# Patient Record
Sex: Male | Born: 1950 | Race: White | Hispanic: No | Marital: Married | Smoking: Never smoker
Health system: Southern US, Community
[De-identification: ages and names within clinical notes are randomized; demographics above are authoritative.]

## PROBLEM LIST (undated history)

## (undated) DIAGNOSIS — C4491 Basal cell carcinoma of skin, unspecified: Secondary | ICD-10-CM

## (undated) DIAGNOSIS — I2510 Atherosclerotic heart disease of native coronary artery without angina pectoris: Secondary | ICD-10-CM

## (undated) DIAGNOSIS — K219 Gastro-esophageal reflux disease without esophagitis: Secondary | ICD-10-CM

## (undated) DIAGNOSIS — Z87898 Personal history of other specified conditions: Secondary | ICD-10-CM

## (undated) DIAGNOSIS — R112 Nausea with vomiting, unspecified: Secondary | ICD-10-CM

## (undated) DIAGNOSIS — N281 Cyst of kidney, acquired: Secondary | ICD-10-CM

## (undated) DIAGNOSIS — M1990 Unspecified osteoarthritis, unspecified site: Secondary | ICD-10-CM

## (undated) DIAGNOSIS — I10 Essential (primary) hypertension: Secondary | ICD-10-CM

## (undated) DIAGNOSIS — I712 Thoracic aortic aneurysm, without rupture, unspecified: Secondary | ICD-10-CM

## (undated) DIAGNOSIS — D126 Benign neoplasm of colon, unspecified: Secondary | ICD-10-CM

## (undated) DIAGNOSIS — G4730 Sleep apnea, unspecified: Secondary | ICD-10-CM

## (undated) DIAGNOSIS — E119 Type 2 diabetes mellitus without complications: Secondary | ICD-10-CM

## (undated) DIAGNOSIS — K648 Other hemorrhoids: Secondary | ICD-10-CM

## (undated) DIAGNOSIS — K649 Unspecified hemorrhoids: Secondary | ICD-10-CM

## (undated) DIAGNOSIS — M545 Low back pain, unspecified: Secondary | ICD-10-CM

## (undated) DIAGNOSIS — N201 Calculus of ureter: Secondary | ICD-10-CM

## (undated) DIAGNOSIS — Z9889 Other specified postprocedural states: Secondary | ICD-10-CM

## (undated) DIAGNOSIS — E785 Hyperlipidemia, unspecified: Secondary | ICD-10-CM

## (undated) DIAGNOSIS — J189 Pneumonia, unspecified organism: Secondary | ICD-10-CM

## (undated) DIAGNOSIS — M5136 Other intervertebral disc degeneration, lumbar region: Secondary | ICD-10-CM

## (undated) DIAGNOSIS — J302 Other seasonal allergic rhinitis: Secondary | ICD-10-CM

## (undated) DIAGNOSIS — L02215 Cutaneous abscess of perineum: Secondary | ICD-10-CM

## (undated) DIAGNOSIS — R42 Dizziness and giddiness: Secondary | ICD-10-CM

## (undated) DIAGNOSIS — K602 Anal fissure, unspecified: Secondary | ICD-10-CM

## (undated) DIAGNOSIS — M479 Spondylosis, unspecified: Secondary | ICD-10-CM

## (undated) DIAGNOSIS — E669 Obesity, unspecified: Secondary | ICD-10-CM

## (undated) DIAGNOSIS — Z9289 Personal history of other medical treatment: Secondary | ICD-10-CM

## (undated) DIAGNOSIS — M51369 Other intervertebral disc degeneration, lumbar region without mention of lumbar back pain or lower extremity pain: Secondary | ICD-10-CM

## (undated) DIAGNOSIS — H3500 Unspecified background retinopathy: Secondary | ICD-10-CM

## (undated) HISTORY — PX: KNEE ARTHROSCOPY: SUR90

## (undated) HISTORY — DX: Anal fissure, unspecified: K60.2

## (undated) HISTORY — PX: LASIK: SHX215

## (undated) HISTORY — DX: Essential (primary) hypertension: I10

## (undated) HISTORY — DX: Personal history of other medical treatment: Z92.89

## (undated) HISTORY — DX: Thoracic aortic aneurysm, without rupture, unspecified: I71.20

## (undated) HISTORY — DX: Benign neoplasm of colon, unspecified: D12.6

## (undated) HISTORY — DX: Unspecified osteoarthritis, unspecified site: M19.90

## (undated) HISTORY — DX: Hyperlipidemia, unspecified: E78.5

## (undated) HISTORY — PX: COLONOSCOPY: SHX174

## (undated) HISTORY — DX: Obesity, unspecified: E66.9

## (undated) HISTORY — DX: Type 2 diabetes mellitus without complications: E11.9

## (undated) HISTORY — DX: Gastro-esophageal reflux disease without esophagitis: K21.9

## (undated) HISTORY — DX: Other hemorrhoids: K64.8

## (undated) HISTORY — DX: Atherosclerotic heart disease of native coronary artery without angina pectoris: I25.10

## (undated) HISTORY — DX: Thoracic aortic aneurysm, without rupture: I71.2

## (undated) HISTORY — DX: Sleep apnea, unspecified: G47.30

---

## 1979-03-12 HISTORY — PX: OTHER SURGICAL HISTORY: SHX169

## 1988-03-11 HISTORY — PX: LUMBAR SPINE SURGERY: SHX701

## 1997-10-06 ENCOUNTER — Ambulatory Visit (HOSPITAL_BASED_OUTPATIENT_CLINIC_OR_DEPARTMENT_OTHER): Admission: RE | Admit: 1997-10-06 | Discharge: 1997-10-06 | Payer: Self-pay | Admitting: *Deleted

## 1998-02-09 ENCOUNTER — Ambulatory Visit (HOSPITAL_BASED_OUTPATIENT_CLINIC_OR_DEPARTMENT_OTHER): Admission: RE | Admit: 1998-02-09 | Discharge: 1998-02-09 | Payer: Self-pay | Admitting: *Deleted

## 1998-06-05 ENCOUNTER — Ambulatory Visit (HOSPITAL_BASED_OUTPATIENT_CLINIC_OR_DEPARTMENT_OTHER): Admission: RE | Admit: 1998-06-05 | Discharge: 1998-06-05 | Payer: Self-pay | Admitting: *Deleted

## 1999-03-12 HISTORY — PX: NASAL SINUS SURGERY: SHX719

## 1999-06-07 ENCOUNTER — Ambulatory Visit: Admission: RE | Admit: 1999-06-07 | Discharge: 1999-06-07 | Payer: Self-pay | Admitting: Internal Medicine

## 2000-02-02 ENCOUNTER — Emergency Department (HOSPITAL_COMMUNITY): Admission: EM | Admit: 2000-02-02 | Discharge: 2000-02-02 | Payer: Self-pay | Admitting: Emergency Medicine

## 2000-02-02 ENCOUNTER — Encounter: Payer: Self-pay | Admitting: Emergency Medicine

## 2000-03-11 HISTORY — PX: NASAL SINUS SURGERY: SHX719

## 2002-01-11 ENCOUNTER — Ambulatory Visit (HOSPITAL_COMMUNITY): Admission: RE | Admit: 2002-01-11 | Discharge: 2002-01-11 | Payer: Self-pay | Admitting: Cardiology

## 2002-03-11 HISTORY — PX: CARDIAC CATHETERIZATION: SHX172

## 2005-09-08 DIAGNOSIS — J189 Pneumonia, unspecified organism: Secondary | ICD-10-CM

## 2005-09-08 HISTORY — DX: Pneumonia, unspecified organism: J18.9

## 2005-09-20 ENCOUNTER — Encounter (INDEPENDENT_AMBULATORY_CARE_PROVIDER_SITE_OTHER): Payer: Self-pay | Admitting: *Deleted

## 2005-09-20 ENCOUNTER — Emergency Department (HOSPITAL_COMMUNITY): Admission: EM | Admit: 2005-09-20 | Discharge: 2005-09-20 | Payer: Self-pay | Admitting: Emergency Medicine

## 2005-10-09 ENCOUNTER — Encounter (INDEPENDENT_AMBULATORY_CARE_PROVIDER_SITE_OTHER): Payer: Self-pay | Admitting: *Deleted

## 2005-10-09 ENCOUNTER — Encounter: Admission: RE | Admit: 2005-10-09 | Discharge: 2005-10-09 | Payer: Self-pay | Admitting: Cardiology

## 2009-12-27 ENCOUNTER — Encounter: Payer: Self-pay | Admitting: Internal Medicine

## 2009-12-28 ENCOUNTER — Ambulatory Visit: Payer: Self-pay | Admitting: Cardiology

## 2010-01-17 ENCOUNTER — Encounter: Payer: Self-pay | Admitting: Internal Medicine

## 2010-01-17 ENCOUNTER — Telehealth: Payer: Self-pay | Admitting: Internal Medicine

## 2010-01-31 ENCOUNTER — Ambulatory Visit: Payer: Self-pay | Admitting: Internal Medicine

## 2010-01-31 DIAGNOSIS — I2510 Atherosclerotic heart disease of native coronary artery without angina pectoris: Secondary | ICD-10-CM

## 2010-01-31 DIAGNOSIS — E119 Type 2 diabetes mellitus without complications: Secondary | ICD-10-CM

## 2010-02-05 ENCOUNTER — Encounter: Payer: Self-pay | Admitting: Internal Medicine

## 2010-02-15 ENCOUNTER — Ambulatory Visit: Payer: Self-pay | Admitting: Internal Medicine

## 2010-02-17 ENCOUNTER — Encounter: Payer: Self-pay | Admitting: Internal Medicine

## 2010-04-01 ENCOUNTER — Encounter: Payer: Self-pay | Admitting: Cardiology

## 2010-04-10 NOTE — Letter (Signed)
Summary: [facility] Instructions  [facility]  [address] [place], [place] [address]   Phone: [phone]  Fax: [phone]       James Rodgers    16-May-1950    MRN: [id number]        Procedure Day /Date:THURSDAY 02/15/10     Arrival Time:8:00 AM     Procedure Time:9:00 AM     Location of Procedure:                    [facility] ([facility]th [facility])               PREPARATION FOR COLONOSCOPY WITH MOVIPREP   Starting 5 days prior to your procedure 02/10/10 do not eat nuts, seeds, popcorn, corn, beans, peas,  salads, or any raw vegetables.  Do not take any fiber supplements (e.g. Metamucil, Citrucel, and Benefiber).  THE DAY BEFORE YOUR PROCEDURE         DATE:02/14/10 ONG:[id number]  1.  Drink clear liquids the entire day-NO SOLID FOOD  2.  Do not drink anything colored red or purple.  Avoid juices with pulp.  No orange juice.  3.  Drink at least 64 oz. (8 glasses) of fluid/clear liquids during the day to prevent dehydration and help the prep work efficiently.  CLEAR LIQUIDS INCLUDE: Water Jello Ice Popsicles Tea (sugar ok, no milk/cream) Powdered fruit flavored drinks Coffee (sugar ok, no milk/cream) Gatorade Juice: apple, white grape, white cranberry  Lemonade Clear bullion, consomm, broth Carbonated beverages (any kind) Strained chicken noodle soup Hard Candy                             4.  In the morning, mix first dose of MoviPrep solution:    Empty 1 Pouch A and 1 Pouch B into the disposable container    Add lukewarm drinking water to the top line of the container. Mix to dissolve    Refrigerate (mixed solution should be used within 24 hrs)  5.  Begin drinking the prep at 5:00 p.m. The MoviPrep container is divided by 4 marks.   Every 15 minutes drink the solution down to the next mark (approximately 8 oz) until the full liter is complete.   6.  Follow completed prep with 16 oz of clear liquid of your choice (Nothing red or  purple).  Continue to drink clear liquids until bedtime.  7.  Before going to bed, mix second dose of MoviPrep solution:    Empty 1 Pouch A and 1 Pouch B into the disposable container    Add lukewarm drinking water to the top line of the container. Mix to dissolve    Refrigerate  THE DAY OF YOUR PROCEDURE      DATE: 02/15/10 DAY: THURSDAY  Beginning at 4:00 a.m. (5 hours before procedure):         1. Every 15 minutes, drink the solution down to the next mark (approx 8 oz) until the full liter is complete.  2. Follow completed prep with 16 oz. of clear liquid of your choice.    3. You may drink clear liquids until 7:00 AM (2 HOURS BEFORE PROCEDURE).   MEDICATION INSTRUCTIONS  Unless otherwise instructed, you should take regular prescription medications with a small sip of water   as early as possible the morning of your procedure.  Diabetic patients - see separate instructions.  Stop taking Plavix  on 03/02/10 (5days  before procedure).            OTHER INSTRUCTIONS  You will need a responsible adult at least 61 years of age to accompany you and drive you home.   This person must remain in the waiting room during your procedure.  Wear loose fitting clothing that is easily removed.  Leave jewelry and other valuables at home.  However, you may wish to bring a book to read or  an iPod/MP3 player to listen to music as you wait for your procedure to start.  Remove all body piercing jewelry and leave at home.  Total time from sign-in until discharge is approximately 2-3 hours.  You should go home directly after your procedure and rest.  You can resume normal activities the  day after your procedure.  The day of your procedure you should not:   Drive   Make legal decisions   Operate machinery   Drink alcohol   Return to work  You will receive specific instructions about eating, activities and medications before you leave.    The above instructions have been  reviewed and explained to me by   _______________________    I fully understand and can verbalize these instructions _____________________________ Date _________

## 2010-04-10 NOTE — Letter (Signed)
Summary: New Patient letter  [facility]  [address] [place], [place] [address]   Phone: [phone]  Fax: [phone]       01/17/2010 MRN: [id number]  [facility] [address] [place], [place]  [address]  Dear Mr. SIMONIAN,  Welcome to the Gastroenterology Division at [facility].    You are scheduled to see Dr. Marina Goodell on 01/31/10 at 8:45 am on the 3rd floor at [facility], [address]  We ask that you try to arrive at our office 15 minutes prior to your appointment time to allow for check-in.  We would like you to complete the enclosed self-administered evaluation form prior to your visit and bring it with you on the day of your appointment.  We will review it with you.  Also, please bring a complete list of all your medications or, if you prefer, bring the medication bottles and we will list them.  Please bring your insurance card so that we may make a copy of it.  If your insurance requires a referral to see a specialist, please bring your referral form from your primary care physician.  Co-payments are due at the time of your visit and may be paid by cash, check or credit card.     Your office visit will consist of a consult with your physician (includes a physical exam), any laboratory testing he/she may order, scheduling of any necessary diagnostic testing (e.g. x-ray, ultrasound, CT-scan), and scheduling of a procedure (e.g. Endoscopy, Colonoscopy) if required.  Please allow enough time on your schedule to allow for any/all of these possibilities.    If you cannot keep your appointment, please call [phone] to cancel or reschedule prior to your appointment date.  This allows [place] the opportunity to schedule an appointment for another patient in need of care.  If you do not cancel or reschedule by 5 p.m. the business day prior to your appointment date, you will be charged a $50.00 late cancellation/no-show fee.    Thank you for  choosing [facility] for your medical needs.  We appreciate the opportunity to care for you.  Please visit [place] at our website  to learn more about our practice.                     Sincerely,                                                             The Gastroenterology Division

## 2010-04-10 NOTE — Letter (Signed)
Summary: Anticoag  Anticoag   Imported By: Lester  02/12/2010 10:05:46  _____________________________________________________________________  External Attachment:    Type:   Image     Comment:   External Document

## 2010-04-10 NOTE — Assessment & Plan Note (Signed)
Summary: Screening colonoscopy (never had one, on Plavix)   History of Present Illness Visit Type: Initial Consult Primary GI MD: Yancey Flemings MD Primary Provider: Jarome Matin, MD Requesting Provider: Jarome Matin, MD Chief Complaint: Consult Colonoscopy, Pt is on Plavix and denies any GI sx. History of Present Illness:   60 year old white male, retired Biochemist, clinical of the [employer], with a history of hypertension, hyperlipidemia, obesity, type 2 diabetes mellitus, sleep apnea, and coronary artery disease. He also has a history of GERD. He presents today regarding screening colonoscopy. Previous sigmoidoscopies with Dr. Idell Pickles reported as negative. No family history of colon cancer. No active GI symptoms. He has been on Plavix for greater than one year after having undergone a cardiac catheterization with coronary artery disease identified. No stent. Also on aspirin. His cardiologist is Dr. Deborah Chalk. Reports prior EGD for GERD. Denies Barrett's. On metformin for diabetes. Review of outside laboratories from May 2011 showed normal Hemoccult testing.   GI Review of Systems    Reports acid reflux.      Denies abdominal pain, belching, bloating, chest pain, dysphagia with liquids, dysphagia with solids, heartburn, loss of appetite, nausea, vomiting, vomiting blood, weight loss, and  weight gain.      Reports hemorrhoids.     Denies anal fissure, black tarry stools, change in bowel habit, constipation, diarrhea, diverticulosis, fecal incontinence, heme positive stool, irritable bowel syndrome, jaundice, light color stool, liver problems, rectal bleeding, and  rectal pain. Preventive Screening-Counseling & Management      Drug Use:  no.      Current Medications (verified): 1)  Plavix 75 Mg Tabs (Clopidogrel Bisulfate) .[name].. 1 By Mouth Once Daily 2)  Norvasc 5 Mg Tabs (Amlodipine Besylate) .[name].. 1 By Mouth Once Daily 3)  Niaspan 1000 Mg Cr-Tabs (Niacin (Antihyperlipidemic)) .... 2  Capsules By Mouth Once Daily 4)  Toprol Xl 100 Mg Xr24h-Tab (Metoprolol Succinate) .... One Tablet By Mouth Once Daily 5)  Benicar 20 Mg Tabs (Olmesartan Medoxomil) .... One Tablet By Mouth Once Daily 6)  Lipitor 20 Mg Tabs (Atorvastatin Calcium) .... One Tablet By Mouth Once Daily 7)  Metformin Hcl 500 Mg Tabs (Metformin Hcl) .... One Tablet By Mouth Once Daily 8)  Nexium 40 Mg Cpdr (Esomeprazole Magnesium) .... One Capsule By Mouth Once Daily 9)  Allegra Allergy 180 Mg Tabs (Fexofenadine Hcl) .... One Capsule By Mouth Once Daily 10)  Aspirin 325 Mg  Tabs (Aspirin) .... One Tablet By Mouth Once Daily  Allergies (verified): No Known Drug Allergies  Past History:  Past Medical History: Coronary Artery Disease Hyperlipidemia Obesity Hypertension Anal Fissure Arthritis Diabetes Type 2 GERD Sleep Apnea  Past Surgical History: Sinus Surgery Back Surgery Anal fissure surgery  Family History: Family History of Heart Disease: Father Family History of Diabetes: Grandmother  Social History: Married Occupation: Fireman Retired Patient has never smoked.  Alcohol Use - no Daily Caffeine Use Illicit Drug Use - no Drug Use:  no  Review of Systems       The patient complains of allergy/sinus, arthritis/joint pain, and itching.  The patient denies anemia, anxiety-new, back pain, blood in urine, breast changes/lumps, change in vision, confusion, cough, coughing up blood, depression-new, fainting, fatigue, fever, headaches-new, hearing problems, heart murmur, heart rhythm changes, menstrual pain, muscle pains/cramps, night sweats, nosebleeds, pregnancy symptoms, shortness of breath, skin rash, sleeping problems, sore throat, swelling of feet/legs, swollen lymph glands, thirst - excessive , urination - excessive , urination changes/pain, urine leakage, vision changes, and voice change.  Vital Signs:  Patient profile:   60 year old male Height:      77 inches Weight:      275.38  pounds BMI:     32.77 Pulse rate:   80 / minute Pulse rhythm:   regular BP sitting:   132 / 72  (right arm) Cuff size:   regular  Vitals Entered By: Christie Nottingham CMA Duncan Dull) (January 31, 2010 8:51 AM)  Physical Exam  General:  Well developed, well nourished, no acute distress. Head:  Normocephalic and atraumatic. Eyes:  PERRLA, no icterus. Nose:  No deformity, discharge,  or lesions. Mouth:  No deformity or lesions, dentition normal. Neck:  Supple; no masses or thyromegaly. Lungs:  Clear throughout to auscultation. Heart:  Regular rate and rhythm; no murmurs, rubs,  or bruits. Abdomen:  Soft, nontender and nondistended. No masses, hepatosplenomegaly or hernias noted. Normal bowel sounds. Rectal:  deferred until colonoscopy Prostate:  deferred until colonoscopy Msk:  Symmetrical with no gross deformities. Normal posture. Pulses:  Normal pulses noted. Extremities:  No clubbing, cyanosis, edema or deformities noted. Neurologic:  Alert and  oriented x4;  grossly normal neurologically. Skin:  Intact without significant lesions or rashes. Psych:  Alert and cooperative. Normal mood and affect.   Impression & Recommendations:  Problem # 1:  SPECIAL SCREENING FOR MALIGNANT NEOPLASMS COLON (ICD-V76.51) the patient is an appropriate candidate for screening colonoscopy without contraindication. He is at higher than baseline risk due to his comorbidities including sleep apnea, coronary artery disease requiring Plavix, and diabetes requiring medication adjustments for the exam. The nature of the procedure as well as the risks, benefits, and alternatives were reviewed in detail. He understood and agreed to proceed. Movi prep prescribed. We discussed the pros and cons of proceeding with colonoscopy on Plavix therapy versus holding Plavix therapy. He is interested in holding Plavix therapy, and stent on aspirin, if okay with his cardiologist. We have sent Dr. Deborah Chalk a letter for his review in  this regard. Also, he has been instructed to hold diabetic medications the day of the procedure.  Problem # 2:  CAD (ICD-414.00) we will discuss with the patient's cardiologist feasibility of holding Plavix for approximate 5 days prior to the procedure and as much as 2 weeks thereafter (potentially)  Problem # 3:  DM (ICD-250.00) hold diabetic medications the day of the exam to avoid unwanted hypoglycemia. We will monitor his blood sugar immediately before and after the procedure.  Other Orders: Colonoscopy (Colon)  Patient Instructions: 1)  Colonoscopy [facility] 02/15/10 9:00 am arrive at 8:00 am 2)  Movi prep instructions given to patient. 3)  Movi prep Rx. sent to pharmacy. 4)  Hold Metformin day of procedure. 5)  Hold Plavix x 5 days prior and stay on Aspirin. 6)  We will send Dr. Deborah Chalk a letter to ok this and will contact you once we hear back. 7)  Copy sent to : Jarome Matin, MD  Delfin Edis, MD 8)  The medication list was reviewed and reconciled.  All changed / newly prescribed medications were explained.  A complete medication list was provided to the patient / caregiver. Prescriptions: MOVIPREP 100 GM  SOLR (PEG-KCL-NACL-NASULF-NA ASC-C) As per prep instructions.  #1 x 0   Entered by:   Milford Cage NCMA   Authorized by:   Hilarie Fredrickson MD   Signed by:   Milford Cage NCMA on 01/31/2010   Method used:   Electronically to        [facility] [phone]* (retail)       [address]       [place], [place]  [address]       Ph: [phone] or [phone]       Fax: [phone]   RxID:   [id number]

## 2010-04-10 NOTE — Letter (Signed)
Summary: Diabetic Instructions  [facility]  [address] [place], [place] [address]   Phone: [phone]  Fax: [phone]    James Rodgers [birth year] MRN: [id number]   x  ORAL DIABETIC MEDICATION INSTRUCTIONS  The day before your procedure:   Take your diabetic pill as you do normally  The day of your procedure:   Do not take your diabetic pill    We will check your blood sugar levels during the admission process and again in Recovery before discharging you home  ________________________________________________________________________  _  _   INSULIN (LONG ACTING) MEDICATION INSTRUCTIONS (Lantus, NPH, 70/30, Humulin, Novolin-N)   The day before your procedure:   Take  your regular evening dose    The day of your procedure:   Do not take your morning dose    _  _   INSULIN (SHORT ACTING) MEDICATION INSTRUCTIONS (Regular, Humulog, Novolog)   The day before your procedure:   Do not take your evening dose   The day of your procedure:   Do not take your morning dose   _  _   INSULIN PUMP MEDICATION INSTRUCTIONS  We will contact the physician managing your diabetic care for written dosage instructions for the day before your procedure and the day of your procedure.  Once we have received the instructions, we will contact you.

## 2010-04-10 NOTE — Letter (Signed)
Summary: Anticoagulation Modification Letter  [facility]  [address] [place], [place] [address]   Phone: [phone]  Fax: [phone]    January 31, 2010  Re:    James Rodgers DOB:    1950/11/28 MRN:    [id number]    Dear Dr. Deborah Chalk:  We have scheduled the above patient for a colonoscopy  procedure. Our records show that  he is on anticoagulation therapy. Please advise as to how long the patient may come off their therapy of Plavix prior to the scheduled procedure(s) on 02/15/10.   Please fax back/or route the completed form to James Rodgers, CMA  at [phone].  Thank you for your help with this matter.  Sincerely,    Wilhemina Bonito. Marina Goodell, MD   Physician Recommendation:  Hold Plavix 5 days prior ________________

## 2010-04-10 NOTE — Progress Notes (Signed)
Summary: previsit letter ?'s  Phone Note Call from Patient Call back at Home Phone [phone]   Caller: Patient Call For: Dr. Marina Goodell Reason for Call: Talk to Nurse Summary of Call: previsit letter ?'s Initial call taken by: Vallarie Mare,  January 17, 2010 8:27 AM  Follow-up for Phone Call        Pt. is on Plavix and has never been seen here.  Never has had a colonoscopy.  I cxl'd Previsit appt. and scheduled him to see Dr. Marina Goodell on 01/31/10.  Kept his 02/15/10 appt. for Colonoscopy for now until seen by Dr. Marina Goodell.  Appt. letter mailed to patient. Follow-up by: Milford Cage NCMA,  January 17, 2010 9:46 AM

## 2010-04-10 NOTE — Letter (Signed)
Summary: Pre Visit Letter Revised  [facility]  [address] [place], [place] [address]   Phone: [phone]  Fax: [phone]        12/27/2009 MRN: [id number]  [facility] [address] [place], [place]  [address]             Procedure Date:  12-8 at 9am   Welcome to the Gastroenterology Division at [facility].    You are scheduled to see a nurse for your pre-procedure visit on 02-05-10 at 10am on the 3rd floor at [facility], [address].  We ask that you try to arrive at our office 15 minutes prior to your appointment time to allow for check-in.  Please take a minute to review the attached form.  If you answer "Yes" to one or more of the questions on the first page, we ask that you call the person listed at your earliest opportunity.  If you answer "No" to all of the questions, please complete the rest of the form and bring it to your appointment.    Your nurse visit will consist of discussing your medical and surgical history, your immediate family medical history, and your medications.   If you are unable to list all of your medications on the form, please bring the medication bottles to your appointment and we will list them.  We will need to be aware of both prescribed and over the counter drugs.  We will need to know exact dosage information as well.    Please be prepared to read and sign documents such as consent forms, a financial agreement, and acknowledgement forms.  If necessary, and with your consent, a friend or relative is welcome to sit-in on the nurse visit with you.  Please bring your insurance card so that we may make a copy of it.  If your insurance requires a referral to see a specialist, please bring your referral form from your primary care physician.  No co-pay is required for this nurse visit.     If you cannot keep your appointment, please call [phone] to cancel or reschedule prior to your appointment date.   This allows [place] the opportunity to schedule an appointment for another patient in need of care.    Thank you for choosing [facility] for your medical needs.  We appreciate the opportunity to care for you.  Please visit [place] at our website  to learn more about our practice.  Sincerely, The Gastroenterology Division

## 2010-04-12 NOTE — Procedures (Signed)
Summary: Colonoscopy  Patient: James Rodgers Note: All result statuses are Final unless otherwise noted.  Tests: (1) Colonoscopy (COL)   COL Colonoscopy           DONE     [facility]     [address]     [place], [place]  [address]           COLONOSCOPY PROCEDURE REPORT           PATIENT:  James Rodgers, James Rodgers  MR#:  [id number]     BIRTHDATE:  1950/10/29, 59 yrs. old  GENDER:  male     ENDOSCOPIST:  Wilhemina Bonito. Eda Keys, MD     REF. BY:  Jarome Matin, M.D.     PROCEDURE DATE:  02/15/2010     PROCEDURE:  Colonoscopy with snare polypectomy x 4     ASA CLASS:  Class II     INDICATIONS:  Routine Risk Screening     MEDICATIONS:   Fentanyl 100 mcg IV, Versed 10 mg IV, Benadryl 25     mg IV           DESCRIPTION OF PROCEDURE:   After the risks benefits and     alternatives of the procedure were thoroughly explained, informed     consent was obtained.  Digital rectal exam was performed and     revealed no abnormalities.   The LB [id number] endoscope     was introduced through the anus and advanced to the cecum, which     was identified by both the appendix and ileocecal valve, without     limitations.Time to cecum = 5:00 min. The quality of the prep was     good, using MoviPrep.  The instrument was then slowly withdrawn     (time = 22:07 min) as the colon was fully examined.     <<PROCEDUREIMAGES>>           FINDINGS:  Four polyps were found - 1mm in the cecum, 55mm,5mm in     ascending, and 3mm in descending colon. Polyps were snared without     cautery. Retrieval was successful.   Moderate diverticulosis was     found throughout the colon.   Retroflexed views in the rectum     revealed internal hemorrhoids.    The scope was then withdrawn     from the patient and the procedure completed.           COMPLICATIONS:  None     ENDOSCOPIC IMPRESSION:     1) Four polyps - removed     2) Moderate diverticulosis throughout the colon     3) Internal hemorrhoids]           RECOMMENDATIONS:     1) Follow up colonoscopy in 3 years if 3 or more adenomas;     otherwise  5 years           ______________________________     Wilhemina Bonito. Eda Keys, MD           CC:  Jarome Matin, MD; The Patient           n.     eSIGNED:   Wilhemina Bonito. Eda Keys at 02/15/2010 10:17 AM           [place], [place], [id number]  Note: An exclamation mark (!) indicates a result that was not dispersed into the flowsheet. Document Creation Date: 02/15/2010 10:17 AM _______________________________________________________________________  (1) Order result status: Final Collection  or observation date-time: 02/15/2010 10:04 Requested date-time:  Receipt date-time:  Reported date-time:  Referring Physician:   Ordering Physician: Fransico Setters [phone]) Specimen Source:  Source: Launa Grill Order Number: [id number] Lab site:   Appended Document: Colonoscopy recall 5 yrs     Procedures Next Due Date:    Colonoscopy: 02/2015

## 2010-04-12 NOTE — Letter (Signed)
Summary: Patient Notice- Polyp Results   G[facility]enter[facility]  [address] [place], [place] [address]   Phone: [phone]  Fax: [phone]        February 17, 2010 MRN: [id number]    James Rodgers [address] [place], [place]  [address]    Dear [name],  I am pleased to inform you that the colon polyp(s) removed during your recent colonoscopy was (were) found to be benign (no cancer detected) upon pathologic examination.  I recommend you have a repeat colonoscopy examination in 5 years to look for recurrent polyps, as having colon polyps increases your risk for having recurrent polyps or even colon cancer in the future.  Should you develop new or worsening symptoms of abdominal pain, bowel habit changes or bleeding from the rectum or bowels, please schedule an evaluation with either your primary care physician or with me.  Additional information/recommendations:  __ No further action with gastroenterology is needed at this time. Please      follow-up with your primary care physician for your other healthcare      needs.   Please call us if you are having persistent problems or have questions about your condition that have not been fully answered at this time.  Sincerely,  Hilarie Fredrickson MD  This letter has been electronically signed by your physician.  Appended Document: Patient Notice- Polyp Results Letter mailed

## 2010-05-22 LAB — GLUCOSE, CAPILLARY
Glucose-Capillary: 124 mg/dL — ABNORMAL HIGH (ref 70–99)
Glucose-Capillary: 140 mg/dL — ABNORMAL HIGH (ref 70–99)

## 2010-07-27 NOTE — Consult Note (Signed)
James Rodgers, James Rodgers            ACCOUNT NO.:  [id number]   MEDICAL RECORD NO.:  [id number]          PATIENT TYPE:  EMS   LOCATION:  MAJO                         FACILITY:  [facility]   PHYSICIAN:  Colleen Can. 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  Diarrhea began on  July 3rd and July 4th.  He had persistence of that up to 3-4 stools per day  and another colleague had diarrhea for 2 days.  He initially was felt to be  better but was seen in urgent medical clinic and given Cipro.  Initially he  was better the next day and then yesterday had temperatures to 102 with low  back pain and increasing indigestion.  He has had no diarrhea the last 2  days.  He has known coronary artery disease with a cath in 2003 showing 50-  60% stenosis of the LAD.  His last stress Cardiolite study in February of  2006 with ejection fraction of 57% with no ischemia.  He has had lipid  abnormality, obesity and history of glucose intolerance.   CURRENT MEDICATIONS:  1.  Folic acid.  2.  Plavix 75 a day.  3.  Nexium.  4.  Altace.  5.  Lipitor.  6.  Toprol.  7.  Claritin.  8.  Nasonex.   ALLERGIES:  None.   PHYSICAL EXAMINATION:  Vital signs: Temperature is 97, blood pressure  120/70, heart rate is 80.  HEENT: Shows he is flushed.  Lungs: Reasonably clear.  Heart: Regular rate and rhythm.  Abdomen: Normal.  Bowel sounds are soft.  No rebound.  No suprapubic  tenderness.  Rectal: Shows some tenderness.  Stool was pending.  Extremities: Without edema.   EKG shows nonspecific ST and T wave changes.   PERTINENT LAB:  White count is 15,000, hematocrit is 45, platelets 139, 82%  segs.  Urinalysis was negative.  Chemistries remarkable for a glucose of  128, BUN 12, calcium and total  protein were normal.  LFTs were normal.   IMPRESSION:  1.  Recent diarrhea now with back pain.  2.  Leukocytosis and recurrent fever in the setting of a course of Cipro.   PLAN:  Will arrange for a CT scan of the abdomen to rule out abscess.  Will  continue the Cipro if there is no abscess.  Otherwise, if there looks like  there is abscess or issues relating to the colon or abdominal visceral, will  need to have surgical consultation.      Colleen Can. Deborah Chalk, M.D.  Electronically Signed     SNT/MEDQ  D:  09/20/2005  T:  09/21/2005  Job:  [id number]

## 2010-07-27 NOTE — H&P (Signed)
NAME:  James Rodgers, James Rodgers                      ACCOUNT NO.:  [id number]   MEDICAL RECORD NO.:  [id number]                   PATIENT TYPE:  OIB   LOCATION:  2858                                 FACILITY:  [facility]   PHYSICIAN:  Colleen Can. Deborah Chalk, M.D.            DATE OF BIRTH:  [birth year]   DATE OF ADMISSION:  01/11/2002  DATE OF DISCHARGE:                                HISTORY & PHYSICAL   DATE OF BIRTH:  02-Mar-1951   HISTORY OF PRESENT ILLNESS:  Mr. 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 He presents with a four day  episode of mid sternal chest pain that has been off and on. It tends to be  somewhat of a dull aching pain and has some burning that is on and off. It  is low level at grade III. It is not aggravated by exertion. It has been  somewhat of a nuisance type of discomfort over these three to four days. He  notes that he has felt somewhat weak and washed out but has had no  diaphoresis, nausea, or significant shortness of breath. He has a history of  hypertension for the last 3-4 years. He has been on Norvasc and previously  was on Atenolol but after having weight loss, that was discontinued. He has  been on Niaspan because of hypercholesterolemia and low HDL cholesterol. He  has a history of nasal allergies. He also has a history of sleep apnea.   FAMILY HISTORY:  Father died at age 64 years from heart disease. He was a  heavy smoker and an alcoholic. His mother is living at age 3 years. He has  one sister, age 3 years with hypertension. He has one son and one daughter,  ages 29 and 30.   SOCIAL HISTORY:  He works for the [employer]. He does not use tobacco  or alcohol. He lives at home with his wife and son.   PAST SURGICAL HISTORY:  He has sinus surgery in 1999 and 2000. He had back  surgery in 1988 and 1991. He had an anal fistula in 1982.   PAST MEDICAL HISTORY:  He was hospitalized for a sleep apnea study in  2001.   ALLERGIES:  No known drug allergies.   CURRENT MEDICATIONS:  1. Norvasc 5 mg each day.  2. Niaspan 1500 mg each day.  3. Aspirin one each day.  4. Quinidex 10 mg each day.  5. Nasonex each day.  6. Multivitamin each day.   REVIEW OF SYSTEMS:  Remarkable for sinus difficulties and allergies. GI and  GU are negative. He does have chronic heartburn but clearly, this is  different from the current symptoms. There is no arthritic problems. 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   PHYSICAL EXAMINATION:  VITAL SIGNS: Weight 270 pounds. Blood pressure 114/80  sitting and 124/90 standing. Pulse  68 and regular.  HEENT: Fundi benign. Oropharynx is full.  NECK: Supple without bruits.  LUNGS: Clear.  HEART: Regular rate and rhythm.  ABDOMEN: Soft and nontender.  EXTREMITIES: Without edema.   DIAGNOSTIC STUDIES:  EKG is basically normal.   IMPRESSION:  1. Atypical chest pain.  2. Hypertension.  3. Hypercholesterolemia.  4. Positive family history of heart disease.   DISCUSSION:  In light of the recurrent nature of these symptoms, we will  admit him for cardiac catheterization and coronary arteriograms. Procedure,  risks and benefits have been explained to the patient had he is willing to  proceed.                                                Colleen Can. Deborah Chalk, M.D.    SNT/MEDQ  D:  01/11/2002  T:  01/11/2002  Job:  [id number]   cc:   Miguel Aschoff, M.D.  [address]  [place]  [place]  [address]  Fax: [phone]

## 2010-07-27 NOTE — Cardiovascular Report (Signed)
NAME:  James, Rodgers                      ACCOUNT NO.:  [id number]   MEDICAL RECORD NO.:  [id number]                   PATIENT TYPE:  OIB   LOCATION:  2858                                 FACILITY:  [facility]   PHYSICIAN:  Colleen Can. Deborah Chalk, M.D.            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  He has  had four days of intermittent substernal chest discomfort and weakness.  EKG  was basically unremarkable.  He is referred for evaluation.  Pain has tended  to wax and wane.   PROCEDURE:  Left heart catheterization with selective coronary angiography,  left ventricular angiography, and Perclose.   TYPE AND SITE OF ENTRY:  Percutaneous right femoral artery.   CATHETERS:  The #6 [place] #4 curved Judkins right and left coronary  catheters, #6 French pigtail ventriculographic catheter.   CONTRAST MATERIAL:  Omnipaque.   MEDICATIONS GIVEN PRIOR TO PROCEDURE:  Valium 10 mg p.o.   MEDICATIONS GIVEN DURING PROCEDURE:  Versed 4 mg IV.   COMMENTS:  The patient tolerated the procedure well.  Ancef was given post  procedure.   HEMODYNAMIC DATA:  1. The aortic pressure was 114/73.  2. Left ventricular pressure was 116/5-10.  3. There was no aortic valve gradient noted on pullback.   ANGIOGRAPHIC DATA:  1. The left main coronary artery is normal.  2. Left anterior descending:  The left anterior descending has scattered     plaque.  There is a large bifurcating diagonal vessel.  There is a 50-60%     narrowing proximally and then in the mid portion, there is an area of     aneurysmal dilatation.  The vessel is free of significant obstructive     disease otherwise.  In the left anterior descending, there is an area of     40% narrowing and then another less significant area of aneurysmal     dilatation.  The mid portion of  the left anterior descending has somewhat     diffuse 30% narrowing.  There is a good bit of atherosclerotic plaque in     the left anterior descending itself with 30-50% segmental narrowing.  It     does not appear to be obstructive in nature.  3. Left circumflex:  The left circumflex is a moderate-sized vessel.  There     are irregularities but no significant focal disease.  4. Intermedius:  There is a moderate-sized intermedius coronary.  There are     scattered irregularities but no significant focal narrowing.  5. Right coronary artery:  The right coronary artery is a very large     dominant vessel.  There is slow antegrade flow and irregularities but     there is no significant focal obstructive disease present.  The posterior  descending has a 40% narrowing in its mid portion.  The large     posterolateral branch is free of significant obstructive disease.  6. Left ventricular angiogram:  Left ventricular angiogram is performed in     the RAO position.  Overall cardiac size and silhouette are normal.  The     global ejection fraction is 55-60%.  There is normal regional wall     motion.  There is no mitral regurgitation.  There is no intracavitary     filling defect.   OVERALL IMPRESSION:  1. Normal left ventricular function.  2. Moderately severe single-vessel coronary disease (50-60% diagonal and     left anterior descending with 40% right coronary artery narrowings and     irregularities in the left circumflex).   DISCUSSION:  It is felt that the patient can best be managed with an  aggressive medical regimen at this point in time.  He will need to be on an  aggressive antiplatelet regimen until we have these new symptoms basically  resolved.                                                   Colleen Can. Deborah Chalk, M.D.    SNT/MEDQ  D:  01/11/2002  T:  01/11/2002  Job:  [id number]   cc:   Miguel Aschoff, M.D.  [address], [address]  [place]  [place]   [address]  Fax: [phone]

## 2010-09-09 ENCOUNTER — Emergency Department (HOSPITAL_COMMUNITY): Payer: 59

## 2010-09-09 ENCOUNTER — Emergency Department (HOSPITAL_COMMUNITY)
Admission: EM | Admit: 2010-09-09 | Discharge: 2010-09-09 | Disposition: A | Discharge status: Home / Self Care | Payer: 59 | Attending: Emergency Medicine | Admitting: Emergency Medicine

## 2010-09-09 DIAGNOSIS — M51379 Other intervertebral disc degeneration, lumbosacral region without mention of lumbar back pain or lower extremity pain: Secondary | ICD-10-CM | POA: Insufficient documentation

## 2010-09-09 DIAGNOSIS — E119 Type 2 diabetes mellitus without complications: Secondary | ICD-10-CM | POA: Insufficient documentation

## 2010-09-09 DIAGNOSIS — M5137 Other intervertebral disc degeneration, lumbosacral region: Secondary | ICD-10-CM | POA: Insufficient documentation

## 2010-09-09 DIAGNOSIS — M549 Dorsalgia, unspecified: Secondary | ICD-10-CM | POA: Insufficient documentation

## 2010-09-09 DIAGNOSIS — I10 Essential (primary) hypertension: Secondary | ICD-10-CM | POA: Insufficient documentation

## 2010-09-28 ENCOUNTER — Encounter: Payer: Self-pay | Admitting: Cardiology

## 2010-10-02 ENCOUNTER — Encounter: Payer: Self-pay | Admitting: Cardiology

## 2010-10-02 ENCOUNTER — Ambulatory Visit (INDEPENDENT_AMBULATORY_CARE_PROVIDER_SITE_OTHER): Payer: 59 | Admitting: Cardiology

## 2010-10-02 VITALS — BP 146/96 | HR 75 | Ht 77.0 in | Wt 293.0 lb

## 2010-10-02 DIAGNOSIS — I10 Essential (primary) hypertension: Secondary | ICD-10-CM

## 2010-10-02 DIAGNOSIS — I2510 Atherosclerotic heart disease of native coronary artery without angina pectoris: Secondary | ICD-10-CM

## 2010-10-02 DIAGNOSIS — E785 Hyperlipidemia, unspecified: Secondary | ICD-10-CM

## 2010-10-02 NOTE — Assessment & Plan Note (Signed)
Stable with no ischemic symptoms.  Would continue ASA and can continue Plavix.  He will use ASA 325 as 81 mg will not help the Niaspan-induced flushing as much.  Continue statin, beta blocker, ACEI.

## 2010-10-02 NOTE — Patient Instructions (Signed)
Your physician wants you to follow-up in: 1 year with Dr Shirlee Latch. (July 2013). You will receive a reminder letter in the mail two months in advance. If you don't receive a letter, please call our office to schedule the follow-up appointment.

## 2010-10-02 NOTE — Assessment & Plan Note (Signed)
BP is mildly elevated today.  Readings have been good at home.  I will continue current regimen for now.  I encouraged him to diet and exercise to work on weight loss.

## 2010-10-02 NOTE — Assessment & Plan Note (Signed)
Goal LDL < 70 with coronary disease.  Patient says that PCP recently increased Lipitor.  Will be getting repeat lipids soon.  Will have him ask that a copy be sent to this office.

## 2010-10-02 NOTE — Progress Notes (Signed)
PCP: Dr. Eloise Harman  60 yo with history of moderate nonobstructive CAD presents for cardiology followup.  He has been seen by Dr. Deborah Chalk in the past and is seen by me for the first time today.  He had a catheterization in 2003 with 50% LAD, 50-60% diagonal, and 40% RCA stenoses.  Last stress myoview was in 10/10 and showed no evidence for ischemia or infarction.  He has been doing well in general.  He is retired and enjoys Music therapist.  He does some walking for exercise as well.  No exertional dyspnea, no chest pain.  Main limitation is knee pain when he walks carrying his golf bag.  He has been tolerating Niaspan, taking full strength aspirin cuts the flushing.  BP is mildly elevated today but has been in the 120s-130s systolic when he checks it at home.   ECG: NSR, normal  PMH: 1. Osteoarthritis: Knees 2. Type II diabetes 3. GERD 4. Hyperlipidemia 5. CAD: LHC in 11/03 showed 50-60% D1, 50% LAD, 40% RCA stenoses.  Myoview (10/10): EF 63% with no perfusion defect.  6. History of sinus surgery.  7. History of back surgery, last in 1991.  8. OSA on CPAP.   SH: Married with 2 children. Retired IT sales professional.  Nonsmoker.  Lives in [place].   FH: Mother with MI at 4.   ROS: All systems reviewed and negative except as per HPI.   Current Outpatient Prescriptions  Medication Sig Dispense Refill  . aspirin 325 MG tablet Take 325 mg by mouth daily.        [name] atorvastatin (LIPITOR) 20 MG tablet Take 20 mg by mouth daily.        . clopidogrel (PLAVIX) 75 MG tablet Take 75 mg by mouth daily.        [name] esomeprazole (NEXIUM) 40 MG capsule Take 40 mg by mouth daily.        . fexofenadine (ALLEGRA) 180 MG tablet Take 180 mg by mouth daily.        . metFORMIN (GLUCOPHAGE) 500 MG tablet Take 500 mg by mouth daily.        . metoprolol (TOPROL-XL) 100 MG 24 hr tablet Take 100 mg by mouth daily.        . mometasone (NASONEX) 50 MCG/ACT nasal spray Place 2 sprays into the nose daily.        . niacin  (NIASPAN) 1000 MG CR tablet Take 2,000 mg by mouth daily.        [name] olmesartan (BENICAR) 20 MG tablet Take 20 mg by mouth daily.          BP 146/96  Pulse 75  Ht 6\' 5"  (1.956 m)  Wt 293 lb (132.904 kg)  BMI 34.74 kg/m2 General: NAD, overweight Neck: Thick, no JVD, no thyromegaly or thyroid nodule.  Lungs: Clear to auscultation bilaterally with normal respiratory effort. CV: Nondisplaced PMI.  Heart regular S1/S2, no S3, soft S4, no murmur.  No peripheral edema.  No carotid bruit.  Normal pedal pulses.  Abdomen: Soft, nontender, no hepatosplenomegaly, no distention.  Neurologic: Alert and oriented x 3.  Psych: Normal affect. Extremities: No clubbing or cyanosis.

## 2011-05-15 ENCOUNTER — Other Ambulatory Visit: Payer: Self-pay | Admitting: Nurse Practitioner

## 2011-05-15 NOTE — Telephone Encounter (Signed)
Refilled plavix and metoprolol

## 2011-10-04 ENCOUNTER — Ambulatory Visit (INDEPENDENT_AMBULATORY_CARE_PROVIDER_SITE_OTHER): Payer: 59 | Admitting: Cardiology

## 2011-10-04 ENCOUNTER — Encounter: Payer: Self-pay | Admitting: Cardiology

## 2011-10-04 VITALS — BP 124/82 | Ht 77.0 in | Wt 308.0 lb

## 2011-10-04 DIAGNOSIS — I10 Essential (primary) hypertension: Secondary | ICD-10-CM

## 2011-10-04 DIAGNOSIS — I2510 Atherosclerotic heart disease of native coronary artery without angina pectoris: Secondary | ICD-10-CM

## 2011-10-04 DIAGNOSIS — E785 Hyperlipidemia, unspecified: Secondary | ICD-10-CM

## 2011-10-04 MED ORDER — HYDROCHLOROTHIAZIDE 25 MG PO TABS: 25.0000 mg | ORAL_TABLET | Freq: Every day | ORAL | Status: DC

## 2011-10-04 NOTE — Patient Instructions (Addendum)
Start HCTZ(hydrochlorothiazide) 25mg  daily.  Your physician recommends that you return for a FASTING lipid profile /liver profile /BMET in about 1 week.  Take and record your blood pressure daily. Call the office in 2 weeks and give Dr Shirlee Latch the readings. [phone]  Your physician wants you to follow-up in: 1 year with Dr Shirlee Latch. (July 2014). You will receive a reminder letter in the mail two months in advance. If you don't receive a letter, please call our office to schedule the follow-up appointment.

## 2011-10-07 NOTE — Progress Notes (Signed)
Patient ID: James Rodgers, male   DOB: [birth year], 61 y.o.   MRN: [id number] PCP: Dr. Eloise Harman  61 yo with history of moderate nonobstructive CAD presents for cardiology followup.  He had a catheterization in 2003 with 50% LAD, 50-60% diagonal, and 40% RCA stenoses.  Last stress myoview was in 10/10 and showed no evidence for ischemia or infarction.  He has been having problems recently with low back pain and has not been exercising much.  He is working with physical therapy and does walk some on a treadmill.  Weight is up about 15 lbs.  No chest pain, no exertional dyspnea.  He has also noted that BP has been running high at home.  It is ok today but regularly runs in the 140s-150s when he checks his BP at home.  He notes an episode of palpitations one day when his HR was running 100s-120s.  He says that he had forgotten to take his Toprol XL the day before.    ECG: NSR, inferior and anterolateral T wave flattening.   PMH: 1. Osteoarthritis: Knees 2. Type II diabetes 3. GERD 4. Hyperlipidemia 5. CAD: LHC in 11/03 showed 50-60% D1, 50% LAD, 40% RCA stenoses.  Myoview (10/10): EF 63% with no perfusion defect.  6. History of sinus surgery.  7. Low back pain: History of back surgery, last in 1991.  8. OSA on CPAP.  9. 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   FH: Mother with MI at 6.   ROS: All systems reviewed and negative except as per HPI.   Current Outpatient Prescriptions  Medication Sig Dispense Refill  . aspirin 325 MG tablet Take 325 mg by mouth daily.        [name] atorvastatin (LIPITOR) 40 MG tablet Take 40 mg by mouth daily.      [name] esomeprazole (NEXIUM) 40 MG capsule Take 40 mg by mouth daily.        . fexofenadine (ALLEGRA) 180 MG tablet Take 180 mg by mouth as needed.       . metFORMIN (GLUCOPHAGE) 500 MG tablet Take 500 mg by mouth daily.        . metoprolol succinate (TOPROL-XL) 100 MG 24 hr tablet TAKE 1 TABLET DAILY  90  tablet  3  . mometasone (NASONEX) 50 MCG/ACT nasal spray Place 2 sprays into the nose as needed.       [name] NIACIN CR PO Take 2,000 mg by mouth daily.      [name] olmesartan (BENICAR) 20 MG tablet Take 20 mg by mouth daily.        . hydrochlorothiazide (HYDRODIURIL) 25 MG tablet Take 1 tablet (25 mg total) by mouth daily.  30 tablet  11  . PLAVIX 75 MG tablet TAKE 1 TABLET DAILY  90 tablet  3    BP 124/82  Ht 6\' 5"  (1.956 m)  Wt 308 lb (139.708 kg)  BMI 36.52 kg/m2 General: NAD, overweight Neck: Thick, no JVD, no thyromegaly or thyroid nodule.  Lungs: Clear to auscultation bilaterally with normal respiratory effort. CV: Nondisplaced PMI.  Heart regular S1/S2, no S3, soft S4, no murmur.  Trace ankle edema.  No carotid bruit.  Normal pedal pulses.  Abdomen: Soft, nontender, no hepatosplenomegaly, no distention.  Neurologic: Alert and oriented x 3.  Psych: Normal affect. Extremities: No clubbing or cyanosis.

## 2011-10-07 NOTE — Assessment & Plan Note (Signed)
Stable with no ischemic symptoms.  Would continue ASA and can continue Plavix (has been on long-term with no problems).  He will use ASA 325 as 81 mg will not help the Niaspan-induced flushing as much.  No bleeding sequelae.  Continue statin, beta blocker, ARB.

## 2011-10-07 NOTE — Assessment & Plan Note (Signed)
Check lipids, goal LDL < 70 with known CAD.

## 2011-10-07 NOTE — Assessment & Plan Note (Addendum)
BP running higher when he checks at home, 140s-150s often.  He has gained 15 lbs since last appointment, likely due to inactivity in the setting of significant low back pain.  I will start him on HCTZ 25 mg daily with BMET in 1 wk.  He also needs to work on weight loss (cut caloric intake).  He will call us with his BP readings in 2 wks.

## 2011-10-11 ENCOUNTER — Telehealth: Payer: Self-pay | Admitting: Cardiology

## 2011-10-11 ENCOUNTER — Other Ambulatory Visit (INDEPENDENT_AMBULATORY_CARE_PROVIDER_SITE_OTHER): Payer: 59

## 2011-10-11 DIAGNOSIS — I10 Essential (primary) hypertension: Secondary | ICD-10-CM

## 2011-10-11 DIAGNOSIS — I2510 Atherosclerotic heart disease of native coronary artery without angina pectoris: Secondary | ICD-10-CM

## 2011-10-11 LAB — HEPATIC FUNCTION PANEL
Alkaline Phosphatase: 81 U/L (ref 39–117)
Bilirubin, Direct: 0.1 mg/dL (ref 0.0–0.3)
Total Bilirubin: 0.9 mg/dL (ref 0.3–1.2)
Total Protein: 7.7 g/dL (ref 6.0–8.3)

## 2011-10-11 LAB — BASIC METABOLIC PANEL
BUN: 29 mg/dL — ABNORMAL HIGH (ref 6–23)
Calcium: 10 mg/dL (ref 8.4–10.5)
Chloride: 101 mEq/L (ref 96–112)
Creatinine, Ser: 1.4 mg/dL (ref 0.4–1.5)
GFR: 54.98 mL/min — ABNORMAL LOW (ref >60.00)

## 2011-10-11 LAB — LIPID PANEL
HDL: 42.3 mg/dL (ref >39.00)
LDL Cholesterol: 53 mg/dL (ref 0–99)
Total CHOL/HDL Ratio: 3

## 2011-10-11 NOTE — Telephone Encounter (Signed)
HCTZ start 10/04/11. Pt states some sluggishness since starting HCTZ.  Recent BP readings-- 10/05/11 141/87  10/06/11 132/88   10/07/11 113/78   10/08/11 101/69  10/09/11 100/69  10/10/11 121/77  116/87  10/11/11 100/66 also at PT today pt feeling lightheaded sitting 100/60 standing 80/50 I will forward to Dr Shirlee Latch for review and recommendations.

## 2011-10-11 NOTE — Telephone Encounter (Signed)
New  Problem:  Patient calling C/O problem with blood pressure, dizziness, lightheadedness, blood pressure issues Went to physical therapy did a sitting 100/66  standing 80/50  B/p

## 2011-10-11 NOTE — Telephone Encounter (Signed)
Spoke with pt and he is aware of Dr Alford Highland recommendations. He agreed with this plan.

## 2011-10-11 NOTE — Telephone Encounter (Signed)
Spoke with pt

## 2011-10-11 NOTE — Telephone Encounter (Signed)
BP on the low side.  Stop HCTZ and follow his readings at home.  If most are above 140 systolic, can start back on HCTZ 12.5 daily.

## 2012-01-07 ENCOUNTER — Ambulatory Visit (INDEPENDENT_AMBULATORY_CARE_PROVIDER_SITE_OTHER): Payer: 59 | Admitting: Surgery

## 2012-01-07 ENCOUNTER — Encounter (INDEPENDENT_AMBULATORY_CARE_PROVIDER_SITE_OTHER): Payer: Self-pay | Admitting: Surgery

## 2012-01-07 VITALS — BP 140/82 | HR 64 | Temp 98.9°F | Resp 18 | Ht 77.0 in | Wt 309.0 lb

## 2012-01-07 DIAGNOSIS — K612 Anorectal abscess: Secondary | ICD-10-CM

## 2012-01-07 DIAGNOSIS — K611 Rectal abscess: Secondary | ICD-10-CM

## 2012-01-07 NOTE — Progress Notes (Signed)
Patient ID: James Rodgers, male   DOB: Jan 22, 1951, 61 y.o.   MRN: [id number]  Chief Complaint  Patient presents with  . Follow-up    anal fissure    HPI James Rodgers is a 61 y.o. male.  Referred by Dr. Dossie Arbour for evaluation for anal fissure HPI This is a 60 year old male who was fishing trip last week. He began to notice some swelling and discomfort between his anus on his tailbone. This became fairly large. Last Saturday this spontaneously ruptured and drained a large amount of purulent and bloody fluid. The tenderness has improved somewhat. He continues to have some drainage. Dr. Jarold Motto started him on doxycycline yesterday. He comes to urgent office today.   Past Medical History  Diagnosis Date  . CAD (coronary artery disease)   . HLD (hyperlipidemia)   . Obesity   . HTN (hypertension)   . Anal fissure   . Arthritis   . Diabetes type 2, controlled     if controlled was not specified  . GERD (gastroesophageal reflux disease)   . Sleep apnea     Past Surgical History  Procedure Date  . Nasal sinus surgery   . Back surgery   . Anal fissure surgery     Family History  Problem Relation Age of Onset  . Heart disease Father   . Diabetes      GM    Social History History  Substance Use Topics  . Smoking status: Never Smoker   . Smokeless tobacco: Not on file  . Alcohol Use: No    No Known Allergies  Current Outpatient Prescriptions  Medication Sig Dispense Refill  . aspirin 325 MG tablet Take 325 mg by mouth daily.        [name] atorvastatin (LIPITOR) 40 MG tablet Take 40 mg by mouth daily.      [name] doxycycline (VIBRA-TABS) 100 MG tablet       . esomeprazole (NEXIUM) 40 MG capsule Take 40 mg by mouth daily.        . fexofenadine (ALLEGRA) 180 MG tablet Take 180 mg by mouth as needed.       . metFORMIN (GLUCOPHAGE) 500 MG tablet Take 500 mg by mouth daily.        . metoprolol succinate (TOPROL-XL) 100 MG 24 hr tablet TAKE 1 TABLET DAILY  90 tablet  3    . mometasone (NASONEX) 50 MCG/ACT nasal spray Place 2 sprays into the nose as needed.       [name] NIACIN CR PO Take 2,000 mg by mouth daily.      [name] olmesartan (BENICAR) 20 MG tablet Take 20 mg by mouth daily.        [name] PLAVIX 75 MG tablet TAKE 1 TABLET DAILY  90 tablet  3    Review of Systems Review of Systems  Constitutional: Negative for fever, chills and unexpected weight change.  HENT: Negative for hearing loss, congestion, sore throat, trouble swallowing and voice change.   Eyes: Negative for visual disturbance.  Respiratory: Negative for cough and wheezing.   Cardiovascular: Negative for chest pain, palpitations and leg swelling.  Gastrointestinal: Positive for rectal pain. Negative for nausea, vomiting, abdominal pain, diarrhea, constipation, blood in stool, abdominal distention and anal bleeding.  Genitourinary: Negative for hematuria and difficulty urinating.  Musculoskeletal: Negative for arthralgias.  Skin: Negative for rash and wound.  Neurological: Negative for seizures, syncope, weakness and headaches.  Hematological: Negative for adenopathy. Does not bruise/bleed easily.  Psychiatric/Behavioral: Negative  for confusion.    Blood pressure 140/82, pulse 64, temperature 98.9 F (37.2 C), temperature source Oral, resp. rate 18, height 6\' 5"  (1.956 m), weight 309 lb (140.161 kg).  Physical Exam Physical Exam WDWN in NAD Rectal - nl sphincter tone; no fissure or hemorrhoid disease noted Posterior midline - ruptured perirectal abscess with 5 mm opening; mild erythema and drainage Data Reviewed none  Assessment    Spontaneously ruptured perirectal abscess     Plan    No further surgical intervention noted.  Sitz baths/ neosporin.  Recheck in 3 weeks.         Yarixa Lightcap K. 01/07/2012, 2:53 PM

## 2012-01-07 NOTE — Patient Instructions (Signed)
Sitz baths - warm soapy water 2-3 times/ day Apply neosporin to the wound after each bath

## 2012-01-21 ENCOUNTER — Ambulatory Visit (INDEPENDENT_AMBULATORY_CARE_PROVIDER_SITE_OTHER): Payer: 59 | Admitting: Surgery

## 2012-01-21 ENCOUNTER — Encounter (INDEPENDENT_AMBULATORY_CARE_PROVIDER_SITE_OTHER): Payer: Self-pay | Admitting: Surgery

## 2012-01-21 VITALS — BP 128/78 | HR 72 | Temp 97.5°F | Ht 77.0 in | Wt 309.6 lb

## 2012-01-21 DIAGNOSIS — K612 Anorectal abscess: Secondary | ICD-10-CM

## 2012-01-21 DIAGNOSIS — K611 Rectal abscess: Secondary | ICD-10-CM

## 2012-01-21 NOTE — Progress Notes (Signed)
The patient returns for followup of a spontaneously ruptured posterior midline perirectal abscess. The patient reports no pain. He does not notice any drainage.  Bowel movements are normal. No rectal bleeding.  No further treatment needed.  The opening is about 5 mm across and very superficial.  No tunneling noted.    Follow-up PRN.  Wilmon Arms. Corliss Skains, MD, [facility]  01/21/2012 9:55 AM

## 2012-05-06 ENCOUNTER — Encounter: Payer: Self-pay | Admitting: Cardiology

## 2012-06-03 ENCOUNTER — Encounter: Payer: Self-pay | Admitting: Physician Assistant

## 2012-06-30 ENCOUNTER — Encounter (HOSPITAL_COMMUNITY): Payer: Self-pay | Admitting: Emergency Medicine

## 2012-06-30 ENCOUNTER — Emergency Department (HOSPITAL_COMMUNITY)
Admission: EM | Admit: 2012-06-30 | Discharge: 2012-06-30 | Disposition: A | Discharge status: Home / Self Care | Payer: 59 | Attending: Emergency Medicine | Admitting: Emergency Medicine

## 2012-06-30 DIAGNOSIS — Z79899 Other long term (current) drug therapy: Secondary | ICD-10-CM | POA: Insufficient documentation

## 2012-06-30 DIAGNOSIS — I10 Essential (primary) hypertension: Secondary | ICD-10-CM | POA: Insufficient documentation

## 2012-06-30 DIAGNOSIS — R5381 Other malaise: Secondary | ICD-10-CM | POA: Insufficient documentation

## 2012-06-30 DIAGNOSIS — K219 Gastro-esophageal reflux disease without esophagitis: Secondary | ICD-10-CM | POA: Insufficient documentation

## 2012-06-30 DIAGNOSIS — Z7982 Long term (current) use of aspirin: Secondary | ICD-10-CM | POA: Insufficient documentation

## 2012-06-30 DIAGNOSIS — Z8739 Personal history of other diseases of the musculoskeletal system and connective tissue: Secondary | ICD-10-CM | POA: Insufficient documentation

## 2012-06-30 DIAGNOSIS — E785 Hyperlipidemia, unspecified: Secondary | ICD-10-CM | POA: Insufficient documentation

## 2012-06-30 DIAGNOSIS — E119 Type 2 diabetes mellitus without complications: Secondary | ICD-10-CM | POA: Insufficient documentation

## 2012-06-30 DIAGNOSIS — Z8719 Personal history of other diseases of the digestive system: Secondary | ICD-10-CM | POA: Insufficient documentation

## 2012-06-30 DIAGNOSIS — R42 Dizziness and giddiness: Secondary | ICD-10-CM | POA: Insufficient documentation

## 2012-06-30 DIAGNOSIS — E669 Obesity, unspecified: Secondary | ICD-10-CM | POA: Insufficient documentation

## 2012-06-30 DIAGNOSIS — Z7902 Long term (current) use of antithrombotics/antiplatelets: Secondary | ICD-10-CM | POA: Insufficient documentation

## 2012-06-30 DIAGNOSIS — R110 Nausea: Secondary | ICD-10-CM | POA: Insufficient documentation

## 2012-06-30 DIAGNOSIS — R51 Headache: Secondary | ICD-10-CM | POA: Insufficient documentation

## 2012-06-30 DIAGNOSIS — I2510 Atherosclerotic heart disease of native coronary artery without angina pectoris: Secondary | ICD-10-CM | POA: Insufficient documentation

## 2012-06-30 DIAGNOSIS — R61 Generalized hyperhidrosis: Secondary | ICD-10-CM | POA: Insufficient documentation

## 2012-06-30 HISTORY — DX: Low back pain: M54.5

## 2012-06-30 HISTORY — DX: Low back pain, unspecified: M54.50

## 2012-06-30 HISTORY — DX: Unspecified osteoarthritis, unspecified site: M19.90

## 2012-06-30 LAB — CBC WITH DIFFERENTIAL/PLATELET
Basophils Absolute: 0 10*3/uL (ref 0.0–0.1)
Basophils Relative: 0 % (ref 0–1)
Eosinophils Absolute: 0 10*3/uL (ref 0.0–0.7)
Eosinophils Relative: 0 % (ref 0–5)
HCT: 37.9 % — ABNORMAL LOW (ref 39.0–52.0)
Hemoglobin: 13.5 g/dL (ref 13.0–17.0)
Lymphocytes Relative: 16 % (ref 12–46)
Lymphs Abs: 1.5 10*3/uL (ref 0.7–4.0)
MCH: 29 pg (ref 26.0–34.0)
MCHC: 35.6 g/dL (ref 30.0–36.0)
MCV: 81.5 fL (ref 78.0–100.0)
Monocytes Absolute: 0.4 10*3/uL (ref 0.1–1.0)
Monocytes Relative: 5 % (ref 3–12)
Neutro Abs: 7.4 10*3/uL (ref 1.7–7.7)
Neutrophils Relative %: 79 % — ABNORMAL HIGH (ref 43–77)
Platelets: 158 10*3/uL (ref 150–400)
RBC: 4.65 MIL/uL (ref 4.22–5.81)
RDW: 14 % (ref 11.5–15.5)
WBC: 9.4 10*3/uL (ref 4.0–10.5)

## 2012-06-30 LAB — URINALYSIS, ROUTINE W REFLEX MICROSCOPIC
Bilirubin Urine: NEGATIVE
Glucose, UA: 250 mg/dL — AB
Hgb urine dipstick: NEGATIVE
Ketones, ur: 15 mg/dL — AB
Leukocytes, UA: NEGATIVE
Nitrite: NEGATIVE
Protein, ur: NEGATIVE mg/dL
Specific Gravity, Urine: 1.023 (ref 1.005–1.030)
Urobilinogen, UA: 0.2 mg/dL (ref 0.0–1.0)
pH: 6 (ref 5.0–8.0)

## 2012-06-30 LAB — BASIC METABOLIC PANEL
BUN: 19 mg/dL (ref 6–23)
CO2: 27 mEq/L (ref 19–32)
Calcium: 9.5 mg/dL (ref 8.4–10.5)
Chloride: 105 mEq/L (ref 96–112)
Creatinine, Ser: 0.93 mg/dL (ref 0.50–1.35)
GFR calc Af Amer: >90 mL/min (ref >90)
GFR calc non Af Amer: 88 mL/min — ABNORMAL LOW (ref >90)
Glucose, Bld: 225 mg/dL — ABNORMAL HIGH (ref 70–99)
Potassium: 3.9 mEq/L (ref 3.5–5.1)
Sodium: 140 mEq/L (ref 135–145)

## 2012-06-30 LAB — POCT I-STAT TROPONIN I: Troponin i, poc: 0 ng/mL (ref 0.00–0.08)

## 2012-06-30 MED ORDER — ASPIRIN 81 MG PO CHEW
324.0000 mg | CHEWABLE_TABLET | Freq: Once | ORAL | Status: AC
  Administered 2012-06-30: 324 mg via ORAL
  Filled 2012-06-30: qty 4

## 2012-06-30 NOTE — Consult Note (Signed)
Patient ID: TARL CEPHAS MRN: [id number], DOB/AGE: 63-Sep-1952   Admit date: 06/30/2012  Primary Physician: Jarome Matin Primary Cardiologist: Golden Circle, MD  Pt. Profile:  62 y/o male with h/o nonobs CAD who presented to ED with presyncope, nausea, and diaphoresis, that we've been asked to eval.  Problem List  Past Medical History  Diagnosis Date  . CAD (coronary artery disease)     a. 2003 Cath: LAD 50, D1 50-60, RCA 40;  b. 12/2008 MV: no ischemia/infarct, EF 63%.  [name] HLD (hyperlipidemia)   . Obesity   . HTN (hypertension)   . Anal fissure   . Arthritis   . Diabetes type 2, controlled   . GERD (gastroesophageal reflux disease)   . Sleep apnea     Past Surgical History  Procedure Laterality Date  . Nasal sinus surgery    . Back surgery    . Anal fissure surgery      Allergies  No Known Allergies  HPI  62 y/o male with the above problem list.  He has a h/o chest discomfort dating back to 2003, at which time he underwent cath revealing nonobs dzs.  He had a neg MV in 2010.  He was in his US[place] last night, when while watching TV, he had sudden onset of nausea, diaphoresis, and presyncope.  The presyncope/lightheadedness passed within a few seconds but recurred several times over a period of about 1 hr.  The nausea/diaphoresis persisted for that entire time.  At no point did he note chest pain, sob, or palpitations.  He did not experience syncope.  His son, who is a Community education officer, took his VS and reports that his HR was in the 60's and blood pressure in the 150's.  After 1 hr of persistent Ss, he called EMS.  Following their arrival, he was given Zofran with almost immediate relief in Ss.  Since arrival to the ED, troponin has been neg (repeat pending), ECG is non-acute - though new 1st deg AVB is noted, and he has continued to feel weak and intermittently nauseated.  We've been asked to eval.  Home Medications  Prior to Admission medications   Medication Sig Start Date  End Date Taking? Authorizing Provider  aspirin 325 MG tablet Take 325 mg by mouth daily.     Yes Historical Provider, MD  atorvastatin (LIPITOR) 40 MG tablet Take 40 mg by mouth daily.   Yes Historical Provider, MD  esomeprazole (NEXIUM) 40 MG capsule Take 40 mg by mouth daily.     Yes Historical Provider, MD  fexofenadine (ALLEGRA) 180 MG tablet Take 180 mg by mouth daily as needed (for allergies).    Yes Historical Provider, MD  metFORMIN (GLUMETZA) 500 MG (MOD) 24 hr tablet Take 500 mg by mouth 2 (two) times daily with a meal.   Yes Historical Provider, MD  metoprolol succinate (TOPROL-XL) 100 MG 24 hr tablet TAKE 1 TABLET DAILY 05/15/11  Yes Laurey Morale, MD  mometasone (NASONEX) 50 MCG/ACT nasal spray Place 2 sprays into the nose daily as needed (for allergies).    Yes Historical Provider, MD  Multiple Vitamin (MULTIVITAMIN WITH MINERALS) TABS Take 1 tablet by mouth daily.   Yes Historical Provider, MD  NIASPAN 1000 MG CR tablet Take 2,000 mg by mouth at bedtime.  01/20/12  Yes Historical Provider, MD  olmesartan (BENICAR) 20 MG tablet Take 20 mg by mouth daily.     Yes Historical Provider, MD  PLAVIX 75 MG tablet TAKE 1  TABLET DAILY 05/15/11  Yes Laurey Morale, MD   Family History  Family History  Problem Relation Age of Onset  . Heart disease Father   . Diabetes      GM   Social History  History   Social History  . Marital Status: Married    Spouse Name: N/A    Number of Children: N/A  . Years of Education: N/A   Occupational History  . Not on file.   Social History Main Topics  . Smoking status: Never Smoker   . Smokeless tobacco: Not on file  . Alcohol Use: No  . Drug Use: No  . Sexually Active: Not on file   Other Topics Concern  . Not on file   Social History Narrative   Married; retired Company secretary; daily caffeine use.    Review of Systems General:  +++ chills, no fever, night sweats or weight changes.  Cardiovascular:  +++ presyncope. No chest pain, dyspnea  on exertion, edema, orthopnea, palpitations, paroxysmal nocturnal dyspnea. Dermatological: No rash, lesions/masses Respiratory: No cough, dyspnea Urologic: No hematuria, dysuria Abdominal:   +++ nausea w/o vomiting, diarrhea, bright red blood per rectum, melena, or hematemesis Neurologic:  No visual changes, +++ wkns, no changes in mental status. All other systems reviewed and are otherwise negative except as noted above.  Physical Exam  Blood pressure 123/60, pulse 68, temperature 97.6 F (36.4 C), temperature source Oral, resp. rate 16, SpO2 94.00%.  General: Pleasant, NAD Psych: flat affect. Neuro: Alert and oriented X 3. Moves all extremities spontaneously. HEENT: Normal  Neck: Supple without bruits or JVD. Lungs:  Resp regular and unlabored, CTA. Heart: RRR no s3, s4, or murmurs. Abdomen: Soft, non-tender, non-distended, BS + x 4.  Extremities: No clubbing, cyanosis or edema. DP/PT/Radials 2+ and equal bilaterally.  Labs  Trop i, poc 0.00  Lab Results  Component Value Date   WBC 9.4 06/30/2012   HGB 13.5 06/30/2012   HCT 37.9* 06/30/2012   MCV 81.5 06/30/2012   PLT 158 06/30/2012    Recent Labs Lab 06/30/12 0330  NA 140  K 3.9  CL 105  CO2 27  BUN 19  CREATININE 0.93  CALCIUM 9.5  GLUCOSE 225*   Lab Results  Component Value Date   CHOL 114 10/11/2011   HDL 42.30 10/11/2011   LDLCALC 53 10/11/2011   TRIG 95.0 10/11/2011   Radiology/Studies  No results found.  ECG  Sb, 59, 1st deg avb, no acute st/t changes.  ASSESSMENT AND PLAN  1.  Presyncope:  In setting of nausea and diaphoresis.  Ss resolved with zofran.  He reports having eaten out last night.  He has not had diarrhea and has not vomited.  He has no objective evidence of cardiac involvement at this point, though repeat poc troponin is currently pending.  We recommend internal medicine evaluation, as he still is feeling weak and mildly nauseated.  We will consider outpatient testing to possibly include  myoview +/- monitoring to r/o possible arrhythmic cause of event.  2.  HTN:  Stable.  3.  HL:  On statin.  4.  DM:  On metformin @ home.  Gluc 225 in ER.  Signed, Nicolasa Ducking, NP 06/30/2012, 6:49 AM  Patient seen and examined. I agree with the assessment and plan as detailed above. See also my additional thoughts below.   I examine the patient in the emergency room. I reviewed all data with Mr. Brion Aliment. I spoke with the patient's son in the  emergency room.( he is a Sports coach). At this point there is no proof of cardiac ischemia. We are still awaiting the second troponin. However the first troponin was 5 hours after he started feeling poorly. If his troponin becomes abnormal further cardiology workup will be appropriate. However at this point it seems that this was not a cardiac event. On the other hand, the patient's son describes the patient as sweating profusely and feeling cool and clammy. The patient did feel better with Zofran, but he is now beginning again to have some residual nausea. If this is some type of food poisoning, it may be that the patient will need to be watched further and possibly given IV hydration today. We are recommending that he be seen by internal medicine. It may be recommended that he be followed in the hospital with observation for 24 hours to be sure that his overall status stabilizes.  Willa Rough, MD, [facility] 06/30/2012 8:20 AM

## 2012-06-30 NOTE — ED Notes (Addendum)
Cardiologist in room with PT

## 2012-06-30 NOTE — ED Notes (Signed)
NAD noted at time of d/c home with family 

## 2012-06-30 NOTE — ED Provider Notes (Signed)
History     CSN: [id number]  Arrival date & time 06/30/12  0015   None     Chief Complaint  Patient presents with  . Dizziness    (Consider location/radiation/quality/duration/timing/severity/associated sxs/prior treatment) HPI History provided by pt.   Pt was sitting down, watching tv at 10:30pm last night, when he developed sudden onset nausea, diaphoresis and lightheadedness.  Lightheadedness worsened w/ movement.  Symptoms were constant for just over an hour and seemed to resolve w/ a dose of IV zofran en route to hospital.  Denies recent CP/SOB at rest or with exertion.  Has slight headache behind the eyes but otherwise no neurologic complaints.  No recent fever, cough, vomiting, diarrhea or urinary sx.   H/o HTN, diabetes and non-obstructive CAD for which he has been on plavix for several years.  Most recent cardiac cath in 2003 and stress myoview in 2010.   Past Medical History  Diagnosis Date  . CAD (coronary artery disease)   . HLD (hyperlipidemia)   . Obesity   . HTN (hypertension)   . Anal fissure   . Arthritis   . Diabetes type 2, controlled     if controlled was not specified  . GERD (gastroesophageal reflux disease)   . Sleep apnea     Past Surgical History  Procedure Laterality Date  . Nasal sinus surgery    . Back surgery    . Anal fissure surgery      Family History  Problem Relation Age of Onset  . Heart disease Father   . Diabetes      GM    History  Substance Use Topics  . Smoking status: Never Smoker   . Smokeless tobacco: Not on file  . Alcohol Use: No      Review of Systems  All other systems reviewed and are negative.    Allergies  Review of patient's allergies indicates no known allergies.  Home Medications   Current Outpatient Rx  Name  Route  Sig  Dispense  Refill  . aspirin 325 MG tablet   Oral   Take 325 mg by mouth daily.           [name] atorvastatin (LIPITOR) 40 MG tablet   Oral   Take 40 mg by mouth daily.         [name] esomeprazole (NEXIUM) 40 MG capsule   Oral   Take 40 mg by mouth daily.           . fexofenadine (ALLEGRA) 180 MG tablet   Oral   Take 180 mg by mouth daily as needed (for allergies).          . metFORMIN (GLUMETZA) 500 MG (MOD) 24 hr tablet   Oral   Take 500 mg by mouth 2 (two) times daily with a meal.         . metoprolol succinate (TOPROL-XL) 100 MG 24 hr tablet      TAKE 1 TABLET DAILY   90 tablet   3   . mometasone (NASONEX) 50 MCG/ACT nasal spray   Nasal   Place 2 sprays into the nose daily as needed (for allergies).          . Multiple Vitamin (MULTIVITAMIN WITH MINERALS) TABS   Oral   Take 1 tablet by mouth daily.         [name] NIASPAN 1000 MG CR tablet   Oral   Take 2,000 mg by mouth at bedtime.          [name]  olmesartan (BENICAR) 20 MG tablet   Oral   Take 20 mg by mouth daily.           [name] PLAVIX 75 MG tablet      TAKE 1 TABLET DAILY   90 tablet   3     BP 139/72  Temp(Src) 97.6 F (36.4 C) (Oral)  Resp 11  SpO2 98%  Physical Exam  Nursing note and vitals reviewed. Constitutional: He is oriented to person, place, and time. He appears well-developed and well-nourished. No distress.  HENT:  Head: Normocephalic and atraumatic.  Eyes:  Normal appearance  Neck: Normal range of motion.  Cardiovascular: Normal rate, regular rhythm and intact distal pulses.   Pulmonary/Chest: Effort normal and breath sounds normal. No respiratory distress.  No pleuritic pain reported  Abdominal: Soft. Bowel sounds are normal. He exhibits no distension. There is no tenderness. There is no guarding.  Musculoskeletal: Normal range of motion.  No peripheral edema or calf tenderness  Neurological: He is alert and oriented to person, place, and time.  CN 3-12 intact.  No sensory deficits.  5/5 and equal upper and lower extremity strength.  No past pointing.  Nml gait.  No dizziness reported w/ position changes and ambulation.  Skin: Skin is warm and dry. No rash  noted.  Psychiatric: He has a normal mood and affect. His behavior is normal.    ED Course  Procedures (including critical care time)   Date: 06/30/2012  Rate: 59  Rhythm: sinus bradycardia  QRS Axis: normal  Intervals: PR prolonged  ST/T Wave abnormalities: normal  Conduction Disutrbances:first-degree A-V block   Narrative Interpretation:   Old EKG Reviewed: unchanged   Labs Reviewed  CBC WITH DIFFERENTIAL - Abnormal; Notable for the following:    HCT 37.9 (*)    Neutrophils Relative 79 (*)    All other components within normal limits  BASIC METABOLIC PANEL - Abnormal; Notable for the following:    Glucose, Bld 225 (*)    GFR calc non Af Amer 88 (*)    All other components within normal limits  URINALYSIS, ROUTINE W REFLEX MICROSCOPIC - Abnormal; Notable for the following:    Glucose, UA 250 (*)    Ketones, ur 15 (*)    All other components within normal limits  POCT I-STAT TROPONIN I   No results found.   No diagnosis found.    MDM  62yo M w/ HTN, diabetes and non-obstructive CAD, most recent cath in 2003 and stress myoview in 2010, presents w/ acute onset lightheadedness, diaphoresis and nausea at rest at 10:30pm last night.  Sx lasted for just over an hour and seemed to resolve w/ IV zofran.  Currently c/o generalized weakness only.  No acute findings on exam.  EKG non-ischemic and labs sig only for neg troponin and hyperglycemia w/out acidosis.  Consulted cardiology and they are happy to see him in ED.  Pt will likely be discharged home.  AM mid-level to resume care. 6:06 AM      Otilio Miu, PA-C 06/30/12 [phone]

## 2012-06-30 NOTE — ED Notes (Addendum)
Pt st's he was sitting in recliner watching TV when he had sudden onset of nausea and diaphoresis and then became dizzy.  Pt st's if he moves his head he becomes dizzy. Neuro exam neg at this time.  Pt was given Zofran 4mg  IV PTA with relief of nausea.

## 2012-06-30 NOTE — ED Provider Notes (Signed)
Medical screening examination/treatment/procedure(s) were performed by non-physician practitioner and as supervising physician I was immediately available for consultation/collaboration.   Kearah Gayden, MD 06/30/12 0647 

## 2012-06-30 NOTE — ED Notes (Signed)
Pt st's now he is starting to develop slight headache, rate of 2-3 on pain scale 0/10.  Pt remains alert and oriented x's 3, skin warm and dry color appropriate.  Son at bedside.

## 2012-06-30 NOTE — ED Provider Notes (Signed)
Pt seen by cards and cleared for discharge--he is a symptomatic at this time  Toy Baker, MD 06/30/12 1054

## 2012-07-13 ENCOUNTER — Ambulatory Visit (INDEPENDENT_AMBULATORY_CARE_PROVIDER_SITE_OTHER): Payer: 59 | Admitting: Physician Assistant

## 2012-07-13 ENCOUNTER — Encounter: Payer: Self-pay | Admitting: Physician Assistant

## 2012-07-13 ENCOUNTER — Encounter: Payer: 59 | Admitting: Nurse Practitioner

## 2012-07-13 VITALS — BP 122/90 | HR 75 | Ht 77.0 in | Wt 307.8 lb

## 2012-07-13 DIAGNOSIS — I10 Essential (primary) hypertension: Secondary | ICD-10-CM

## 2012-07-13 DIAGNOSIS — I2510 Atherosclerotic heart disease of native coronary artery without angina pectoris: Secondary | ICD-10-CM

## 2012-07-13 DIAGNOSIS — G4733 Obstructive sleep apnea (adult) (pediatric): Secondary | ICD-10-CM

## 2012-07-13 DIAGNOSIS — E785 Hyperlipidemia, unspecified: Secondary | ICD-10-CM

## 2012-07-13 DIAGNOSIS — R42 Dizziness and giddiness: Secondary | ICD-10-CM

## 2012-07-13 NOTE — Progress Notes (Signed)
[address] [place], [place]  [address] Phone: [phone] Fax:  [phone]  Date:  07/13/2012   ID:  James Rodgers, DOB 1950/10/06, MRN [id number]  PCP:  Jarome Matin  Primary Cardiologist:  Dr. Marca Ancona     History of Present Illness: James Rodgers is a 62 y.o. male who returns for f/u after a recent trip to the ED.  He has a hx of moderate nonobstructive CAD.  He had a catheterization in 2003 with 50% LAD, 50-60% diagonal, and 40% RCA stenoses. Last stress myoview was in 10/10 and showed no evidence for ischemia or infarction.  He was seen in the emergency room by Dr. Myrtis Ser 06/30/12 with complaints of near syncope, nausea and diaphoresis. Symptoms occurred while at rest watching TV. He denied frank syncope. He had no chest pain. He had relief of symptoms with Zofran. He was brought to the hospital by EMS. Cardiac markers remained normal. No further cardiac workup was recommended. Internal medicine consult was recommended. Patient was discharged from the emergency room.  Since going to the ED, he denies any chest pain or significant dyspnea with exertion.  No orthopnea, PND, edema.  No syncope.  No further spells of near syncope.  He does not some lightheadedness after bending over and standing up.    Labs (3/14):  K 4.6, Cr 1.2, HDL 34, LDL 52 Labs (4/14):  K 3.9, Cr 0.93, Hgb 13.5  Wt Readings from Last 3 Encounters:  07/13/12 307 lb 12.8 oz (139.617 kg)  01/21/12 309 lb 9.6 oz (140.434 kg)  01/07/12 309 lb (140.161 kg)     Past Medical History  Diagnosis Date  . CAD (coronary artery disease)     a. 2003 Cath: LAD 50, D1 50-60, RCA 40;  b. 12/2008 MV: no ischemia/infarct, EF 63%.  [name] HLD (hyperlipidemia)   . Obesity   . HTN (hypertension)   . Anal fissure   . Arthritis   . Diabetes type 2, controlled   . GERD (gastroesophageal reflux disease)   . Sleep apnea     a. on cpap.  . Osteoarthritis     knees  . LBP (low back pain)     a.  h/o surgery in 1991.    Current Outpatient Prescriptions  Medication Sig Dispense Refill  . aspirin 325 MG tablet Take 325 mg by mouth daily.        [name] atorvastatin (LIPITOR) 40 MG tablet Take 40 mg by mouth daily.      [name] esomeprazole (NEXIUM) 40 MG capsule Take 40 mg by mouth daily.        . fexofenadine (ALLEGRA) 180 MG tablet Take 180 mg by mouth daily as needed (for allergies).       . metFORMIN (GLUMETZA) 500 MG (MOD) 24 hr tablet Take 500 mg by mouth 2 (two) times daily with a meal.      . metoprolol succinate (TOPROL-XL) 100 MG 24 hr tablet TAKE 1 TABLET DAILY  90 tablet  3  . mometasone (NASONEX) 50 MCG/ACT nasal spray Place 2 sprays into the nose daily as needed (for allergies).       . Multiple Vitamin (MULTIVITAMIN WITH MINERALS) TABS Take 1 tablet by mouth daily.      [name] NIASPAN 1000 MG CR tablet Take 2,000 mg by mouth at bedtime.       [name] olmesartan (BENICAR) 20 MG tablet Take 20 mg by mouth daily.        [name]  PLAVIX 75 MG tablet TAKE 1 TABLET DAILY  90 tablet  3   No current facility-administered medications for this visit.    Allergies:   No Known Allergies  Social History:  The patient  reports that he has never smoked. He does not have any smokeless tobacco history on file. He reports that he does not drink alcohol or use illicit drugs.   ROS:  Please see the history of present illness.   He has headaches due to decreased visual acuity.  Notes episodes of diarrhea.   All other systems reviewed and negative.   PHYSICAL EXAM: VS:  BP 122/90  Pulse 75  Ht 6\' 5"  (1.956 m)  Wt 307 lb 12.8 oz (139.617 kg)  BMI 36.49 kg/m2 Well nourished, well developed, in no acute distress HEENT: normal Neck: no JVD Cardiac:  normal S1, S2; RRR; no murmur Lungs:  clear to auscultation bilaterally, no wheezing, rhonchi or rales Abd: soft, nontender, no hepatomegaly Ext: no edema Skin: warm and dry Neuro:  CNs 2-12 intact, no focal abnormalities noted  EKG:  NSR, HR 75, NSSTTW changes       ASSESSMENT AND PLAN:  1. Near Syncope:  Sounds like a vagal episode.  No recurrence.  No arrhythmia documented.  Feels lightheaded at times with going from bending over to standing.  Has a hx of sleep apnea that is treated with CPAP.  Will check Echo to assess LV and RV fxn and chamber sizes.  Otherwise, he does not require further testing at this time.  He knows to call if he has recurrent symptoms.  Would consider event monitor at that time +/- stress testing.  2. CAD:  Stable.  No angina.  Continue ASA and statin.  He is also on Plavix.  No hx of PCI or ACS.  Continue for now as he has been on this for years and is tolerating ok.   3. Hypertension:  Controlled.  Continue current therapy.  4. Hyperlipidemia:  Continue statin.  Recent LDL optimal. 5. OSA:  Compliant with CPAP. 6. Disposition:  F/u with Dr. Marca Ancona in 10/2012 as planned.   Signed, Tereso Newcomer, PA-C  2:11 PM 07/13/2012

## 2012-07-13 NOTE — Patient Instructions (Addendum)
PLEASE SCHEDULE ECHO DX CAD, HTN, DIZZINESS  NO CHANGES WITH MEDICATIONS TODAY  PLEASE FOLLOW UP WITH DR. Shirlee Latch 11/02/12

## 2012-07-21 ENCOUNTER — Ambulatory Visit (HOSPITAL_COMMUNITY): Payer: 59 | Attending: Physician Assistant | Admitting: Radiology

## 2012-07-21 ENCOUNTER — Other Ambulatory Visit (HOSPITAL_COMMUNITY): Payer: Self-pay | Admitting: Radiology

## 2012-07-21 DIAGNOSIS — R42 Dizziness and giddiness: Secondary | ICD-10-CM | POA: Insufficient documentation

## 2012-07-21 DIAGNOSIS — I10 Essential (primary) hypertension: Secondary | ICD-10-CM | POA: Insufficient documentation

## 2012-07-21 DIAGNOSIS — E119 Type 2 diabetes mellitus without complications: Secondary | ICD-10-CM | POA: Insufficient documentation

## 2012-07-21 DIAGNOSIS — E669 Obesity, unspecified: Secondary | ICD-10-CM | POA: Insufficient documentation

## 2012-07-21 DIAGNOSIS — E785 Hyperlipidemia, unspecified: Secondary | ICD-10-CM | POA: Insufficient documentation

## 2012-07-21 DIAGNOSIS — I2510 Atherosclerotic heart disease of native coronary artery without angina pectoris: Secondary | ICD-10-CM

## 2012-07-21 DIAGNOSIS — I079 Rheumatic tricuspid valve disease, unspecified: Secondary | ICD-10-CM | POA: Insufficient documentation

## 2012-07-21 DIAGNOSIS — G4733 Obstructive sleep apnea (adult) (pediatric): Secondary | ICD-10-CM | POA: Insufficient documentation

## 2012-07-21 MED ORDER — PERFLUTREN PROTEIN A MICROSPH IV SUSP
0.5000 mL | Freq: Once | INTRAVENOUS | Status: AC
  Administered 2012-07-21: 3 mL via INTRAVENOUS

## 2012-07-21 NOTE — Progress Notes (Signed)
Echocardiogram performed with [employer].  

## 2012-09-08 ENCOUNTER — Encounter (INDEPENDENT_AMBULATORY_CARE_PROVIDER_SITE_OTHER): Payer: Self-pay | Admitting: General Surgery

## 2012-09-08 ENCOUNTER — Ambulatory Visit (INDEPENDENT_AMBULATORY_CARE_PROVIDER_SITE_OTHER): Payer: 59 | Admitting: General Surgery

## 2012-09-08 VITALS — BP 160/90 | HR 92 | Resp 14 | Ht 77.0 in | Wt 317.6 lb

## 2012-09-08 DIAGNOSIS — L02215 Cutaneous abscess of perineum: Secondary | ICD-10-CM

## 2012-09-08 DIAGNOSIS — K612 Anorectal abscess: Secondary | ICD-10-CM

## 2012-09-08 DIAGNOSIS — K611 Rectal abscess: Secondary | ICD-10-CM

## 2012-09-08 HISTORY — DX: Cutaneous abscess of perineum: L02.215

## 2012-09-08 MED ORDER — POLYETHYLENE GLYCOL 3350 17 GM/SCOOP PO POWD: 17.0000 g | Freq: Two times a day (BID) | ORAL | Status: DC

## 2012-09-08 MED ORDER — DOCUSATE SODIUM 100 MG PO CAPS: 100.0000 mg | ORAL_CAPSULE | Freq: Two times a day (BID) | ORAL | Status: DC

## 2012-09-08 NOTE — Progress Notes (Signed)
Subjective:     Patient ID: James Rodgers, male   DOB: 1950-03-26, 62 y.o.   MRN: [id number]  HPI  Pt is a 62 yo M who presents with recurrent perirectal abscess.  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  He does not know exactly what was done, but does not recall any dressing changes or any tubing/sutures.  He had perirectal abscess drained by Dr. Corliss Skains last fall.  He presents with constipation for a few days.  He has been recently traveling.  He had some soreness start around 5 days ago.  He developed swelling and increasing pain to now where it is excruciating.  He denies fevers/ chills.    Review of Systems  Gastrointestinal: Positive for rectal pain.  Musculoskeletal: Positive for arthralgias.  All other systems reviewed and are negative.       Objective:   Physical Exam  Constitutional: He is oriented to person, place, and time. He appears well-developed and well-nourished. No distress.  HENT:  Head: Normocephalic and atraumatic.  Eyes: Conjunctivae are normal. Pupils are equal, round, and reactive to light. No scleral icterus.  Neck: Neck supple.  Cardiovascular: Normal rate.   Pulmonary/Chest: Effort normal. No respiratory distress.  Genitourinary: Rectal exam shows external hemorrhoid.     Musculoskeletal: Normal range of motion.  Neurological: He is alert and oriented to person, place, and time. Coordination normal.  Skin: Skin is warm and dry. No rash noted. He is not diaphoretic. No erythema. No pallor.  Psychiatric: He has a normal mood and affect. His behavior is normal. Judgment and thought content normal.       Assessment/Plan:     Perirectal abscess Recurrent perirectal abscess incised and drained.  This one looked like it was in a thrombosed hemorrhoid.   Copious purulent drainage came out, but after that, several clots were evacuated.    Pressure held for 5 minutes.  No continued bleeding seen. Follow up in 63 weeks.   Advised to add stool  softeners, laxatives.  Advised to do sitz baths and ice packs.

## 2012-09-08 NOTE — Patient Instructions (Addendum)
Do sitz baths (warm water soaks) twice daily and ice packs as needed.    Add colace 100 mg po twice daily and miralax 17 gm twice daily.

## 2012-09-08 NOTE — Assessment & Plan Note (Signed)
Recurrent perirectal abscess incised and drained.  This one looked like it was in a thrombosed hemorrhoid.   Copious purulent drainage came out, but after that, several clots were evacuated.    Pressure held for 5 minutes.  No continued bleeding seen. Follow up in 2 weeks.   Advised to add stool softeners, laxatives.  Advised to do sitz baths and ice packs.

## 2012-09-22 ENCOUNTER — Ambulatory Visit (INDEPENDENT_AMBULATORY_CARE_PROVIDER_SITE_OTHER): Payer: 59 | Admitting: General Surgery

## 2012-09-22 ENCOUNTER — Encounter (INDEPENDENT_AMBULATORY_CARE_PROVIDER_SITE_OTHER): Payer: Self-pay | Admitting: General Surgery

## 2012-09-22 VITALS — BP 160/98 | HR 78 | Resp 14 | Ht 77.0 in | Wt 314.8 lb

## 2012-09-22 DIAGNOSIS — K611 Rectal abscess: Secondary | ICD-10-CM

## 2012-09-22 DIAGNOSIS — K649 Unspecified hemorrhoids: Secondary | ICD-10-CM

## 2012-09-22 DIAGNOSIS — K612 Anorectal abscess: Secondary | ICD-10-CM

## 2012-09-22 DIAGNOSIS — K648 Other hemorrhoids: Secondary | ICD-10-CM | POA: Insufficient documentation

## 2012-09-22 MED ORDER — HYDROCORTISONE 2.5 % RE CREA: TOPICAL_CREAM | Freq: Two times a day (BID) | RECTAL | Status: DC

## 2012-09-22 NOTE — Assessment & Plan Note (Signed)
Will prescribe hemorrhoid cream.  Would not start right away due to wound healing issues.

## 2012-09-22 NOTE — Assessment & Plan Note (Signed)
Resolved

## 2012-09-22 NOTE — Progress Notes (Signed)
HISTORY: Pt is a 62 yo male that presented with a perianal abscess in a hemorrhoid 2 weeks ago.  I incised and drained it.  He states that it is nearly completely better.  He is not having pain like before.  He denies drainage.      EXAM: General:  Alert and oriented.   Incision:  No evidence of residual infection.  Very small skin tags (2) with small external hemorrhoids behind.     PATHOLOGY: n/a   ASSESSMENT AND PLAN:   Hemorrhoids, complicated Will prescribe hemorrhoid cream.  Would not start right away due to wound healing issues.          Maudry Diego, MD Surgical Oncology, General & Endocrine Surgery [facility]  PATERSON, DANIEL No ref. provider found

## 2012-09-22 NOTE — Patient Instructions (Signed)
Add hemorrhoid cream when they start acting up.    Follow up as needed.  Continue to aggressively avoid constipation.

## 2012-11-02 ENCOUNTER — Encounter: Payer: Self-pay | Admitting: Cardiology

## 2012-11-02 ENCOUNTER — Ambulatory Visit (INDEPENDENT_AMBULATORY_CARE_PROVIDER_SITE_OTHER): Payer: 59 | Admitting: Cardiology

## 2012-11-02 VITALS — BP 125/89 | HR 79 | Ht 77.0 in | Wt 318.0 lb

## 2012-11-02 DIAGNOSIS — I2510 Atherosclerotic heart disease of native coronary artery without angina pectoris: Secondary | ICD-10-CM

## 2012-11-02 DIAGNOSIS — E785 Hyperlipidemia, unspecified: Secondary | ICD-10-CM

## 2012-11-02 DIAGNOSIS — R55 Syncope and collapse: Secondary | ICD-10-CM

## 2012-11-02 NOTE — Patient Instructions (Signed)
Your physician wants you to follow-up in: 1 year with Dr McLean. (August 2015).  You will receive a reminder letter in the mail two months in advance. If you don't receive a letter, please call our office to schedule the follow-up appointment.  

## 2012-11-03 DIAGNOSIS — R55 Syncope and collapse: Secondary | ICD-10-CM | POA: Insufficient documentation

## 2012-11-03 NOTE — Progress Notes (Signed)
Patient ID: James Rodgers, male   DOB: [birth year], 62 y.o.   MRN: [id number] PCP: Dr. Eloise Harman  62 yo with history of moderate nonobstructive CAD presents for cardiology followup.  He had a catheterization in 2003 with 50% LAD, 50-60% diagonal, and 40% RCA stenoses.  Last stress myoview was in 10/10 and showed no evidence for ischemia or infarction.  Since last appointment with me, he had an episode of presyncope in 4/14.  He was sitting on the sofa watching TV when he developed nausea and diaphoresis.  He felt lightheaded but did not pass out. No palpitations or chest pain.  He was seen in the ER with unremarkable evaluation.  He was then seen by Tereso Newcomer.  Echo was done, showing normal EF with moderate LVH.  Since that time, he has had no recurrence of syncope or presyncope.  No tachypalpitations.    He is stable symptomatically.  Every 4-5 months, he will feel chest tightness.  There is no trigger and it is not exertional.  No exertional dyspnea but exercise is limited by knee pain.  He golfs.  Weight is up about 11 lbs.    Labs (4/14): K 3.9, creatinine 0.93  ECG: NSR, 1st degree AV block  PMH: 1. Osteoarthritis: Knees 2. Type II diabetes 3. GERD 4. Hyperlipidemia 5. CAD: LHC in 11/03 showed 50-60% D1, 50% LAD, 40% RCA stenoses.  Myoview (10/10): EF 63% with no perfusion defect.  Echo (5/14) with EF 55-60%, moderate LVH, mild RV dilation with normal RV systolic function.  6. History of sinus surgery.  7. Low back pain: History of back surgery, last in 1991.  8. OSA on CPAP.  9. Obesity 10. Presyncopal episode 4/14: ? vasovagal  SH: Married with 2 children. Retired IT sales professional.  Nonsmoker.  Lives in [name].   FH: Mother with MI at 42.   ROS: All systems reviewed and negative except as per HPI.   Current Outpatient Prescriptions  Medication Sig Dispense Refill  . aspirin 325 MG tablet Take 325 mg by mouth daily.        [name] atorvastatin (LIPITOR) 40 MG tablet Take 40 mg by  mouth daily.      [name] docusate sodium (COLACE) 100 MG capsule Take 100 mg by mouth as needed.      [name] esomeprazole (NEXIUM) 40 MG capsule Take 40 mg by mouth daily.        . fexofenadine (ALLEGRA) 180 MG tablet Take 180 mg by mouth daily as needed (for allergies).       . metFORMIN (GLUMETZA) 500 MG (MOD) 24 hr tablet Take 500 mg by mouth 2 (two) times daily with a meal.      . metoprolol succinate (TOPROL-XL) 100 MG 24 hr tablet TAKE 1 TABLET DAILY  90 tablet  3  . mometasone (NASONEX) 50 MCG/ACT nasal spray Place 2 sprays into the nose daily as needed (for allergies).       [name] NIASPAN 1000 MG CR tablet Take 2,000 mg by mouth at bedtime.       [name] olmesartan (BENICAR) 20 MG tablet Take 20 mg by mouth daily.        [name] PLAVIX 75 MG tablet TAKE 1 TABLET DAILY  90 tablet  3  . polyethylene glycol powder (GLYCOLAX/MIRALAX) powder Take 17 g by mouth as needed.       No current facility-administered medications for this visit.    BP 125/89  Pulse 79  Ht 6\' 5"  (1.956 m)  Wt 144.244 kg (318 lb)  BMI 37.7 kg/m2 General: NAD, overweight Neck: Thick, no JVD, no thyromegaly or thyroid nodule.  Lungs: Clear to auscultation bilaterally with normal respiratory effort. CV: Nondisplaced PMI.  Heart regular S1/S2, no S3, soft S4, no murmur.  Trace ankle edema.  No carotid bruit.  Normal pedal pulses.  Abdomen: Soft, nontender, no hepatosplenomegaly, no distention.  Neurologic: Alert and oriented x 3.  Psych: Normal affect. Extremities: No clubbing or cyanosis.   Assessment/Plan: 1. Presyncope: No trigger and no recurrence.  Occurred while watching TV in 4/14.  Based on prodrome, it could have been vasovagal.  No tachypalpitations.  Echo ok.  - If he has a recurrence, should have 30 day event monitor.  2. CAD: No ischemic symptoms.  Continue ASA (uses 325 mg daily in order to tolerate Niaspan) and Plavix (has taken long-term and tolerated, will not change), Toprol XL, ARB, statin.  3. Hyperlipidemia: Continue  atorvastatin.  Will try to get copy of lipids from PCP.  4. Obesity: Needs more exercise.  Could try swimming, would be better tolerated from a joint pain standpoint.  He is a Nurse, adult.   5. HTN: BP is under good control.   Marca Ancona 11/03/2012

## 2012-12-07 ENCOUNTER — Ambulatory Visit (INDEPENDENT_AMBULATORY_CARE_PROVIDER_SITE_OTHER): Payer: 59 | Admitting: General Surgery

## 2012-12-07 ENCOUNTER — Encounter (INDEPENDENT_AMBULATORY_CARE_PROVIDER_SITE_OTHER): Payer: Self-pay | Admitting: General Surgery

## 2012-12-07 VITALS — BP 134/78 | HR 70 | Temp 98.1°F | Resp 16 | Ht 77.0 in | Wt 320.0 lb

## 2012-12-07 DIAGNOSIS — K610 Anal abscess: Secondary | ICD-10-CM

## 2012-12-07 DIAGNOSIS — K612 Anorectal abscess: Secondary | ICD-10-CM

## 2012-12-07 NOTE — Progress Notes (Signed)
Chief Complaint  Patient presents with  . Other    urge office/throm hems  . Rectal Problems    HISTORY: James Rodgers is a 62 y.o. male who presents to the office with anal pain.  Other symptoms include subjective fevers.  This had been occurring since last wed or Thu.  He has tried warm baths in the past with no success.  He is s/p I&D in July and last Nov at the same area.   His bowel habits are regular and his bowel movements are soft.  He denies straining.    Past Medical History  Diagnosis Date  . CAD (coronary artery disease)     a. 2003 Cath: LAD 50, D1 50-60, RCA 40;  b. 12/2008 MV: no ischemia/infarct, EF 63%.  [name] HLD (hyperlipidemia)   . Obesity   . HTN (hypertension)   . Anal fissure   . Arthritis   . Diabetes type 2, controlled   . GERD (gastroesophageal reflux disease)   . Sleep apnea     a. on cpap.  . Osteoarthritis     knees  . LBP (low back pain)     a. h/o surgery in 1991.      Past Surgical History  Procedure Laterality Date  . Nasal sinus surgery    . Back surgery    . Anal fissure surgery          Current Outpatient Prescriptions  Medication Sig Dispense Refill  . aspirin 325 MG tablet Take 325 mg by mouth daily.        [name] atorvastatin (LIPITOR) 40 MG tablet Take 40 mg by mouth daily.      [name] docusate sodium (COLACE) 100 MG capsule Take 100 mg by mouth as needed.      [name] esomeprazole (NEXIUM) 40 MG capsule Take 40 mg by mouth daily.        . fexofenadine (ALLEGRA) 180 MG tablet Take 180 mg by mouth daily as needed (for allergies).       . metFORMIN (GLUMETZA) 500 MG (MOD) 24 hr tablet Take 500 mg by mouth 2 (two) times daily with a meal.      . metoprolol succinate (TOPROL-XL) 100 MG 24 hr tablet TAKE 1 TABLET DAILY  90 tablet  3  . mometasone (NASONEX) 50 MCG/ACT nasal spray Place 2 sprays into the nose daily as needed (for allergies).       [name] NIASPAN 1000 MG CR tablet Take 2,000 mg by mouth at bedtime.       [name] olmesartan (BENICAR) 20 MG tablet  Take 20 mg by mouth daily.        [name] PLAVIX 75 MG tablet TAKE 1 TABLET DAILY  90 tablet  3  . polyethylene glycol powder (GLYCOLAX/MIRALAX) powder Take 17 g by mouth as needed.       No current facility-administered medications for this visit.      No Known Allergies    Family History  Problem Relation Age of Onset  . Heart disease Father   . Diabetes      GM    History   Social History  . Marital Status: Married    Spouse Name: N/A    Number of Children: N/A  . Years of Education: N/A   Social History Main Topics  . Smoking status: Never Smoker   . Smokeless tobacco: None  . Alcohol Use: No  . Drug Use: No  . Sexual Activity: None   Other Topics  Concern  . None   Social History Narrative   Married; retired Company secretary; daily caffeine use.  Lives with wife in [place].  Does not routinely exercise.      REVIEW OF SYSTEMS - PERTINENT POSITIVES ONLY: Review of Systems - General ROS: negative for - chills, fever or weight loss Hematological and Lymphatic ROS: negative for - bleeding problems, blood clots or bruising Respiratory ROS: no cough, shortness of breath, or wheezing Cardiovascular ROS: no chest pain or dyspnea on exertion Gastrointestinal ROS: no abdominal pain, change in bowel habits, or black or bloody stools Genito-Urinary ROS: no dysuria, trouble voiding, or hematuria  EXAM: Filed Vitals:   12/07/12 1457  BP: 134/78  Pulse: 70  Temp: 98.1 F (36.7 C)  Resp: 16    General appearance: alert and cooperative Resp: clear to auscultation bilaterally Cardio: regular rate and rhythm GI: normal findings: soft, non-tender   Procedure: I&D perianal abscess Surgeon: Maisie Fus Diagnosis: perianal abscess  Assistant: Glaspey After the risks and benefits were explained, verbal consent was obtained for above procedure  Anesthesia: none Findings: L posterior skin tag with fluctuance, concerning for abscess Procedure: Pt laid in decubitus position.  Area cleaned with  chloroprep.  Entire skin tag infused with lidocaine and excised with scissors.  Dark, cloudy bloody fluid removed and some clot removed.  Large defect noted.  Hemostasis achieved with direct pressure.  Area packed for added hemostasis.      ASSESSMENT AND PLAN: James Rodgers is a 62 y.o. M with a possible perianal abscess within an external hemorrhoid.  This was excised.  I will see him back in 3 weeks.  He may need an EUA to determine if a fistula is present.       Vanita Panda, MD Colon and Rectal Surgery / General Surgery [facility]      Visit Diagnoses: 1. Perianal abscess     Primary Care Physician: Jarome Matin

## 2012-12-07 NOTE — Patient Instructions (Signed)
Home Instructions Following Incision and Drainage of Perirectal Abscess  Wound care - A dressing has been applied to control any bleeding or drainage immediately after your procedure.  You may remove this dressing at your first bowel movement or tomorrow morning, whichever comes first.  There may be packing inside your wound as well that should be removed with the dressing.  You do not need to repack the area.  After the dressing is removed, clean the area gently with a mild soap and warm water and place a piece of 100% cotton over the area.  Change to cotton ever 1-3 hours while awake to keep the area clean and dry.   - Beginning tomorrow, sit in a tub of warm water for 15-20 minutes at least three times a day and after bowel movements.  This will help with healing, pain and discomfort. - A small amount of bleeding is to be expected.  If you notice an increase in the bleeding, place a large piece of cotton (about the size of a golf ball) next to the anal opening and sit on a hard surface for 15 minutes.  If the bleeding persists or if you are concerned, please call the office.  Do not sit on rubber rings.  Instead, sit on a soft pillow.    Diet -Eat a regular diet.  Avoid foods that may constipate you or give you diarrhea.  Drink 6-8 glasses of water a day and avoid seeds, nuts and popcorn until the area heals.  Medication -Take pain medication as directed.  Do not drive or operate machinery if you are taking a prescription pain medication.   - We recommend Extra Strength Tylenol for mild to moderate pain.  This can be taken as instructed on the bottle.   - If you are given a prescription for antibiotics, take as instructed by your doctor until the entire course is completed  Bowel Habits Avoid laxatives unless instructed by your doctor. Take a fiber supplement twice a day (Metamucil, FiberCon, Benefiber) Avoid excessive straining to have a bowel movement Do not go for more than 3 days without a  bowel movement.  Take a regular Fleet enema if you are constipated.  Call the office if unable to do this or no results.    Activity Resume activities as tolerated beginning tomorrow.  Avoid strenuous activities or sports for one week.    Call the office if you have any questions.  Call IMMEDIATELY if you should develop persistent heavy rectal bleeding, increase in pain, difficulty urinating or fever greater than 100 F.

## 2012-12-30 ENCOUNTER — Ambulatory Visit (INDEPENDENT_AMBULATORY_CARE_PROVIDER_SITE_OTHER): Payer: 59 | Admitting: General Surgery

## 2012-12-30 ENCOUNTER — Encounter (INDEPENDENT_AMBULATORY_CARE_PROVIDER_SITE_OTHER): Payer: Self-pay | Admitting: General Surgery

## 2012-12-30 VITALS — BP 130/78 | HR 72 | Temp 97.2°F | Resp 18 | Wt 312.0 lb

## 2012-12-30 DIAGNOSIS — K649 Unspecified hemorrhoids: Secondary | ICD-10-CM

## 2012-12-30 NOTE — Patient Instructions (Signed)
Call the office if you continue to have issues or develop chronic rectal drainage.

## 2012-12-30 NOTE — Progress Notes (Signed)
James Rodgers is a 62 y.o. male who is here for a follow up visit regarding his abscess.  He developed an abscess within a skin tag.  This was removed in the office.  He denies any continuing drainage.  His pain is minimal.  He denies bleeding  Objective: Filed Vitals:   12/30/12 1455  BP: 130/78  Pulse: 72  Temp: 97.2 F (36.2 C)  Resp: 18    General appearance: alert and cooperative GI: normal findings: soft, non-tender Perianal: healing incision site, no drainage or masses concerning for fistula  Assessment and Plan: Doing well.  RTO PRN.  Nothing on his exam is concerning for fistula right now.     Vanita Panda, MD [facility], [place] [phone]

## 2013-01-01 ENCOUNTER — Encounter (INDEPENDENT_AMBULATORY_CARE_PROVIDER_SITE_OTHER): Payer: 59 | Admitting: General Surgery

## 2015-03-16 ENCOUNTER — Encounter: Payer: Self-pay | Admitting: Internal Medicine

## 2015-07-24 ENCOUNTER — Encounter: Payer: Self-pay | Admitting: Cardiology

## 2015-07-24 ENCOUNTER — Ambulatory Visit (INDEPENDENT_AMBULATORY_CARE_PROVIDER_SITE_OTHER): Payer: Medicare Other | Admitting: Cardiology

## 2015-07-24 VITALS — BP 118/80 | HR 77 | Ht 77.0 in | Wt 305.8 lb

## 2015-07-24 DIAGNOSIS — I10 Essential (primary) hypertension: Secondary | ICD-10-CM | POA: Diagnosis not present

## 2015-07-24 DIAGNOSIS — R42 Dizziness and giddiness: Secondary | ICD-10-CM | POA: Diagnosis not present

## 2015-07-24 DIAGNOSIS — I2510 Atherosclerotic heart disease of native coronary artery without angina pectoris: Secondary | ICD-10-CM | POA: Diagnosis not present

## 2015-07-24 DIAGNOSIS — E785 Hyperlipidemia, unspecified: Secondary | ICD-10-CM | POA: Diagnosis not present

## 2015-07-24 MED ORDER — OLMESARTAN MEDOXOMIL 20 MG PO TABS: ORAL_TABLET | ORAL | Status: DC

## 2015-07-24 MED ORDER — ASPIRIN EC 81 MG PO TBEC: 81.0000 mg | DELAYED_RELEASE_TABLET | Freq: Every day | ORAL | Status: DC

## 2015-07-24 NOTE — Patient Instructions (Addendum)
Medication Instructions:  Decrease benicar to 10mg  daily. This will be 1/2 of a 20mg  tablet daily.  Decrease aspirin to 81mg  daily.   Stop Niaspan.  Labwork: Your physician recommends that you return for a FASTING lipid profile: in about 1 month.    Testing/Procedures: Your physician has requested that you have an echocardiogram. Echocardiography is a painless test that uses sound waves to create images of your heart. It provides your doctor with information about the size and shape of your heart and how well your heart's chambers and valves are working. This procedure takes approximately one hour. There are no restrictions for this procedure.    Follow-Up: Your physician recommends that you schedule a follow-up appointment in: about 1 month with Richardson Dopp, PA,c  Your physician wants you to follow-up in: 1 year with Dr Aundra Dubin. (May 2018). You will receive a reminder letter in the mail two months in advance. If you don't receive a letter, please call our office to schedule the follow-up appointment.         If you need a refill on your cardiac medications before your next appointment, please call your pharmacy.

## 2015-07-25 DIAGNOSIS — R42 Dizziness and giddiness: Secondary | ICD-10-CM | POA: Insufficient documentation

## 2015-07-25 NOTE — Progress Notes (Signed)
Patient ID: James Rodgers, male   DOB: 07/31/1950, 65 y.o.   MRN: [id number] PCP: Dr. Philip Aspen  65 yo with history of moderate nonobstructive CAD presents for cardiology followup.  He had a catheterization in 2003 with 50% LAD, 50-60% diagonal, and 40% RCA stenoses.  Last stress myoview was in 10/10 and showed no evidence for ischemia or infarction.  He has had no exertional chest pain or exertional dyspnea.  Main complaint is occasional lightheadedness.  He will note this when bending over then standing up and when he is on his feet for a long time on a hot day.  No tachypalpitations.      Labs (4/14): K 3.9, creatinine 0.93  ECG: NSR, normal  PMH: 1. Osteoarthritis: Knees 2. Type II diabetes 3. GERD 4. Hyperlipidemia 5. CAD: LHC in 11/03 showed 50-60% D1, 50% LAD, 40% RCA stenoses.  Myoview (10/10): EF 63% with no perfusion defect.  Echo (5/14) with EF 55-60%, moderate LVH, mild RV dilation with normal RV systolic function.  6. History of sinus surgery.  7. Low back pain: History of back surgery, last in 1991.  8. OSA on CPAP.  9. Obesity 10. Presyncopal episode 4/14: ? vasovagal  SH: Married with 2 children. Retired Airline pilot.  Nonsmoker.  Lives in [place].   FH: Mother with MI at 81.   ROS: All systems reviewed and negative except as per HPI.   Current Outpatient Prescriptions  Medication Sig Dispense Refill  . atorvastatin (LIPITOR) 40 MG tablet Take 40 mg by mouth daily.    . Dapagliflozin Propanediol (FARXIGA PO) Take 1 tablet by mouth daily.    [name] docusate sodium (COLACE) 100 MG capsule Take 100 mg by mouth as needed.    [name] esomeprazole (NEXIUM) 40 MG capsule Take 40 mg by mouth daily.      . fexofenadine (ALLEGRA) 180 MG tablet Take 180 mg by mouth daily as needed (for allergies).     . metFORMIN (GLUMETZA) 500 MG (MOD) 24 hr tablet Take 500 mg by mouth 2 (two) times daily with a meal.    . metoprolol succinate (TOPROL-XL) 100 MG 24 hr tablet TAKE 1 TABLET DAILY 90  tablet 3  . mometasone (NASONEX) 50 MCG/ACT nasal spray Place 2 sprays into the nose daily as needed (for allergies).     [name] PLAVIX 75 MG tablet TAKE 1 TABLET DAILY 90 tablet 3  . tamsulosin (FLOMAX) 0.4 MG CAPS capsule Take 0.4 mg by mouth daily.    [name] aspirin EC 81 MG tablet Take 1 tablet (81 mg total) by mouth daily.    [name] olmesartan (BENICAR) 20 MG tablet 1/2 tablet (10mg ) daily 45 tablet 1   No current facility-administered medications for this visit.    BP 118/80 mmHg  Pulse 77  Ht 6\' 5"  (1.956 m)  Wt 305 lb 12.8 oz (138.71 kg)  BMI 36.26 kg/m2 General: NAD, overweight Neck: Thick, no JVD, no thyromegaly or thyroid nodule.  Lungs: Clear to auscultation bilaterally with normal respiratory effort. CV: Nondisplaced PMI.  Heart regular S1/S2, no S3, soft S4, no murmur.  Trace ankle edema.  No carotid bruit.  Normal pedal pulses.  Abdomen: Soft, nontender, no hepatosplenomegaly, no distention.  Neurologic: Alert and oriented x 3.  Psych: Normal affect. Extremities: No clubbing or cyanosis.   Assessment/Plan: 1. Orthostatic symptoms: No syncope.  Always positional. - I think that he can cut back on his olmesartan to 10 mg daily.  If this does not help his symptoms,  he can cut Toprol XL to 50 mg daily.  - I will arrange for echo.  2. CAD: No ischemic symptoms.  Continue ASA and Plavix (has taken long-term and tolerated, will not change), Toprol XL, ARB, statin.  I will let him stop Niaspan given lack of evidence for benefit, and he can decrease ASA to 81 mg daily.  3. Hyperlipidemia: Continue atorvastatin.  Check lipids in 1 month off Niaspan.  4. HTN: BP has not been high.  I am decreasing olmesartan today with orthostatic-type symptoms.   Followup in 1 month with PA to see is dizziness is improved and can see me in 1 year.    Loralie Rodgers 07/25/2015

## 2015-07-27 ENCOUNTER — Ambulatory Visit (HOSPITAL_COMMUNITY): Payer: Medicare Other | Attending: Cardiology

## 2015-07-27 ENCOUNTER — Other Ambulatory Visit: Payer: Self-pay

## 2015-07-27 DIAGNOSIS — Z6836 Body mass index (BMI) 36.0-36.9, adult: Secondary | ICD-10-CM | POA: Insufficient documentation

## 2015-07-27 DIAGNOSIS — E785 Hyperlipidemia, unspecified: Secondary | ICD-10-CM | POA: Insufficient documentation

## 2015-07-27 DIAGNOSIS — I340 Nonrheumatic mitral (valve) insufficiency: Secondary | ICD-10-CM | POA: Insufficient documentation

## 2015-07-27 DIAGNOSIS — E119 Type 2 diabetes mellitus without complications: Secondary | ICD-10-CM | POA: Insufficient documentation

## 2015-07-27 DIAGNOSIS — E669 Obesity, unspecified: Secondary | ICD-10-CM | POA: Insufficient documentation

## 2015-07-27 DIAGNOSIS — G4733 Obstructive sleep apnea (adult) (pediatric): Secondary | ICD-10-CM | POA: Insufficient documentation

## 2015-07-27 DIAGNOSIS — I2510 Atherosclerotic heart disease of native coronary artery without angina pectoris: Secondary | ICD-10-CM | POA: Diagnosis present

## 2015-07-28 ENCOUNTER — Telehealth: Payer: Self-pay | Admitting: *Deleted

## 2015-07-28 DIAGNOSIS — I77810 Thoracic aortic ectasia: Secondary | ICD-10-CM

## 2015-07-28 NOTE — Telephone Encounter (Signed)
Notes Recorded by Larey Dresser, MD on 07/27/2015 at 10:27 PM EF 55-60%. 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 Should have MRA chest to fully assess the thoracic aorta for aneurysmal dilation.

## 2015-08-02 ENCOUNTER — Ambulatory Visit (HOSPITAL_COMMUNITY)
Admission: RE | Admit: 2015-08-02 | Discharge: 2015-08-02 | Disposition: A | Discharge status: Home / Self Care | Payer: Medicare Other | Source: Ambulatory Visit | Attending: Cardiology | Admitting: Cardiology

## 2015-08-02 DIAGNOSIS — Z0189 Encounter for other specified special examinations: Secondary | ICD-10-CM | POA: Insufficient documentation

## 2015-08-02 DIAGNOSIS — I77810 Thoracic aortic ectasia: Secondary | ICD-10-CM | POA: Insufficient documentation

## 2015-08-02 LAB — CREATININE, SERUM
CREATININE: 1.09 mg/dL (ref 0.61–1.24)
GFR calc Af Amer: >60 mL/min (ref >60)

## 2015-08-02 MED ORDER — GADOBENATE DIMEGLUMINE 529 MG/ML IV SOLN
20.0000 mL | Freq: Once | INTRAVENOUS | Status: AC
  Administered 2015-08-02: 20 mL via INTRAVENOUS

## 2015-08-18 ENCOUNTER — Encounter: Payer: Self-pay | Admitting: Physician Assistant

## 2015-08-21 ENCOUNTER — Other Ambulatory Visit (INDEPENDENT_AMBULATORY_CARE_PROVIDER_SITE_OTHER): Payer: Medicare Other | Admitting: *Deleted

## 2015-08-21 DIAGNOSIS — I2510 Atherosclerotic heart disease of native coronary artery without angina pectoris: Secondary | ICD-10-CM

## 2015-08-21 DIAGNOSIS — E785 Hyperlipidemia, unspecified: Secondary | ICD-10-CM

## 2015-08-21 LAB — LIPID PANEL
CHOL/HDL RATIO: 3.8 ratio (ref <=5.0)
CHOLESTEROL: 144 mg/dL (ref 125–200)
HDL: 38 mg/dL — AB (ref >=40)
LDL Cholesterol: 69 mg/dL (ref <130)
Triglycerides: 184 mg/dL — ABNORMAL HIGH (ref <150)
VLDL: 37 mg/dL — ABNORMAL HIGH (ref <30)

## 2015-08-23 NOTE — Progress Notes (Signed)
Cardiology Office Note:    Date:  08/24/2015   ID:  James Rodgers, DOB 11-23-1950, MRN [id number]  PCP:  Leanna Battles  Cardiologist:  Dr. [name]   Electrophysiologist:  n/a  Referring MD: Larey Dresser, MD   Chief Complaint  Patient presents with  . Dizziness    Follow up from 07/24/15    History of Present Illness:     James Rodgers is a 65 y.o. male with a hx of Nonobstructive CAD by cardiac catheterization in 2003, diabetes, HL. Myoview in 2010 was negative for ischemia. He was recent seen by Dr. Aundra Dubin 07/25/15 and complained of lightheadedness. This seem to be positional. His Olmesartan was reduced to 10 mg. Echocardiogram demonstrated normal LV function with mild diastolic dysfunction and dilated ascending aorta 45 mm. Follow-up chest MRA demonstrated ascending thoracic aortic aneurysm measuring 40 mm.  He returns for follow-up on his dizziness.  He is here today with his wife. He notes that his dizziness has improved significantly since decreasing his olmesartan. However, he still has occasional episodes of dizziness. This typically occurs while he is exercising getting overheated. He denies chest pain or significant dyspnea. Denies orthopnea or PND. He denies syncope. Denies any bleeding issues.  Past Medical History  Diagnosis Date  . CAD (coronary artery disease)     a. 2003 Cath: LAD 50, D1 50-60, RCA 40;  b. 12/2008 MV: no ischemia/infarct, EF 63%.  [name] HLD (hyperlipidemia)   . Obesity   . HTN (hypertension)   . Anal fissure   . Arthritis   . Diabetes type 2, controlled ([place])   . GERD (gastroesophageal reflux disease)   . Sleep apnea     a. on cpap.  . Osteoarthritis     knees  . LBP (low back pain)     a. h/o surgery in 1991.  [name] Thoracic aortic aneurysm ([place])     a. MRA 5/17 ascending thoracic aorta measuring 40 mm - recommend annual follow-up // b.  Echo 5/17: Moderate concentric LVH, EF 55-60%, normal wall motion, grade 1 diastolic  dysfunction, ascending aortic diameter 45 mm, mild MR, mild LAE  1. Osteoarthritis: Knees 2. Type II diabetes 3. GERD 4. Hyperlipidemia 5. CAD: LHC in 11/03 showed 50-60% D1, 50% LAD, 40% RCA stenoses. Myoview (10/10): EF 63% with no perfusion defect. Echo (5/14) with EF 55-60%, moderate LVH, mild RV dilation with normal RV systolic function.  6. History of sinus surgery.  7. Low back pain: History of back surgery, last in 1991.  8. OSA on CPAP.  9. Obesity 10. Presyncopal episode 4/14: ? vasovagal  Past Surgical History  Procedure Laterality Date  . Nasal sinus surgery    . Back surgery    . Anal fissure surgery      Current Medications: Outpatient Prescriptions Prior to Visit  Medication Sig Dispense Refill  . aspirin EC 81 MG tablet Take 1 tablet (81 mg total) by mouth daily.    [name] atorvastatin (LIPITOR) 40 MG tablet Take 40 mg by mouth daily.    [name] esomeprazole (NEXIUM) 40 MG capsule Take 40 mg by mouth daily.      . fexofenadine (ALLEGRA) 180 MG tablet Take 180 mg by mouth daily as needed (for allergies).     . metFORMIN (GLUMETZA) 500 MG (MOD) 24 hr tablet Take 500 mg by mouth 2 (two) times daily with a meal.    . metoprolol succinate (TOPROL-XL) 100 MG 24 hr tablet TAKE 1 TABLET DAILY  90 tablet 3  . mometasone (NASONEX) 50 MCG/ACT nasal spray Place 2 sprays into the nose daily as needed (for allergies).     . tamsulosin (FLOMAX) 0.4 MG CAPS capsule Take 0.4 mg by mouth daily.    . Dapagliflozin Propanediol (FARXIGA PO) Take 1 tablet by mouth daily. Reported on 08/24/2015    . docusate sodium (COLACE) 100 MG capsule Take 100 mg by mouth as needed. Reported on 08/24/2015    . olmesartan (BENICAR) 20 MG tablet 1/2 tablet (10mg ) daily (Patient not taking: Reported on 08/24/2015) 45 tablet 1  . PLAVIX 75 MG tablet TAKE 1 TABLET DAILY (Patient not taking: Reported on 08/24/2015) 90 tablet 3   No facility-administered medications prior to visit.      Allergies:   Review of  patient's allergies indicates no known allergies.   Social History   Social History  . Marital Status: Married    Spouse Name: N/A  . Number of Children: N/A  . Years of Education: N/A   Social History Main Topics  . Smoking status: Never Smoker   . Smokeless tobacco: None  . Alcohol Use: No  . Drug Use: No  . Sexual Activity: Not Asked   Other Topics Concern  . None   Social History Narrative   Married; retired Agricultural consultant; daily caffeine use.  Lives with wife in [place].  Does not routinely exercise.     Family History:  The patient's family history includes Heart disease in his father.   ROS:   Please see the history of present illness.    ROS All other systems reviewed and are negative.   Physical Exam:    VS:  BP 130/80 mmHg  Pulse 68  Ht 6\' 5"  (1.956 m)  Wt 306 lb 12.8 oz (139.164 kg)  BMI 36.37 kg/m2   Physical Exam  Constitutional: He is oriented to person, place, and time. He appears well-developed and well-nourished.  HENT:  Head: Normocephalic and atraumatic.  Neck: Normal range of motion. No JVD present.  Cardiovascular: Normal rate, regular rhythm and normal heart sounds.   No murmur heard. Pulmonary/Chest: Effort normal and breath sounds normal. He has no wheezes. He has no rales.  Abdominal: Soft. He exhibits no mass. There is no tenderness.  Musculoskeletal: Normal range of motion. He exhibits no edema.  Neurological: He is alert and oriented to person, place, and time.  Skin: Skin is warm and dry.  Psychiatric: He has a normal mood and affect.    Wt Readings from Last 3 Encounters:  08/24/15 306 lb 12.8 oz (139.164 kg)  07/24/15 305 lb 12.8 oz (138.71 kg)  12/30/12 312 lb (141.522 kg)      Studies/Labs Reviewed:     EKG:  EKG is not ordered today.  The ekg ordered today demonstrates n/a  Recent Labs: 08/02/2015: Creatinine, Ser 1.09   Recent Lipid Panel    Component Value Date/Time   CHOL 144 08/21/2015 0930   TRIG 184* 08/21/2015 0930    HDL 38* 08/21/2015 0930   CHOLHDL 3.8 08/21/2015 0930   VLDL 37* 08/21/2015 0930   LDLCALC 69 08/21/2015 0930    Additional studies/ records that were reviewed today include:   Chest MRA 07/13/15 IMPRESSION: Mild uncomplicated fusiform aneurysmal dilatation of the ascending thoracic aorta measuring 40 mm in maximal diameter. Recommend annual imaging followup by CTA or MRA. This recommendation follows 2010 ACCF/AHA/AATS/ACR/ASA/SCA/SCAI/SIR/STS/SVM Guidelines for the Diagnosis and Management of Patients with Thoracic Aortic Disease. Circulation. 2010; 121: [id number]  Echo 07/27/15 - Left ventricle: The cavity size was normal. There was moderate   concentric hypertrophy. Systolic function was normal. The   estimated ejection fraction was in the range of 55% to 60%. Wall   motion was normal; there were no regional wall motion   abnormalities. Doppler parameters are consistent with abnormal   left ventricular relaxation (grade 1 diastolic dysfunction). - Aortic valve: Trileaflet; mildly thickened, mildly calcified   leaflets. - Aorta: Ascending aortic diameter: 45 mm (S). - Mitral valve: There was mild regurgitation. - Left atrium: The atrium was mildly dilated. - Right ventricle: The cavity size was mildly dilated. Wall   thickness was normal.   Myoview 10/10 Normal, EF 63%  LHC 11/03 ANGIOGRAPHIC DATA: 1. The left main coronary artery is normal. 2. Left anterior descending: The left anterior descending has scattered  plaque. There is a large bifurcating diagonal vessel. There is a 50-60%  narrowing proximally and then in the mid portion, there is an area of  aneurysmal dilatation. The vessel is free of significant obstructive  disease otherwise. In the left anterior descending, there is an area of  40% narrowing and then another less significant area of aneurysmal  dilatation. The mid portion of the left anterior descending has somewhat  diffuse  30% narrowing. There is a good bit of atherosclerotic plaque in  the left anterior descending itself with 30-50% segmental narrowing. It  does not appear to be obstructive in nature. 3. Left circumflex: The left circumflex is a moderate-sized vessel. There  are irregularities but no significant focal disease. 4. Intermedius: There is a moderate-sized intermedius coronary. There are  scattered irregularities but no significant focal narrowing. 5. Right coronary artery: The right coronary artery is a very large  dominant vessel. There is slow antegrade flow and irregularities but  there is no significant focal obstructive disease present. The posterior  descending has a 40% narrowing in its mid portion. The large  posterolateral branch is free of significant obstructive disease. 6. Left ventricular angiogram: Left ventricular angiogram is performed in  the RAO position. Overall cardiac size and silhouette are normal. The  global ejection fraction is 55-60%. There is normal regional wall  motion. There is no mitral regurgitation. There is no intracavitary  filling defect. OVERALL IMPRESSION: 1. Normal left ventricular function. 2. Moderately severe single-vessel coronary disease (50-60% diagonal and  left anterior descending with 40% right coronary artery narrowings and  irregularities in the left circumflex).   ASSESSMENT:     1. Dizziness   2. CAD in native artery   3. Thoracic aortic aneurysm without rupture ([place])   4. Essential hypertension   5. Hyperlipidemia     PLAN:     In order of problems listed above:  1. Dizziness - His dizziness has improved with reducing his medication. His blood pressure no longer drops significantly. He does, however, continue to have episodes of dizziness. This occurs with exercise. He denies chest pain. However, he is a diabetic and did have moderate nonobstructive CAD by cardiac  catheterization many years ago. It has been 7 years since his last assessment for ischemia.   -  Continue current medications. If dizziness continues, decrease Toprol XL to 75 mg daily.  -  Arrange Lexiscan Myoview (he cannot walk on a treadmill due to knee DJD)  2. CAD - Moderate nonobstructive CAD by cardiac catheterization 2003. Low risk Myoview in 2010. As noted, given his exertional dizziness, I will arrange Lexiscan Myoview. Continue aspirin, Plavix,  statin, beta blocker.  3. Thoracic aortic aneurysm - This measures 40 mm by chest MRA. Follow-up MRI will be scheduled in 1 year.  4. HTN - He has kept track of his blood pressures at home. They have remained at target for the most part. No significantly reduced blood pressures noted. As noted above, consider changing Toprol to 75 mg daily if his dizziness continues.  5. HL - Continue Lipitor 40. LDL in 6/17 was 69.   Medication Adjustments/Labs and Tests Ordered: Current medicines are reviewed at length with the patient today.  Concerns regarding medicines are outlined above.  Medication changes, Labs and Tests ordered today are outlined in the Patient Instructions noted below. Patient Instructions  Medication Instructions:  No changes. Over the next 3-4 weeks, if your dizziness continues, call me or Dr. [name] and we can cut back on your Toprol. Labwork: None today. Testing/Procedures: 1. Schedule a Lexiscan Myoview. Follow-Up: Dr. [name] or Richardson Dopp, PA-C in 3 months.  Any Other Special Instructions Will Be Listed Below (If Applicable). If you need a refill on your cardiac medications before your next appointment, please call your pharmacy.    Signed, Richardson Dopp, PA-C  08/24/2015 9:15 AM    [facility] [place], [place], [place]  [address] Phone: [phone]; Fax: [phone]

## 2015-08-24 ENCOUNTER — Ambulatory Visit (INDEPENDENT_AMBULATORY_CARE_PROVIDER_SITE_OTHER): Payer: Medicare Other | Admitting: Physician Assistant

## 2015-08-24 ENCOUNTER — Encounter: Payer: Self-pay | Admitting: Physician Assistant

## 2015-08-24 VITALS — BP 130/80 | HR 68 | Ht 77.0 in | Wt 306.8 lb

## 2015-08-24 DIAGNOSIS — R42 Dizziness and giddiness: Secondary | ICD-10-CM | POA: Diagnosis not present

## 2015-08-24 DIAGNOSIS — E785 Hyperlipidemia, unspecified: Secondary | ICD-10-CM

## 2015-08-24 DIAGNOSIS — I712 Thoracic aortic aneurysm, without rupture, unspecified: Secondary | ICD-10-CM | POA: Insufficient documentation

## 2015-08-24 DIAGNOSIS — I10 Essential (primary) hypertension: Secondary | ICD-10-CM

## 2015-08-24 DIAGNOSIS — I2510 Atherosclerotic heart disease of native coronary artery without angina pectoris: Secondary | ICD-10-CM

## 2015-08-24 NOTE — Patient Instructions (Addendum)
Medication Instructions:  No changes. Over the next 3-4 weeks, if your dizziness continues, call me or Dr. [name] and we can cut back on your Toprol. Labwork: None today. Testing/Procedures: 1. Schedule a Lexiscan Myoview. Follow-Up: Dr. [name] or Richardson Dopp, PA-C in 3 months.  Any Other Special Instructions Will Be Listed Below (If Applicable). If you need a refill on your cardiac medications before your next appointment, please call your pharmacy.

## 2015-10-11 ENCOUNTER — Telehealth (HOSPITAL_COMMUNITY): Payer: Self-pay | Admitting: *Deleted

## 2015-10-11 NOTE — Telephone Encounter (Signed)
Left message on voicemail per DPR in reference to upcoming appointment scheduled on 10/16/15 with detailed instructions given per Myocardial Perfusion Study Information Sheet for the test. LM to arrive 15 minutes early, and that it is imperative to arrive on time for appointment to keep from having the test rescheduled. If you need to cancel or reschedule your appointment, please call the office within 24 hours of your appointment. Failure to do so may result in a cancellation of your appointment, and a $50 no show fee. Phone number given for call back for any questions. Hubbard Robinson, RN

## 2015-10-16 ENCOUNTER — Encounter: Payer: Self-pay | Admitting: Physician Assistant

## 2015-10-16 ENCOUNTER — Ambulatory Visit (HOSPITAL_COMMUNITY): Payer: Medicare Other | Attending: Cardiovascular Disease

## 2015-10-16 DIAGNOSIS — R079 Chest pain, unspecified: Secondary | ICD-10-CM | POA: Diagnosis not present

## 2015-10-16 DIAGNOSIS — I119 Hypertensive heart disease without heart failure: Secondary | ICD-10-CM | POA: Diagnosis not present

## 2015-10-16 DIAGNOSIS — I2510 Atherosclerotic heart disease of native coronary artery without angina pectoris: Secondary | ICD-10-CM | POA: Diagnosis not present

## 2015-10-16 DIAGNOSIS — R9439 Abnormal result of other cardiovascular function study: Secondary | ICD-10-CM | POA: Diagnosis not present

## 2015-10-16 DIAGNOSIS — R42 Dizziness and giddiness: Secondary | ICD-10-CM | POA: Insufficient documentation

## 2015-10-16 DIAGNOSIS — E119 Type 2 diabetes mellitus without complications: Secondary | ICD-10-CM | POA: Insufficient documentation

## 2015-10-16 LAB — MYOCARDIAL PERFUSION IMAGING
CHL CUP NUCLEAR SSS: 8
CHL CUP RESTING HR STRESS: 58 {beats}/min
LV dias vol: 145 mL (ref 62–150)
LV sys vol: 69 mL
Peak HR: 76 {beats}/min
RATE: 0.29
SDS: 6
SRS: 2
TID: 1.1

## 2015-10-16 MED ORDER — TECHNETIUM TC 99M TETROFOSMIN IV KIT
30.0000 | PACK | Freq: Once | INTRAVENOUS | Status: AC | PRN
  Administered 2015-10-16: 30 via INTRAVENOUS
  Filled 2015-10-16: qty 30

## 2015-10-16 MED ORDER — TECHNETIUM TC 99M TETROFOSMIN IV KIT
10.0000 | PACK | Freq: Once | INTRAVENOUS | Status: AC | PRN
  Administered 2015-10-16: 10 via INTRAVENOUS
  Filled 2015-10-16: qty 10

## 2015-10-16 MED ORDER — REGADENOSON 0.4 MG/5ML IV SOLN
0.4000 mg | Freq: Once | INTRAVENOUS | Status: AC
  Administered 2015-10-16: 0.4 mg via INTRAVENOUS

## 2015-10-17 ENCOUNTER — Ambulatory Visit (HOSPITAL_COMMUNITY): Payer: Medicare Other

## 2015-10-20 ENCOUNTER — Telehealth: Payer: Self-pay | Admitting: *Deleted

## 2015-10-20 NOTE — Telephone Encounter (Signed)
Pt has been notified of myoview results and findings by phone with verbal understanding. Pt agreeable to keep f/u appt 9/18.

## 2015-11-09 ENCOUNTER — Encounter: Payer: Self-pay | Admitting: Physician Assistant

## 2015-11-26 NOTE — Progress Notes (Signed)
Cardiology Office Note:    Date:  11/27/2015   ID:  AWS MALLARE, DOB May 24, 1950, MRN [id number]  PCP:  Leanna Battles (Inactive)  Cardiologist:  Dr. [name]   Electrophysiologist:  n/a  Referring MD: No ref. provider found   Chief Complaint  Patient presents with  . Follow-up    dizziness    History of Present Illness:    James Rodgers is a 65 y.o. male with a hx of nonobstructive CAD by cardiac catheterization in 2003, diabetes, HL. Myoview in 2010 was negative for ischemia. In 5/17 he complained of lightheadedness and his olmesartan was reduced to 10 mg. Echocardiogram demonstrated normal LV function with mild diastolic dysfunction and dilated ascending aorta 45 mm. Follow-up chest MRA demonstrated ascending thoracic aortic aneurysm measuring 40 mm. I saw him in 6/17.  He sill noted dizziness with exercise.  However, overall, his dizziness was better with improved BP. I set him up for a nuclear stress test that demonstrated mild inferoseptal and apical ischemia, EF 53%.  This was a low risk study and I had Dr. [name] review this.  He felt the patient could be treated medically unless he had worsening symptoms.  The patient returns for FU.  Since last seen, he went to [place] for a golf trip. He had no issues while he was there. He really denies any significant dizziness.  He denies exertional chest pain or dyspnea.  He has an occasional brief chest pain that will occur at rest.  He denies orthopnea, PND, edema.    Prior CV studies that were reviewed today include:    Nuc Stress Study 10/16/15 Low risk stress nuclear study with mild ischemia in the basal inferoseptal wall and mild ischemia in the apical wall; EF 53 with mild global hypokinesis and mild LVE.  Chest MRA 07/13/15 IMPRESSION: Mild uncomplicated fusiform aneurysmal dilatation of the ascending thoracic aorta measuring 40 mm in maximal diameter. Recommend annual imaging followup by CTA or MRA.    Echo 07/27/15 Mod LVH, EF 55-60, no RWMA, Gr 1 DD, Asc Ao 45 mm, mild MR, mild LAE, mild RVE  Myoview 10/10 Normal, EF 63%  LHC 11/03 LM normal LAD 40 then aneurysmal dilatation, mid 30-50; Dx with proximal 50-60, mid aneurysmal dilatation LCx irregs RI irregs RCA with PDA mid 40 EF 55-60   Past Medical History:  Diagnosis Date  . Anal fissure   . Arthritis   . CAD (coronary artery disease)    a. 2003 Cath: LAD 50, D1 50-60, RCA 40;  b. 12/2008 MV: no ischemia/infarct, EF 63%. // c. MV 8/17: mild inf-sept and apical ischemia, EF 53%, Low Risk  . Diabetes type 2, controlled ([place])   . GERD (gastroesophageal reflux disease)   . History of nuclear stress test    a. Myoview 8/17: EF 53%, mild inferoseptal and mild apical ischemia, low risk  . HLD (hyperlipidemia)   . HTN (hypertension)   . LBP (low back pain)    a. h/o surgery in 1991.  . Obesity   . Osteoarthritis    knees  . Sleep apnea    a. on cpap.  . Thoracic aortic aneurysm ([place])    a. MRA 5/17 ascending thoracic aorta measuring 40 mm - recommend annual follow-up // b.  Echo 5/17: Moderate concentric LVH, EF 55-60%, normal wall motion, grade 1 diastolic dysfunction, ascending aortic diameter 45 mm, mild MR, mild LAE    Past Surgical History:  Procedure Laterality Date  .  anal fissure surgery    . back surgery    . NASAL SINUS SURGERY      Current Medications: Current Meds  Medication Sig  . aspirin EC 81 MG tablet Take 1 tablet (81 mg total) by mouth daily.  [name] atorvastatin (LIPITOR) 40 MG tablet Take 40 mg by mouth daily.  . clopidogrel (PLAVIX) 75 MG tablet Take 75 mg by mouth daily.  [name] esomeprazole (NEXIUM) 40 MG capsule Take 40 mg by mouth daily.    [name] FARXIGA 10 MG TABS tablet Take 10 mg by mouth daily.   . fexofenadine (ALLEGRA) 180 MG tablet Take 180 mg by mouth daily as needed (for allergies).   . metFORMIN (GLUMETZA) 500 MG (MOD) 24 hr tablet Take 500 mg by mouth 2 (two) times daily with a meal.  .  metoprolol succinate (TOPROL-XL) 100 MG 24 hr tablet TAKE 1 TABLET DAILY  . mometasone (NASONEX) 50 MCG/ACT nasal spray Place 2 sprays into the nose daily as needed (for allergies).   . olmesartan (BENICAR) 20 MG tablet Take 1/2 tablet (10 mg total) by mouth daily  . tamsulosin (FLOMAX) 0.4 MG CAPS capsule Take 0.4 mg by mouth daily.       Allergies:   Review of patient's allergies indicates no known allergies.   Social History   Social History  . Marital status: Married    Spouse name: N/A  . Number of children: N/A  . Years of education: N/A   Social History Main Topics  . Smoking status: Never Smoker  . Smokeless tobacco: Never Used  . Alcohol use No  . Drug use: No  . Sexual activity: Not Asked   Other Topics Concern  . None   Social History Narrative   Married; retired Agricultural consultant; daily caffeine use.  Lives with wife in [place].  Does not routinely exercise.     Family History:  The patient's family history includes Heart disease in his father.   ROS:   Please see the history of present illness.    ROS All other systems reviewed and are negative.   EKGs/Labs/Other Test Reviewed:    EKG:  EKG is  ordered today.  The ekg ordered today demonstrates NSR, HR 65, NSSTTW changes, PR 210 ms, no significant changes  Recent Labs: 08/02/2015: Creatinine, Ser 1.09   Recent Lipid Panel    Component Value Date/Time   CHOL 144 08/21/2015 0930   TRIG 184 (H) 08/21/2015 0930   HDL 38 (L) 08/21/2015 0930   CHOLHDL 3.8 08/21/2015 0930   VLDL 37 (H) 08/21/2015 0930   LDLCALC 69 08/21/2015 0930     Physical Exam:    VS:  BP 134/82   Pulse 65   Ht 6\' 5"  (1.956 m)   Wt 298 lb (135.2 kg)   BMI 35.34 kg/m     Wt Readings from Last 3 Encounters:  11/27/15 298 lb (135.2 kg)  10/16/15 (!) 306 lb (138.8 kg)  08/24/15 (!) 306 lb 12.8 oz (139.2 kg)     Physical Exam  Constitutional: He is oriented to person, place, and time. He appears well-developed and well-nourished. No  distress.  HENT:  Head: Normocephalic and atraumatic.  Eyes: No scleral icterus.  Neck: No JVD present.  Cardiovascular: Normal rate, regular rhythm and normal heart sounds.   No murmur heard. Pulmonary/Chest: Effort normal. He has no wheezes. He has no rales.  Abdominal: Soft. There is no tenderness.  Musculoskeletal: He exhibits no edema.  Neurological: He is alert  and oriented to person, place, and time.  Skin: Skin is warm and dry.  Psychiatric: He has a normal mood and affect.    ASSESSMENT:    1. Dizziness   2. Coronary artery disease involving native coronary artery of native heart without angina pectoris   3. Thoracic aortic aneurysm without rupture ([employer])   4. Essential hypertension    PLAN:    In order of problems listed above:  1. Dizziness -  No further episodes of dizziness.    2. CAD - Moderate nonobstructive CAD by cardiac catheterization 2003. Low risk Myoview in 2010.  Recent Myoview in 8/17 with mild inf-septal and apical ischemia, but low risk.  I reviewed his test results with him today in detail.  He is not having any unstable symptoms.  No further testing is needed at this time.  if he has any symptoms suggestive of angina, will need to consider cath.  Continue aspirin, Plavix, statin, beta blocker.  3. Thoracic aortic aneurysm - This measures 40 mm by chest MRA. He needs follow-up MRI in 1 year.  4. HTN -  BP controlled.    Medication Adjustments/Labs and Tests Ordered: Current medicines are reviewed at length with the patient today.  Concerns regarding medicines are outlined above.  Medication changes, Labs and Tests ordered today are outlined in the Patient Instructions noted below. Patient Instructions  Medication Instructions:  Your physician recommends that you continue on your current medications as directed. Please refer to the Current Medication list given to you today.  Labwork: NONE  Testing/Procedures: NONE  Follow-Up: Your  physician wants you to follow-up in: [place], [facility]  You will receive a reminder letter in the mail two months in advance. If you don't receive a letter, please call our office to schedule the follow-up appointment.  Any Other Special Instructions Will Be Listed Below (If Applicable).  If you need a refill on your cardiac medications before your next appointment, please call your pharmacy.  Signed, Richardson Dopp, PA-C  11/27/2015 2:24 PM    [facility] [place], [place], [place]  [address] Phone: [phone]; Fax: [phone]

## 2015-11-27 ENCOUNTER — Encounter: Payer: Self-pay | Admitting: Physician Assistant

## 2015-11-27 ENCOUNTER — Ambulatory Visit (INDEPENDENT_AMBULATORY_CARE_PROVIDER_SITE_OTHER): Payer: Medicare Other | Admitting: Physician Assistant

## 2015-11-27 VITALS — BP 134/82 | HR 65 | Ht 77.0 in | Wt 298.0 lb

## 2015-11-27 DIAGNOSIS — R42 Dizziness and giddiness: Secondary | ICD-10-CM

## 2015-11-27 DIAGNOSIS — I10 Essential (primary) hypertension: Secondary | ICD-10-CM | POA: Diagnosis not present

## 2015-11-27 DIAGNOSIS — I2510 Atherosclerotic heart disease of native coronary artery without angina pectoris: Secondary | ICD-10-CM | POA: Diagnosis not present

## 2015-11-27 DIAGNOSIS — I712 Thoracic aortic aneurysm, without rupture, unspecified: Secondary | ICD-10-CM

## 2015-11-27 NOTE — Progress Notes (Signed)
He will need to be with Dr End.

## 2015-11-27 NOTE — Patient Instructions (Addendum)
Medication Instructions:  Your physician recommends that you continue on your current medications as directed. Please refer to the Current Medication list given to you today.  Labwork: NONE  Testing/Procedures: NONE  Follow-Up: Your physician wants you to follow-up in: 6 MONTHS WITH SCOTT WEAVER, PAC  You will receive a reminder letter in the mail two months in advance. If you don't receive a letter, please call our office to schedule the follow-up appointment.  Any Other Special Instructions Will Be Listed Below (If Applicable).  If you need a refill on your cardiac medications before your next appointment, please call your pharmacy. 

## 2016-05-23 ENCOUNTER — Encounter: Payer: Self-pay | Admitting: Physician Assistant

## 2016-06-03 ENCOUNTER — Encounter (INDEPENDENT_AMBULATORY_CARE_PROVIDER_SITE_OTHER): Payer: Self-pay

## 2016-06-03 ENCOUNTER — Encounter: Payer: Self-pay | Admitting: Physician Assistant

## 2016-06-03 ENCOUNTER — Ambulatory Visit (INDEPENDENT_AMBULATORY_CARE_PROVIDER_SITE_OTHER): Payer: Medicare Other | Admitting: Physician Assistant

## 2016-06-03 VITALS — BP 130/80 | HR 57 | Ht 77.0 in | Wt 295.8 lb

## 2016-06-03 DIAGNOSIS — I712 Thoracic aortic aneurysm, without rupture, unspecified: Secondary | ICD-10-CM

## 2016-06-03 DIAGNOSIS — I10 Essential (primary) hypertension: Secondary | ICD-10-CM

## 2016-06-03 DIAGNOSIS — I2510 Atherosclerotic heart disease of native coronary artery without angina pectoris: Secondary | ICD-10-CM | POA: Diagnosis not present

## 2016-06-03 NOTE — Patient Instructions (Addendum)
Medication Instructions:  Your physician recommends that you continue on your current medications as directed. Please refer to the Current Medication list given to you today.  Labwork: NONE ORDERED  Testing/Procedures: YOU WILL NEED TO BE SET UP FOR A MRA CHEST WITH AND W/O CONTRAST TO BE DONE AT [place] 08/2016  Follow-Up: Your physician wants you to follow-up in: 1 YEAR WITH DR. END You will receive a reminder letter in the mail two months in advance. If you don't receive a letter, please call our office to schedule the follow-up appointment.  Any Other Special Instructions Will Be Listed Below (If Applicable).  If you need a refill on your cardiac medications before your next appointment, please call your pharmacy.

## 2016-06-03 NOTE — Progress Notes (Signed)
Cardiology Office Note:    Date:  06/03/2016   ID:  James Rodgers, DOB Sep 04, 1950, MRN [id number]  PCP:  James Lopes, MD  Cardiologist:  Dr. [name] >> Dr. Harrell Gave End   Electrophysiologist:  n/a  Referring MD: James Battles, MD   Chief Complaint  Patient presents with  . Coronary Artery Disease    Follow Up  . Thoracic Aortic Aneurysm    Follow up     History of Present Illness:    James Rodgers is a 66 y.o. male with a hx of nonobstructive CAD by cardiac catheterization in 2003, diabetes, HL. Myoview in 2010 was negative for ischemia. In 5/17 he complained of lightheadedness and his olmesartan was reduced to 10 mg. Echocardiogram demonstrated normal LV function with mild diastolic dysfunction and dilated ascending aorta 45 mm. Follow-up chest MRA demonstrated ascending thoracic aortic aneurysm measuring 40 mm. Nuclear stress test in 8/17 demonstrated mild inferoseptal and apical ischemia, EF 53%.  This was a low risk study and Dr. Aundra Rodgers felt the patient he be treated medically unless he had worsening symptoms. Last seen in 9/17.  He returns for Cardiology follow up.   He is here alone today.  He denies any chest pain, shortness of breath, syncope, orthopnea, PND, edema.  He denies bleeding issues.  He denies dizziness.    Prior CV studies:   The following studies were reviewed today:  Nuc Stress Study 10/16/15 Low risk stress nuclear study with mild ischemia in the basal inferoseptal wall and mild ischemia in the apical wall; EF 53 with mild global hypokinesis and mild LVE.  Chest MRA 07/13/15 IMPRESSION: Mild uncomplicated fusiform aneurysmal dilatation of the ascending thoracic aorta measuring 40 mm in maximal diameter. Recommend annual imaging followup by CTA or MRA.   Echo 07/27/15 Mod LVH, EF 55-60, no RWMA, Gr 1 DD, Asc Ao 45 mm, mild MR, mild LAE, mild RVE  Myoview 10/10 Normal, EF 63%  LHC 11/03 LM normal LAD 40 then aneurysmal  dilatation, mid 30-50; Dx with proximal 50-60, mid aneurysmal dilatation LCx irregs RI irregs RCA with PDA mid 40 EF 55-60  Past Medical History:  Diagnosis Date  . Anal fissure   . Arthritis   . CAD (coronary artery disease)    a. 2003 Cath: LAD 50, D1 50-60, RCA 40;  b. 12/2008 MV: no ischemia/infarct, EF 63%. // c. MV 8/17: mild inf-sept and apical ischemia, EF 53%, Low Risk  . Diabetes type 2, controlled ([place])   . GERD (gastroesophageal reflux disease)   . History of nuclear stress test    a. Myoview 8/17: EF 53%, mild inferoseptal and mild apical ischemia, low risk  . HLD (hyperlipidemia)   . HTN (hypertension)   . LBP (low back pain)    a. h/o surgery in 1991.  . Obesity   . Osteoarthritis    knees  . Sleep apnea    a. on cpap.  . Thoracic aortic aneurysm ([place])    a. MRA 5/17 ascending thoracic aorta measuring 40 mm - recommend annual follow-up // b.  Echo 5/17: Moderate concentric LVH, EF 55-60%, normal wall motion, grade 1 diastolic dysfunction, ascending aortic diameter 45 mm, mild MR, mild LAE    Past Surgical History:  Procedure Laterality Date  . anal fissure surgery    . back surgery    . NASAL SINUS SURGERY      Current Medications: Current Meds  Medication Sig  . aspirin EC 81 MG tablet Take  1 tablet (81 mg total) by mouth daily.  [name] atorvastatin (LIPITOR) 40 MG tablet Take 40 mg by mouth daily.  . clopidogrel (PLAVIX) 75 MG tablet Take 75 mg by mouth daily.  [name] esomeprazole (NEXIUM) 40 MG capsule Take 40 mg by mouth daily.    [name] FARXIGA 10 MG TABS tablet Take 10 mg by mouth daily.   . fexofenadine (ALLEGRA) 180 MG tablet Take 180 mg by mouth daily as needed (for allergies).   . metFORMIN (GLUMETZA) 500 MG (MOD) 24 hr tablet Take 500 mg by mouth 2 (two) times daily with a meal.  . metoprolol succinate (TOPROL-XL) 100 MG 24 hr tablet TAKE 1 TABLET DAILY  . minoxidil (LONITEN) 2.5 MG tablet Take 2.5 mg by mouth daily.  . mometasone (NASONEX) 50 MCG/ACT nasal  spray Place 2 sprays into the nose daily as needed (for allergies).   . olmesartan (BENICAR) 20 MG tablet Take 1/2 tablet (10 mg total) by mouth daily  . tamsulosin (FLOMAX) 0.4 MG CAPS capsule Take 0.4 mg by mouth daily.     Allergies:   Patient has no known allergies.   Social History   Social History  . Marital status: Married    Spouse name: N/A  . Number of children: N/A  . Years of education: N/A   Social History Main Topics  . Smoking status: Never Smoker  . Smokeless tobacco: Never Used  . Alcohol use No  . Drug use: No  . Sexual activity: Not Asked   Other Topics Concern  . None   Social History Narrative   Married; retired Agricultural consultant; daily caffeine use.  Lives with wife in [place].  Does not routinely exercise.     Family History  Problem Relation Age of Onset  . Heart disease Father   . Diabetes      GM     ROS:   Please see the history of present illness.    Review of Systems  Musculoskeletal: Positive for back pain and myalgias.   All other systems reviewed and are negative.   EKGs/Labs/Other Test Reviewed:    EKG:  EKG is  ordered today.  The ekg ordered today demonstrates sinus brady, HR 57, QTc 402 ms, no change from prior tracing  Recent Labs: 08/02/2015: Creatinine, Ser 1.09   Recent Lipid Panel    Component Value Date/Time   CHOL 144 08/21/2015 0930   TRIG 184 (H) 08/21/2015 0930   HDL 38 (L) 08/21/2015 0930   CHOLHDL 3.8 08/21/2015 0930   VLDL 37 (H) 08/21/2015 0930   LDLCALC 69 08/21/2015 0930    Physical Exam:    VS:  BP 130/80   Pulse (!) 57   Ht 6\' 5"  (1.956 m)   Wt 295 lb 12.8 oz (134.2 kg)   BMI 35.08 kg/m     Wt Readings from Last 3 Encounters:  06/03/16 295 lb 12.8 oz (134.2 kg)  11/27/15 298 lb (135.2 kg)  10/16/15 (!) 306 lb (138.8 kg)     Physical Exam  Constitutional: He is oriented to person, place, and time. He appears well-developed and well-nourished. No distress.  HENT:  Head: Normocephalic and atraumatic.    Eyes: No scleral icterus.  Neck: Normal range of motion. No JVD present. Carotid bruit is not present.  Cardiovascular: Normal rate, regular rhythm, S1 normal and S2 normal.   No murmur heard. Pulmonary/Chest: Effort normal and breath sounds normal. He has no wheezes. He has no rhonchi. He has no rales.  Abdominal: Soft. There is no tenderness.  Musculoskeletal: He exhibits no edema.  Neurological: He is alert and oriented to person, place, and time.  Skin: Skin is warm and dry.  Psychiatric: He has a normal mood and affect.    ASSESSMENT:    1. Coronary artery disease involving native coronary artery of native heart without angina pectoris   2. Thoracic aortic aneurysm without rupture ([place])   3. Essential hypertension    PLAN:    In order of problems listed above:  1. Coronary artery disease involving native coronary artery of native heart without angina pectoris - Mod nonobstructive CAD at cath in 2003 and low risk Myoview in 8/17.  He denies angina.  He has been on ASA + Plavix long term without significant issues and has been left on this regimen.  Continue ASA, Plavix, statin, beta-blocker.    2. Thoracic aortic aneurysm without rupture ([place]) - Measured 40 mm by MRA in 5/17.  Repeat is due in 07/2016.    3. Essential hypertension - BP is controlled.  He notes dizziness in the summer when he is outside and sweating profusely.  He has recorded low BPs with this.  We discussed adequate hydration.  He can also take 1/2 dose Metoprolol the night before and skip his Benicar the day of his activity.  His PCP suggested holding his [place] the day of as well.    Dispo:  Return in about 1 year (around 06/03/2017) for w/ Dr. Saunders Revel.   Medication Adjustments/Labs and Tests Ordered: Current medicines are reviewed at length with the patient today.  Concerns regarding medicines are outlined above.  Medication changes, Labs and Tests ordered today are outlined in the Patient Instructions noted  below. Patient Instructions  Medication Instructions:  Your physician recommends that you continue on your current medications as directed. Please refer to the Current Medication list given to you today.  Labwork: NONE ORDERED  Testing/Procedures: YOU WILL NEED TO BE SET UP FOR A MRA CHEST WITH AND W/O CONTRAST TO BE DONE AT [place] 08/2016  Follow-Up: Your physician wants you to follow-up in: 1 YEAR WITH DR. END You will receive a reminder letter in the mail two months in advance. If you don't receive a letter, please call our office to schedule the follow-up appointment.  Any Other Special Instructions Will Be Listed Below (If Applicable).  If you need a refill on your cardiac medications before your next appointment, please call your pharmacy.  Signed, Richardson Dopp, PA-C  06/03/2016 10:47 AM    [facility] [address], [place], [place]  [address] Phone: [phone]; Fax: [phone]

## 2016-06-07 ENCOUNTER — Other Ambulatory Visit: Payer: Self-pay | Admitting: Cardiology

## 2016-06-07 DIAGNOSIS — E785 Hyperlipidemia, unspecified: Secondary | ICD-10-CM

## 2016-06-07 DIAGNOSIS — I2510 Atherosclerotic heart disease of native coronary artery without angina pectoris: Secondary | ICD-10-CM

## 2016-08-13 ENCOUNTER — Encounter: Payer: Self-pay | Admitting: Physician Assistant

## 2016-08-13 ENCOUNTER — Ambulatory Visit (HOSPITAL_COMMUNITY)
Admission: RE | Admit: 2016-08-13 | Discharge: 2016-08-13 | Disposition: A | Discharge status: Home / Self Care | Payer: Medicare Other | Source: Ambulatory Visit | Attending: Physician Assistant | Admitting: Physician Assistant

## 2016-08-13 DIAGNOSIS — I712 Thoracic aortic aneurysm, without rupture, unspecified: Secondary | ICD-10-CM

## 2016-08-13 LAB — CREATININE, SERUM
Creatinine, Ser: 1.05 mg/dL (ref 0.61–1.24)
GFR calc Af Amer: >60 mL/min (ref >60)
GFR calc non Af Amer: >60 mL/min (ref >60)

## 2016-08-13 MED ORDER — GADOBENATE DIMEGLUMINE 529 MG/ML IV SOLN
20.0000 mL | Freq: Once | INTRAVENOUS | Status: AC | PRN
  Administered 2016-08-13: 20 mL via INTRAVENOUS

## 2016-08-14 ENCOUNTER — Other Ambulatory Visit: Payer: Self-pay | Admitting: Physician Assistant

## 2016-08-14 ENCOUNTER — Telehealth: Payer: Self-pay | Admitting: *Deleted

## 2016-08-14 DIAGNOSIS — I712 Thoracic aortic aneurysm, without rupture, unspecified: Secondary | ICD-10-CM

## 2016-08-14 NOTE — Telephone Encounter (Signed)
Pt has been notified of both lab results and MRA of chest results. Pt aware Ascending Aortic Aneurysm is stable when compared to study in 2017. Pt is agreeable to repeat MRA of chest to be done in 1 year. Pt also has a recall in epic for Dr. Saunders Revel in 1 year. Pt thanked me for my call today.

## 2016-08-14 NOTE — Telephone Encounter (Signed)
-----   Message from Liliane Shi, [place] sent at 08/13/2016  5:03 PM EDT ----- Please call the patient Kidney function is normal. Richardson Dopp, PA-C    08/13/2016 5:03 PM

## 2016-08-14 NOTE — Addendum Note (Signed)
Addended byKathlen Mody, Nicki Reaper T on: 08/14/2016 03:59 PM   Modules accepted: Orders

## 2016-08-14 NOTE — Telephone Encounter (Signed)
Patient needs Chest CTA in 1 year. I cancelled MRA and ordered CTA. Please make sure it is scheduled correctly. Richardson Dopp, PA-C    08/14/2016 3:59 PM

## 2017-01-10 ENCOUNTER — Encounter: Payer: Self-pay | Admitting: Neurology

## 2017-01-14 ENCOUNTER — Encounter: Payer: Self-pay | Admitting: Neurology

## 2017-01-14 ENCOUNTER — Ambulatory Visit: Payer: Medicare Other | Admitting: Neurology

## 2017-01-14 VITALS — BP 134/84 | HR 70 | Ht 77.0 in | Wt 288.0 lb

## 2017-01-14 DIAGNOSIS — I712 Thoracic aortic aneurysm, without rupture, unspecified: Secondary | ICD-10-CM

## 2017-01-14 DIAGNOSIS — G4733 Obstructive sleep apnea (adult) (pediatric): Secondary | ICD-10-CM | POA: Diagnosis not present

## 2017-01-14 DIAGNOSIS — Z9989 Dependence on other enabling machines and devices: Secondary | ICD-10-CM

## 2017-01-14 DIAGNOSIS — E66812 Obesity, class 2: Secondary | ICD-10-CM

## 2017-01-14 NOTE — Patient Instructions (Signed)

## 2017-01-14 NOTE — Progress Notes (Signed)
SLEEP MEDICINE CLINIC   Provider:  Larey Seat, M D  Primary Care Physician:  Leanna Battles, MD   Referring Provider: Leanna Battles, MD   Chief Complaint  Patient presents with  . New Patient (Initial Visit)    pt alone, sleept study was done 2010 with Dr Brett Fairy. pt has been using CPAP until recently the machine has messed up and he is needing another one.     HPI:  James Rodgers is a 66 y.o. male , seen here as in a referral  from Dr. Philip Aspen to re- initiate sleep care.  I have the pleasure of seeing Mr. James Rodgers today on 14 January 2017, a patient I last encountered in February 2010, at the time referred by Dr. Dagmar Hait. The patient was 66 years old at the time, with a diagnosis of hypertension, hyperlipidemia, diabetes type 2, allergic rhinitis and existing diagnosis of obstructive sleep apnea on CPAP.  Altogether he used a setting between 12 and 14 cm water but had started to snore loudly again in spite of using CPAP. He had endorsed the Epworth sleepiness scale at the time at 16 points indicating a high degree of daytime sleepiness.  A polysomnogram documented severe apnea to be still present with an AHI of 70.7/h only mildly accentuated by REM sleep and not depending on sleep position.  He did not have protracted oxygen desaturation but very loud snoring.  He stopped CPAP the same night reducing his AHI to 2.5 at a low pressure setting.  He was also fitted at the time with a Respironics comfort gel nasal mask.  He continues to use a nasal mask and continues to use the machine I prescribed almost 9 years ago.  He would be definitely due for a new machine now.  He continued to use 10 cm water pressure until now, highly compliant until his machine broke.   Chief complaint according to patient :" I need a new machine and my wife is bothered if I don't use CPAP "  Sleep habits are as follows: [name] usually reads in bed for about a half hour, his bedtime is  between 1030 and 11 PM, he has no trouble initiating sleep and usually can sleep through the night except for 1 or 2 nocturia breaks.  Since his machine broke he has to unplug and re-plotted in multiple times.  He does not use a humidifier feature after he developed condensation water in tube and in the mouth.  He sleeps on a flat mattress, with one pillow only and usually rests on his side- the bedroom is cool, quiet and dark.  He shares a bedroom with his spouse. The patient usually arises at 7 AM.    Sleep medical history and family sleep history: Variable blood pressures, the last one taken at Dr. Buel Ream office was 147/93 mmHg, yesterday at an ophthalmologist office 010 systolic was measured.  HbA1c was 5.9, definitely elevated, the patient has some osteoarthritic degenerative joint disease in the knees, he is up-to-date with vaccinations does not have radiculitis, weakness, paresthesias, seizures or fainting spells, palpitations or diaphoresis or nausea but no vertigo.  He carries a diagnosis of coronary artery disease- but stable by angiogram. Aorta enlarged.    Social history: Mr. James Rodgers is now retired, he drinks 1 or maybe 2 coffees in the morning and may have a caffeinated soft drink later in the day.  He drinks decaffeinated ice tea.  Does not drink energy drinks.  He does  not consume alcohol, no tobacco products.  Has never been a smoker or has used other forms of tobacco. 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    Review of Systems: Out of a complete 14 system review, the patient complains of only the following symptoms, and all other reviewed systems are negative.  Snoring if not on CPAP-    Epworth score 7 , Fatigue severity score 25  , depression score 2/15    Social History   Socioeconomic History  . Marital status: Married    Spouse name: Not on file  . Number of children: Not on file  . Years of education: Not on file  . Highest education level: Not on file  Social Needs    . Financial resource strain: Not on file  . Food insecurity - worry: Not on file  . Food insecurity - inability: Not on file  . Transportation needs - medical: Not on file  . Transportation needs - non-medical: Not on file  Occupational History  . Not on file  Tobacco Use  . Smoking status: Never Smoker  . Smokeless tobacco: Never Used  Substance and Sexual Activity  . Alcohol use: No  . Drug use: No  . Sexual activity: Not on file  Other Topics Concern  . Not on file  Social History Narrative   Married; retired Agricultural consultant; daily caffeine use.  Lives with wife in [place].  Does not routinely exercise.    Family History  Problem Relation Age of Onset  . Heart disease Father   . Diabetes Unknown        GM    Past Medical History:  Diagnosis Date  . Anal fissure   . Arthritis   . CAD (coronary artery disease)    a. 2003 Cath: LAD 50, D1 50-60, RCA 40;  b. 12/2008 MV: no ischemia/infarct, EF 63%. // c. MV 8/17: mild inf-sept and apical ischemia, EF 53%, Low Risk  . Diabetes type 2, controlled ([place])   . GERD (gastroesophageal reflux disease)   . History of nuclear stress test    a. Myoview 8/17: EF 53%, mild inferoseptal and mild apical ischemia, low risk  . HLD (hyperlipidemia)   . HTN (hypertension)   . LBP (low back pain)    a. h/o surgery in 1991.  . Obesity   . Osteoarthritis    knees  . Sleep apnea    a. on cpap.  . Thoracic aortic aneurysm ([place])    a. MRA 5/17 ascending thoracic aorta measuring 40 mm - recommend annual follow-up // b.  Echo 5/17: Moderate concentric LVH, EF 55-60%, normal wall motion, grade 1 diastolic dysfunction, ascending aortic diameter 45 mm, mild MR, mild LAE // c. Chest MRA 6/18: stable ascending aortic aneurysm measuring 4.1 cm (4.0 cm in 5/17). FU 1 year.    Past Surgical History:  Procedure Laterality Date  . anal fissure surgery    . back surgery    . NASAL SINUS SURGERY      Current Outpatient Medications  Medication Sig Dispense  Refill  . aspirin EC 81 MG tablet Take 1 tablet (81 mg total) by mouth daily.    [name] atorvastatin (LIPITOR) 40 MG tablet Take 40 mg by mouth daily.    . clopidogrel (PLAVIX) 75 MG tablet Take 75 mg by mouth daily.    [name] esomeprazole (NEXIUM) 40 MG capsule Take 40 mg by mouth daily.      [name] FARXIGA 10 MG TABS tablet Take 10 mg  by mouth daily.     . fexofenadine (ALLEGRA) 180 MG tablet Take 180 mg by mouth daily as needed (for allergies).     . metFORMIN (GLUMETZA) 500 MG (MOD) 24 hr tablet Take 500 mg by mouth 2 (two) times daily with a meal.    . metoprolol succinate (TOPROL-XL) 100 MG 24 hr tablet TAKE 1 TABLET DAILY 90 tablet 3  . minoxidil (LONITEN) 2.5 MG tablet Take 2.5 mg by mouth daily.    . mometasone (NASONEX) 50 MCG/ACT nasal spray Place 2 sprays into the nose daily as needed (for allergies).     . olmesartan (BENICAR) 20 MG tablet TAKE ONE-HALF TABLET BY  MOUTH DAILY 45 tablet 3  . tamsulosin (FLOMAX) 0.4 MG CAPS capsule Take 0.4 mg by mouth daily.     No current facility-administered medications for this visit.     Allergies as of 01/14/2017  . (No Known Allergies)    Vitals: BP 134/84   Pulse 70   Ht 6\' 5"  (1.956 m)   Wt 288 lb (130.6 kg)   BMI 34.15 kg/m  Last Weight:  Wt Readings from Last 1 Encounters:  01/14/17 288 lb (130.6 kg)   MVH:QION mass index is 34.15 kg/m.     Last Height:   Ht Readings from Last 1 Encounters:  01/14/17 6\' 5"  (1.956 m)    Physical exam:  General: The patient is awake, alert and appears not in acute distress. The patient is well groomed. Head: Normocephalic, atraumatic. Neck is supple. Mallampati 2 -the uvula elevates without appearing edematous, the airway is short.,  neck circumference:20. Nasal airflow patent,  Retrognathia is not seen.  Cardiovascular:  Regular rate and rhythm  without  murmurs or carotid bruit, and without distended neck veins. Respiratory: Lungs are clear to auscultation. Skin:  Without evidence of edema, or  rash Trunk: BMI is 34.15 . The patient's posture is erect   Neurologic exam : The patient is awake and alert, oriented to place and time.   Memory subjective described as intact.  Mood and affect are appropriate.  Cranial nerves: Pupils are equal and briskly reactive to light. Funduscopic exam without  evidence of pallor or edema. Extraocular movements  in vertical and horizontal planes intact and without nystagmus. Visual fields by finger perimetry are intact. Hearing to finger rub intact.  Facial sensation intact to fine touch. Facial motor strength is symmetric and tongue and uvula move midline. Shoulder shrug was symmetrical.   Motor exam: Normal tone, muscle bulk and symmetric strength in all extremities. Sensory:  Fine touch, pinprick and vibration were tested in all extremities. Coordination: Rapid alternating movements/ Finger-to-nose maneuver were normal without evidence of ataxia, dysmetria or tremor. Gait and station: Patient walks without assistive device and is able unassisted to climb up to the exam table. Strength within normal limits. Stance is stable and normal.  Toe and hell stand were tested . Turns with  3 Steps. He feels as if drifting to the left after standing up.  Deep tendon reflexes: in the  upper and lower extremities are symmetric and intact. Babinski maneuver response is downgoing.    Assessment:  After physical and neurologic examination, review of laboratory studies,  Personal review of imaging studies, reports of other /same  Imaging studies, results of polysomnography and / or neurophysiology testing and pre-existing records as far as provided in visit., my assessment is   1) Mr. barg carries a diagnosis of obstructive sleep apnea for well over 20 years  now, he is definitely due for a new CPAP machine.  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  The patient will need to be evaluated in a split-night  polysomnography, and I will be happy to provide to the CPAP as soon as possible.  I will ask our lab to put him in before December 31 of this year.  2) Obesity- weight loss attempts- he lost already 30 pounds last year, by exercises and keeps a calorie count.   3) HTN- variable. May be better controlled ( as well as diabetes) if the patient has optimal OSA treatment again.    The patient was advised of the nature of the diagnosed disorder , the treatment options and the  risks for general health and wellness arising from not treating the condition.   I spent more than 45  minutes of face to face time with the patient.  Greater than 50% of time was spent in counseling and coordination of care. We have discussed the diagnosis and differential and I answered the patient's questions.    Plan:  Treatment plan and additional workup :  SPLIT night, SPLIT at AHI 20 - patient is a current and longstanding CPAP user, prefers nasal mask or pillow.   MD follow up.   Larey Seat, MD 93/04/3555, 3:22 AM  Certified in Neurology by ABPN Certified in [place] by [facility] [address], [place], [place] [address]

## 2017-02-18 ENCOUNTER — Ambulatory Visit (INDEPENDENT_AMBULATORY_CARE_PROVIDER_SITE_OTHER): Payer: Medicare Other | Admitting: Neurology

## 2017-02-18 DIAGNOSIS — I712 Thoracic aortic aneurysm, without rupture, unspecified: Secondary | ICD-10-CM

## 2017-02-18 DIAGNOSIS — G4733 Obstructive sleep apnea (adult) (pediatric): Secondary | ICD-10-CM

## 2017-02-18 DIAGNOSIS — Z9989 Dependence on other enabling machines and devices: Principal | ICD-10-CM

## 2017-02-26 ENCOUNTER — Other Ambulatory Visit: Payer: Self-pay | Admitting: Neurology

## 2017-02-26 DIAGNOSIS — I712 Thoracic aortic aneurysm, without rupture, unspecified: Secondary | ICD-10-CM

## 2017-02-26 DIAGNOSIS — R55 Syncope and collapse: Secondary | ICD-10-CM

## 2017-02-26 DIAGNOSIS — Z9989 Dependence on other enabling machines and devices: Principal | ICD-10-CM

## 2017-02-26 DIAGNOSIS — G4733 Obstructive sleep apnea (adult) (pediatric): Secondary | ICD-10-CM

## 2017-02-26 DIAGNOSIS — I2510 Atherosclerotic heart disease of native coronary artery without angina pectoris: Secondary | ICD-10-CM

## 2017-02-26 NOTE — Procedures (Signed)
PATIENT'S NAME:  Littleton, Haub DOB:      [birth year]      MR#:    [id number]     DATE OF RECORDING: 02/18/2017 REFERRING M.D.:  Leanna Battles MD Study Performed:   Baseline Polysomnogram HISTORY:  66 year old patient of Dr. Shon Baton presenting for re-evaluation of CPAP/ OSA. He has been evaluated at [facility] 9 years prior, diagnosed with OSA, AHI of 70.0/hr. and was titrated to 14 cm CPAP, he still used the same machine with a nasal mask. This study was ordered as a SPLIT at AHI 20, 3%.  The patient endorsed the Epworth Sleepiness Scale at 7 points.   The patient's weight 289 pounds with a height of 77 (inches), resulting in a BMI of 34.1 kg/m2. The patient's neck circumference measured 20 inches.  CURRENT MEDICATIONS: Aspirin, Lipitor, Plavix, Nexium, Farxiga, Allegra, Metformin, Loniten, Toprol, Nasonex, Benicar, Flomax   PROCEDURE:  This is a multichannel digital polysomnogram utilizing the Somnostar 11.2 system.  Electrodes and sensors were applied and monitored per AASM Specifications.   EEG, EOG, Chin and Limb EMG, were sampled at 200 Hz.  ECG, Snore and Nasal Pressure, Thermal Airflow, Respiratory Effort, CPAP Flow and Pressure, Oximetry was sampled at 50 Hz. Digital video and audio were recorded.      BASELINE STUDY : Lights Out was at 22:24 and Lights On at 04:47.  Total recording time (TRT) was 383.5 minutes, with a total sleep time (TST) of 262.5 minutes.   The patient's sleep latency was 34 minutes.  REM latency was 71.5 minutes.  The sleep efficiency was 68.4 %.     SLEEP ARCHITECTURE: WASO (Wake after sleep onset) was 71.5 minutes.  There were 34 minutes in Stage N1, 69.5 minutes Stage N2, 115.5 minutes Stage N3 and 43.5 minutes in Stage REM.  The percentage of Stage N1 was 13.%, Stage N2 was 26.5%, Stage N3 was 44.% and Stage R (REM sleep) was 16.6%.  RESPIRATORY ANALYSIS:  There were a total of 46 respiratory events:  0 apneas and 46 hypopneas with 0 respiratory event  related arousals (RERAs).     The total APNEA/HYPOPNEA INDEX (AHI) was 10.5/hour and the total RESPIRATORY DISTURBANCE INDEX was 10.5 /hour.  17 events occurred in REM sleep and 58 events in NREM. The REM AHI was 23.4 /hour, versus a non-REM AHI of 7.9.  The patient spent 0 minutes of total sleep time in the supine position and 263 minutes in non-supine. The supine AHI was 0.0 versus a non-supine AHI of 10.5.  OXYGEN SATURATION & C02:  The Wake baseline 02 saturation was 96%, with the lowest being 84%.  Time spent below 89% saturation equaled 46 minutes.   PERIODIC LIMB MOVEMENTS:   The patient had a total of 14 Periodic Limb Movements.  The Periodic Limb Movement (PLM) index was 3.2 and the PLM Arousal index was 0.5/hour. The arousals were noted as: 40 were spontaneous, 2 were associated with PLMs, and 10 were associated with respiratory events.  Audio and video analysis did not show any abnormal or unusual movements, behaviors, phonations or vocalizations.  The patient took one bathroom break. Loud Snoring was noted. EKG was in keeping with normal sinus rhythm (NSR). Post-study, the patient indicated that sleep was the same as usual.    IMPRESSION:  1. Obstructive Sleep Apnea (OSA) is much milder than 9 years ago- AHI was 10.5 and in REM AHI was 23.4/hr.  2. Loud Primary Snoring. 3. Hypoxemia   RECOMMENDATIONS: Auto  CPAP 5-15 cm water with nasal pillow or nasal mask of patient's choice.  We would not recommend dental device or ENT surgery due to REM accentuated and hypoxemia associated apnea form.   1. Further information regarding OSA may be obtained from [employer] (www.sleepfoundation.org) or American Sleep Apnea Association (www.sleepapnea.org). 2. A follow up appointment will be scheduled in the Sleep Clinic at [facility]. The referring provider will be notified of the results.      I certify that I have reviewed the entire raw data recording prior  to the issuance of this report in accordance with the Standards of Accreditation of the American Academy of Sleep Medicine (AASM)    Larey Seat, MD   02-26-2017  Diplomat, American Board of Psychiatry and Neurology  Diplomat, American Board of [place] Director, [facility] [employer]

## 2017-02-27 ENCOUNTER — Telehealth: Payer: Self-pay | Admitting: Neurology

## 2017-02-27 NOTE — Telephone Encounter (Signed)
-----   Message from Larey Seat, MD sent at 02/26/2017  6:38 PM EST ----- IMPRESSION:  1. Obstructive Sleep Apnea (OSA) is much milder than 9 years ago-  AHI was 10.5 and in REM AHI was 23.4/hr.  2. Loud Primary Snoring. 3. Hypoxemia   RECOMMENDATIONS: Auto CPAP 5-15 cm water with nasal pillow or  nasal mask of patient's choice. We would not recommend dental  device or ENT surgery due to REM accentuated and hypoxemia  associated apnea form.

## 2017-02-27 NOTE — Telephone Encounter (Signed)
I called James Rodgers. I advised James Rodgers that Dr. Brett Fairy reviewed their sleep study results and found that James Rodgers has sleep apnea. Dr. Brett Fairy recommends that James Rodgers starts CPAP. I reviewed PAP compliance expectations with the James Rodgers. James Rodgers is agreeable to starting a CPAP. I advised James Rodgers that an order will be sent to a DME, [facility], and [facility] will call the James Rodgers within about one week after they file with the James Rodgers's insurance. [facility] will show the James Rodgers how to use the machine, fit for masks, and troubleshoot the CPAP if needed. A follow up appt was made for insurance purposes with Cecille Rubin, NP on May 26, 2017 at 8:45 am. James Rodgers verbalized understanding to arrive 15 minutes early and bring their CPAP. A letter with all of this information in it will be mailed to the James Rodgers as a reminder. I verified with the James Rodgers that the address we have on file is correct. James Rodgers verbalized understanding of results. James Rodgers had no questions at this time but was encouraged to call back if questions arise.

## 2017-04-07 ENCOUNTER — Other Ambulatory Visit: Payer: Self-pay | Admitting: Dermatology

## 2017-05-22 NOTE — Progress Notes (Signed)
[facility]  PATIENT: James Rodgers DOB: 29-Oct-1950   REASON FOR VISIT: Follow-up for obstructive sleep apnea with initial CPAP compliance for new CPAP machine HISTORY FROM: Patient    HISTORY OF PRESENT ILLNESS:UPDATE 3/18/2019CM James Rodgers, 67 year old male returns for follow-up with history of obstructive sleep apnea here with new CPAP machine with initial CPAP compliance.  Data dated 04/25/2017 -05/24/2017 shows compliance greater than 4 hours 100%.  Average usage 7 hours 25 minutes pressure 5-15 cm.  EPR level 3 AHI 0.1 leaks 95th percentile 7.1.  Patient's only comment is that he had nasal pillows previously and he felt he got more air with those.  With his current mask he does not feel like he is getting enough air when he first goes to sleep but he sleeps well.  He was advised to go to [facility] and ask about nasal pillows.  He returns for reevaluation. 11/6/18CDMr. Rodgers Rodgers today on 14 January 2017, a patient I last encountered in February 2010, at the time referred by Dr. Dagmar Rodgers. The patient was 67 years old at the time, with a diagnosis of hypertension, hyperlipidemia, diabetes type 2, allergic rhinitis and existing diagnosis of obstructive sleep apnea on CPAP.  Altogether he used a setting between 12 and 14 cm water but had started to snore loudly again in spite of using CPAP. He had endorsed the Epworth sleepiness scale at the time at 16 points indicating a high degree of daytime sleepiness.  A polysomnogram documented severe apnea to be still present with an AHI of 70.7/h only mildly accentuated by REM sleep and not depending on sleep position.  He did not have protracted oxygen desaturation but very loud snoring.  He stopped CPAP the same night reducing his AHI to 2.5 at a low pressure setting.  He was also fitted at the time with a Respironics comfort gel nasal mask.  He continues to use a nasal mask and continues to use the machine I prescribed almost  9 years ago.  He would be definitely due for a new machine now.  He continued to use 10 cm water pressure until now, highly compliant until his machine broke.   Chief complaint according to patient :" I need a new machine and my wife is bothered if I don't use CPAP "  Sleep habits are as follows: James Rodgers usually reads in bed for about a half hour, his bedtime is between 1030 and 11 PM, he has no trouble initiating sleep and usually can sleep through the night except for 1 or 2 nocturia breaks.  Since his machine broke he has to unplug and re-plotted in multiple times.  He does not use a humidifier feature after he developed condensation water in tube and in the mouth.  He sleeps on a flat mattress, with one pillow only and usually rests on his side- the bedroom is cool, quiet and dark.  He shares a bedroom with his spouse. The patient usually arises at 7 AM.   REVIEW OF SYSTEMS: Full 14 system review of systems performed and notable only for those listed, all others are neg:  Constitutional: neg  Cardiovascular: neg Ear/Nose/Throat: neg  Skin: neg Eyes: neg Respiratory: neg Gastroitestinal: neg  Hematology/Lymphatic: Easy bruising Endocrine: neg Musculoskeletal: Joint pain Allergy/Immunology: Environmental allergies Neurological: neg Psychiatric: neg Sleep : Obstructive sleep apnea with CPAP   ALLERGIES: No Known Allergies  HOME MEDICATIONS: Outpatient Medications Prior to Visit  Medication Sig Dispense Refill  . aspirin EC 81  MG tablet Take 1 tablet (81 mg total) by mouth daily.    [name] atorvastatin (LIPITOR) 40 MG tablet Take 40 mg by mouth daily.    . clopidogrel (PLAVIX) 75 MG tablet Take 75 mg by mouth daily.    [name] esomeprazole (NEXIUM) 40 MG capsule Take 40 mg by mouth daily.      [name] FARXIGA 10 MG TABS tablet Take 10 mg by mouth daily.     . fexofenadine (ALLEGRA) 180 MG tablet Take 180 mg by mouth daily as needed (for allergies).     . metFORMIN (GLUMETZA) 500 MG  (MOD) 24 hr tablet Take 500 mg by mouth 2 (two) times daily with a meal.    . metoprolol succinate (TOPROL-XL) 100 MG 24 hr tablet TAKE 1 TABLET DAILY 90 tablet 3  . minoxidil (LONITEN) 2.5 MG tablet Take 2.5 mg by mouth daily.    . mometasone (NASONEX) 50 MCG/ACT nasal spray Place 2 sprays into the nose daily as needed (for allergies).     . olmesartan (BENICAR) 20 MG tablet TAKE ONE-HALF TABLET BY  MOUTH DAILY 45 tablet 3  . tamsulosin (FLOMAX) 0.4 MG CAPS capsule Take 0.4 mg by mouth daily.     No facility-administered medications prior to visit.     PAST MEDICAL HISTORY: Past Medical History:  Diagnosis Date  . Anal fissure   . Arthritis   . CAD (coronary artery disease)    a. 2003 Cath: LAD 50, D1 50-60, RCA 40;  b. 12/2008 MV: no ischemia/infarct, EF 63%. // c. MV 8/17: mild inf-sept and apical ischemia, EF 53%, Low Risk  . Diabetes type 2, controlled ([place])   . GERD (gastroesophageal reflux disease)   . History of nuclear stress test    a. Myoview 8/17: EF 53%, mild inferoseptal and mild apical ischemia, low risk  . HLD (hyperlipidemia)   . HTN (hypertension)   . LBP (low back pain)    a. h/o surgery in 1991.  . Obesity   . Osteoarthritis    knees  . Sleep apnea    a. on cpap.  . Thoracic aortic aneurysm ([place])    a. MRA 5/17 ascending thoracic aorta measuring 40 mm - recommend annual follow-up // b.  Echo 5/17: Moderate concentric LVH, EF 55-60%, normal wall motion, grade 1 diastolic dysfunction, ascending aortic diameter 45 mm, mild MR, mild LAE // c. Chest MRA 6/18: stable ascending aortic aneurysm measuring 4.1 cm (4.0 cm in 5/17). FU 1 year.    PAST SURGICAL HISTORY: Past Surgical History:  Procedure Laterality Date  . anal fissure surgery    . back surgery    . NASAL SINUS SURGERY      FAMILY HISTORY: Family History  Problem Relation Age of Onset  . Heart disease Father   . Diabetes Unknown        GM    SOCIAL HISTORY: Social History   Socioeconomic  History  . Marital status: Married    Spouse name: Not on file  . Number of children: Not on file  . Years of education: Not on file  . Highest education level: Not on file  Social Needs  . Financial resource strain: Not on file  . Food insecurity - worry: Not on file  . Food insecurity - inability: Not on file  . Transportation needs - medical: Not on file  . Transportation needs - non-medical: Not on file  Occupational History  . Not on file  Tobacco Use  .  Smoking status: Never Smoker  . Smokeless tobacco: Never Used  Substance and Sexual Activity  . Alcohol use: No  . Drug use: No  . Sexual activity: Not on file  Other Topics Concern  . Not on file  Social History Narrative   Married; retired Agricultural consultant; daily caffeine use.  Lives with wife in [place]. Goes to the gym about 2 times a week.     PHYSICAL EXAM  Vitals:   05/26/17 0737  BP: 122/76  Pulse: 65  Weight: 293 lb (132.9 kg)  Height: 6\' 5"  (1.956 m)   Body mass index is 34.74 kg/m.  Generalized: Well developed, obese male in no acute distress  Head: normocephalic and atraumatic,. Oropharynx benign  Neck: Supple,  Musculoskeletal: No deformity   Neurological examination   Mentation: Alert oriented to time, place, history taking. Attention span and concentration appropriate. Recent and remote memory intact.  Follows all commands speech and language fluent.   Cranial nerve II-XII: Pupils were equal round reactive to light extraocular movements were full, visual field were full on confrontational test. Facial sensation and strength were normal. hearing was intact to finger rubbing bilaterally. Uvula tongue midline. head turning and shoulder shrug were normal and symmetric.Tongue protrusion into cheek strength was normal. Motor: normal bulk and tone, full strength in the BUE, BLE, Sensory: normal and symmetric to light touch,  Coordination: finger-nose-finger, heel-to-shin bilaterally, no dysmetria Gait and Station:  Rising up from seated position without assistance, normal stance,  moderate stride, good arm swing, smooth turning, able to perform tiptoe, and heel walking without difficulty. Tandem gait is steady  DIAGNOSTIC DATA (LABS, IMAGING, TESTING) - I reviewed patient records, labs, notes, testing and imaging myself where available.  Lab Results  Component Value Date   WBC 9.4 06/30/2012   HGB 13.5 06/30/2012   HCT 37.9 (L) 06/30/2012   MCV 81.5 06/30/2012   PLT 158 06/30/2012      Component Value Date/Time   NA 140 06/30/2012 0330   K 3.9 06/30/2012 0330   CL 105 06/30/2012 0330   CO2 27 06/30/2012 0330   GLUCOSE 225 (H) 06/30/2012 0330   BUN 19 06/30/2012 0330   CREATININE 1.05 08/13/2016 1209   CALCIUM 9.5 06/30/2012 0330   PROT 7.7 10/11/2011 0854   ALBUMIN 4.4 10/11/2011 0854   AST 28 10/11/2011 0854   ALT 36 10/11/2011 0854   ALKPHOS 81 10/11/2011 0854   BILITOT 0.9 10/11/2011 0854   GFRNONAA >60 08/13/2016 1209   GFRAA >60 08/13/2016 1209   Lab Results  Component Value Date   CHOL 144 08/21/2015   HDL 38 (L) 08/21/2015   LDLCALC 69 08/21/2015   TRIG 184 (H) 08/21/2015   CHOLHDL 3.8 08/21/2015    ASSESSMENT AND PLAN  67 y.o. year old male  has a past medical history of obstructive sleep apnea with new CPAP machine here for initial CPAP compliance. Data dated 04/25/2017 -05/24/2017 shows compliance greater than 4 hours 100%.  Average usage 7 hours 25 minutes pressure 5-15 cm.  EPR level 3 AHI 0.1 leaks 95th percentile 7.1  PLAN: CPAP compliance 100%  continue same settings Check with equipment company about nasal pillows instead of current mask Follow-up here in 6 months Dennie Bible, [facility], [facility], [facility] [address] [place], [place] [address] [phone]

## 2017-05-23 ENCOUNTER — Encounter: Payer: Self-pay | Admitting: *Deleted

## 2017-05-24 ENCOUNTER — Encounter: Payer: Self-pay | Admitting: Nurse Practitioner

## 2017-05-26 ENCOUNTER — Encounter (INDEPENDENT_AMBULATORY_CARE_PROVIDER_SITE_OTHER): Payer: Self-pay

## 2017-05-26 ENCOUNTER — Other Ambulatory Visit: Payer: Self-pay | Admitting: Neurology

## 2017-05-26 ENCOUNTER — Encounter: Payer: Self-pay | Admitting: Nurse Practitioner

## 2017-05-26 ENCOUNTER — Ambulatory Visit: Payer: Medicare Other | Admitting: Nurse Practitioner

## 2017-05-26 DIAGNOSIS — G4733 Obstructive sleep apnea (adult) (pediatric): Secondary | ICD-10-CM

## 2017-05-26 DIAGNOSIS — Z9989 Dependence on other enabling machines and devices: Secondary | ICD-10-CM | POA: Diagnosis not present

## 2017-05-26 NOTE — Patient Instructions (Signed)
CPAP compliance 100%  continue same settings Check with equipment company about nasal pillows Follow-up here in 6 months

## 2017-05-26 NOTE — Progress Notes (Signed)
I agree with the assessment and plan as directed by NP .The patient is known to me .   Pleasant Bensinger, MD  

## 2017-06-02 ENCOUNTER — Ambulatory Visit: Payer: Medicare Other | Admitting: Internal Medicine

## 2017-06-02 ENCOUNTER — Encounter: Payer: Self-pay | Admitting: Internal Medicine

## 2017-06-02 VITALS — BP 132/70 | HR 75 | Ht 77.0 in | Wt 293.2 lb

## 2017-06-02 DIAGNOSIS — I712 Thoracic aortic aneurysm, without rupture, unspecified: Secondary | ICD-10-CM

## 2017-06-02 DIAGNOSIS — I2510 Atherosclerotic heart disease of native coronary artery without angina pectoris: Secondary | ICD-10-CM | POA: Diagnosis not present

## 2017-06-02 DIAGNOSIS — I10 Essential (primary) hypertension: Secondary | ICD-10-CM | POA: Diagnosis not present

## 2017-06-02 DIAGNOSIS — E785 Hyperlipidemia, unspecified: Secondary | ICD-10-CM

## 2017-06-02 NOTE — Progress Notes (Signed)
Follow-up Outpatient Visit Date: 06/02/2017  Primary Care Provider: Leanna Battles, MD [place] [address]  Chief Complaint: Follow-up coronary artery disease and thoracic aortic aneurysm  HPI:  James Rodgers is a 67 y.o. year-old male with history of moderate nonobstructive coronary artery disease by catheterization in 2003, thoracic aortic aneurysm, hypertension, hyperlipidemia, and diabetes mellitus, who presents for follow-up of CAD and TAA.  He was previously followed in our office by Dr. Aundra Rodgers and was last seen by James Dopp, PA, in 05/2016.  At that time, he was doing well.  No medication changes were made.  Repeat MRA of the chest in 08/2016 showed slight enlargement of the thoracic aortic aneurysm from 4.0 cm to 4.1 cm.  Today, [name] reports that he has been doing well.  He continues to notices twinges of chest pain every few months.  The pain is vague and sometimes will last up to half a day.  He has experienced this all the way back up to the time of his catheterization in 2003.  If anything, it is less frequent now.  The discomfort is nonexertional.  He plays golf regularly and does not have any exertional symptoms.  He notes that his mobility is somewhat limited due to joint pains.  Mr. Lisenbee denies shortness of breath, palpitations, orthopnea, PND, and edema.  He has experienced occasional orthostatic lightheadedness when playing golf on hot days.  His PCP has recommended holding some of his antihypertensive doses before playing golf, which has helped.  He tries to stay well-hydrated when out in the heat.  Mr. Gustafson is tolerating his medications well including dual antiplatelet therapy with aspirin and clopidogrel that was started by Dr. Doreatha Rodgers many years ago.  --------------------------------------------------------------------------------------------------  Past Medical History:  Diagnosis Date  . Anal fissure   . Arthritis   . CAD  (coronary artery disease)    a. 2003 Cath: LAD 50, D1 50-60, RCA 40;  b. 12/2008 MV: no ischemia/infarct, EF 63%. // c. MV 8/17: mild inf-sept and apical ischemia, EF 53%, Low Risk  . Diabetes type 2, controlled ([place])   . GERD (gastroesophageal reflux disease)   . History of nuclear stress test    a. Myoview 8/17: EF 53%, mild inferoseptal and mild apical ischemia, low risk  . HLD (hyperlipidemia)   . HTN (hypertension)   . LBP (low back pain)    a. h/o surgery in 1991.  . Obesity   . Osteoarthritis    knees  . Sleep apnea    a. on cpap.  . Thoracic aortic aneurysm ([place])    a. MRA 5/17 ascending thoracic aorta measuring 40 mm - recommend annual follow-up // b.  Echo 5/17: Moderate concentric LVH, EF 55-60%, normal wall motion, grade 1 diastolic dysfunction, ascending aortic diameter 45 mm, mild MR, mild LAE // c. Chest MRA 6/18: stable ascending aortic aneurysm measuring 4.1 cm (4.0 cm in 5/17). FU 1 year.   Past Surgical History:  Procedure Laterality Date  . anal fissure surgery    . back surgery    . NASAL SINUS SURGERY      Current Meds  Medication Sig  . aspirin EC 81 MG tablet Take 1 tablet (81 mg total) by mouth daily.  Marland Rodgers atorvastatin (LIPITOR) 40 MG tablet Take 40 mg by mouth daily.  . clopidogrel (PLAVIX) 75 MG tablet Take 75 mg by mouth daily.  Marland Rodgers esomeprazole (NEXIUM) 40 MG capsule Take 40 mg by mouth daily.    Marland Rodgers  FARXIGA 10 MG TABS tablet Take 10 mg by mouth daily.   . fexofenadine (ALLEGRA) 180 MG tablet Take 180 mg by mouth daily as needed (for allergies).   . metFORMIN (GLUMETZA) 500 MG (MOD) 24 hr tablet Take 500 mg by mouth 2 (two) times daily with a meal.  . metoprolol succinate (TOPROL-XL) 100 MG 24 hr tablet TAKE 1 TABLET DAILY  . minoxidil (LONITEN) 2.5 MG tablet Take 2.5 mg by mouth daily.  . mometasone (NASONEX) 50 MCG/ACT nasal spray Place 2 sprays into the nose daily as needed (for allergies).   . olmesartan (BENICAR) 20 MG tablet TAKE ONE-HALF TABLET BY   MOUTH DAILY  . tamsulosin (FLOMAX) 0.4 MG CAPS capsule Take 0.4 mg by mouth daily.    Allergies: Patient has no known allergies.  Social History   Socioeconomic History  . Marital status: Married    Spouse name: Not on file  . Number of children: Not on file  . Years of education: Not on file  . Highest education level: Not on file  Occupational History  . Not on file  Social Needs  . Financial resource strain: Not on file  . Food insecurity:    Worry: Not on file    Inability: Not on file  . Transportation needs:    Medical: Not on file    Non-medical: Not on file  Tobacco Use  . Smoking status: Never Smoker  . Smokeless tobacco: Never Used  Substance and Sexual Activity  . Alcohol use: No  . Drug use: No  . Sexual activity: Not on file  Lifestyle  . Physical activity:    Days per week: Not on file    Minutes per session: Not on file  . Stress: Not on file  Relationships  . Social connections:    Talks on phone: Not on file    Gets together: Not on file    Attends religious service: Not on file    Active member of club or organization: Not on file    Attends meetings of clubs or organizations: Not on file    Relationship status: Not on file  . Intimate partner violence:    Fear of current or ex partner: Not on file    Emotionally abused: Not on file    Physically abused: Not on file    Forced sexual activity: Not on file  Other Topics Concern  . Not on file  Social History Narrative   Married; retired Agricultural consultant; daily caffeine use.  Lives with wife in [place]. Goes to the gym about 2 times a week.    Family History  Problem Relation Age of Onset  . Heart disease Father   . Diabetes Unknown        GM    Review of Systems: A 12-system review of systems was performed and was negative except as noted in the HPI.  --------------------------------------------------------------------------------------------------  Physical Exam: BP 132/70   Pulse 75   Ht 6'  5" (1.956 m)   Wt 293 lb 3.2 oz (133 kg)   SpO2 97%   BMI 34.77 kg/m   General: NAD. HEENT: No conjunctival pallor or scleral icterus. Moist mucous membranes.  OP clear. Neck: Supple without lymphadenopathy, thyromegaly, JVD, or HJR. Lungs: Normal work of breathing. Clear to auscultation bilaterally without wheezes or crackles. Heart: Regular rate and rhythm without murmurs, rubs, or gallops. Non-displaced PMI. Abd: Bowel sounds present. Soft, NT/ND without hepatosplenomegaly Ext: No lower extremity edema. Radial, PT, and DP  pulses are 2+ bilaterally. Skin: Warm and dry without rash.  EKG:  NSR without abnormalities.  Lab Results  Component Value Date   WBC 9.4 06/30/2012   HGB 13.5 06/30/2012   HCT 37.9 (L) 06/30/2012   MCV 81.5 06/30/2012   PLT 158 06/30/2012    Lab Results  Component Value Date   NA 140 06/30/2012   K 3.9 06/30/2012   CL 105 06/30/2012   CO2 27 06/30/2012   BUN 19 06/30/2012   CREATININE 1.05 08/13/2016   GLUCOSE 225 (H) 06/30/2012   ALT 36 10/11/2011    Lab Results  Component Value Date   CHOL 144 08/21/2015   HDL 38 (L) 08/21/2015   LDLCALC 69 08/21/2015   TRIG 184 (H) 08/21/2015   CHOLHDL 3.8 08/21/2015    --------------------------------------------------------------------------------------------------  ASSESSMENT AND PLAN: Coronary artery disease without angina No anginal chest pain.  Occasional "twinges" of chest pain are nonspecific and have actually improved since Mr. Mustin catheterization in 2003.  He had moderate CAD by cath in 2003, though subsequent Myoview in 2017 was low risk.  Though he does not have a strong indication for continuation of DAPT, he has been tolerating this well and wishes to continue with aspirin and clopidogrel.  We will also continue secondary prevention with atorvastatin and metoprolol.  Thoracic aortic aneurysm Stable at 4.1 cm last year.  We will plan to repeat an MRA of the chest in June to ensure  stability.  Continue with blood pressure and lipid control.  Hypertension Blood pressure is mildly elevated today, given history of diabetes.  Given episodes of orthostatic lightheadedness, I will not make any medication changes at this time.  Of his medications, it may be worthwhile to find an alternative to minoxidil for management of his androgenic alopecia.  I will defer this to Dr. Philip Aspen.  Hyperlipidemia Mr. Westbrooks is tolerating atorvastatin 40 mg daily well.  There are no recent labs in our system.  We will request his most recent labs from Dr. Philip Aspen.  It would also be appreciated if his upcoming labs drawn this spring for annual physical would be forwarded to our office.  Follow-up: Return to clinic in 1 year.  Nelva Bush, MD 06/03/2017 3:31 PM

## 2017-06-02 NOTE — Patient Instructions (Addendum)
Medication Instructions:  Your physician recommends that you continue on your current medications as directed. Please refer to the Current Medication list given to you today.  -- If you need a refill on your cardiac medications before your next appointment, please call your pharmacy. --  Labwork: None ordered  Testing/Procedures:TO BE DONE EARLY TO MID June   Your physician has requested that you have a cardiac MRI. Cardiac MRI uses a computer to create images of your heart as its beating, producing both still and moving pictures of your heart and major blood vessels. For further information please visit [email or web address] Please follow the instruction sheet given to you today for more information.   Follow-Up: Your physician wants you to follow-up in: 1 year with Dr. Saunders Revel.    You will receive a reminder letter in the mail two months in advance. If you don't receive a letter, please call our office to schedule the follow-up appointment.  Thank you for choosing [facility]!!    Any Other Special Instructions Will Be Listed Below (If Applicable).

## 2017-06-03 ENCOUNTER — Encounter: Payer: Self-pay | Admitting: Internal Medicine

## 2017-06-06 ENCOUNTER — Telehealth: Payer: Self-pay | Admitting: Internal Medicine

## 2017-06-06 NOTE — Telephone Encounter (Signed)
Faxed request for Records to [facility] [phone] On 06/05/17  LM

## 2017-06-27 ENCOUNTER — Other Ambulatory Visit: Payer: Self-pay | Admitting: Cardiology

## 2017-06-27 DIAGNOSIS — E785 Hyperlipidemia, unspecified: Secondary | ICD-10-CM

## 2017-06-27 DIAGNOSIS — I2510 Atherosclerotic heart disease of native coronary artery without angina pectoris: Secondary | ICD-10-CM

## 2017-08-06 ENCOUNTER — Other Ambulatory Visit: Payer: Self-pay

## 2017-08-11 ENCOUNTER — Other Ambulatory Visit: Payer: Self-pay

## 2017-08-11 ENCOUNTER — Ambulatory Visit (HOSPITAL_COMMUNITY)
Admission: RE | Admit: 2017-08-11 | Discharge: 2017-08-11 | Disposition: A | Discharge status: Home / Self Care | Payer: Medicare Other | Source: Ambulatory Visit | Attending: Internal Medicine | Admitting: Internal Medicine

## 2017-08-11 ENCOUNTER — Telehealth: Payer: Self-pay

## 2017-08-11 DIAGNOSIS — I712 Thoracic aortic aneurysm, without rupture, unspecified: Secondary | ICD-10-CM

## 2017-08-11 DIAGNOSIS — E785 Hyperlipidemia, unspecified: Secondary | ICD-10-CM

## 2017-08-11 LAB — POCT I-STAT CREATININE: CREATININE: 1.1 mg/dL (ref 0.61–1.24)

## 2017-08-11 MED ORDER — GADOBENATE DIMEGLUMINE 529 MG/ML IV SOLN
20.0000 mL | Freq: Once | INTRAVENOUS | Status: AC | PRN
  Administered 2017-08-11: 20 mL via INTRAVENOUS

## 2017-08-11 MED ORDER — ATORVASTATIN CALCIUM 80 MG PO TABS: 80.0000 mg | ORAL_TABLET | Freq: Every day | ORAL | 3 refills | Status: DC

## 2017-08-11 NOTE — Telephone Encounter (Signed)
Spoke with patient's wife and gave her Dr Darnelle Bos recommendations.  She verbalized understanding.

## 2017-08-13 ENCOUNTER — Encounter (INDEPENDENT_AMBULATORY_CARE_PROVIDER_SITE_OTHER): Payer: Self-pay | Admitting: Orthopaedic Surgery

## 2017-08-13 ENCOUNTER — Ambulatory Visit (INDEPENDENT_AMBULATORY_CARE_PROVIDER_SITE_OTHER): Payer: Medicare Other

## 2017-08-13 ENCOUNTER — Ambulatory Visit (INDEPENDENT_AMBULATORY_CARE_PROVIDER_SITE_OTHER): Payer: Medicare Other | Admitting: Orthopaedic Surgery

## 2017-08-13 DIAGNOSIS — M1712 Unilateral primary osteoarthritis, left knee: Secondary | ICD-10-CM

## 2017-08-13 DIAGNOSIS — G8929 Other chronic pain: Secondary | ICD-10-CM

## 2017-08-13 DIAGNOSIS — M25562 Pain in left knee: Secondary | ICD-10-CM | POA: Diagnosis not present

## 2017-08-13 DIAGNOSIS — M1711 Unilateral primary osteoarthritis, right knee: Secondary | ICD-10-CM

## 2017-08-13 DIAGNOSIS — M25561 Pain in right knee: Secondary | ICD-10-CM | POA: Diagnosis not present

## 2017-08-13 NOTE — Progress Notes (Signed)
Office Visit Note   Patient: James Rodgers           Date of Birth: 04/07/1950           MRN: [id number] Visit Date: 08/13/2017              Requested by: Leanna Battles, MD [address] [place], [place] [address] PCP: Leanna Battles, MD   Assessment & Plan: Visit Diagnoses:  1. Chronic pain of right knee   2. Chronic pain of left knee   3. Unilateral primary osteoarthritis, left knee   4. Unilateral primary osteoarthritis, right knee     Plan: At this point given the failure of all forms of conservative treatment measures including even arthroscopic surgery on his left knee and given the fact that he has severe well-documented osteoarthritis of the left knee, we are recommending a total knee arthroplasty of the left knee.  I showed him a knee model and went over his x-rays in detail.  We talked about the risk and benefits of surgery.  I talked about the intraoperative and postoperative course as well.  All questions concerns were answered and addressed.  We will work on getting this scheduled sometime later this summer.  Follow-Up Instructions: Return for 2 weeks post-op.   Orders:  Orders Placed This Encounter  Procedures  . XR Knee 1-2 Views Right  . XR Knee 1-2 Views Left   No orders of the defined types were placed in this encounter.     Procedures: No procedures performed   Clinical Data: No additional findings.   Subjective: Chief Complaint  Patient presents with  . Right Knee - Pain  . Left Knee - Pain   Patient is a very pleasant and active 67 year old gentleman who comes in with a 2-3 history of worsening left knee pain and some right knee pain.  The left knee though locks and catches on him and gives way.  It swells quite a bit.  He has had a history of multiple cortisone injections in both knees with more being the left knee.  He also has a remote history of a left knee arthroscopy with addressing the medial meniscus of the left knee.  At this  point his pain is daily.  It is detrimentally affect is active daily living, quality of life, his mobility.  He is work on activity modification as well as anti-inflammatories.  He is worked on weight loss.  He has had again multiple injections in the left knee and an arthroscopic intervention remotely.  He is work on a home exercise program and quad strengthening as well.  He is a diabetic and reports good control of his blood glucose. HPI  Review of Systems He currently denies any headache, chest pain, shortness of breath, fever, chills, nausea, vomiting.  Objective: Vital Signs: There were no vitals taken for this visit.  Physical Exam He is alert and oriented x3 and in no acute distress. Ortho Exam Examination of his left knee show significant medial joint line tenderness.  There is mild effusion.  He has slight varus malalignment of full range of motion.  There is significant patellofemoral crepitation.  Examination of his right knee shows only patellofemoral crepitation but no instability on exam and no pain with full range of motion. Specialty Comments:  No specialty comments available.  Imaging: Xr Knee 1-2 Views Left  Result Date: 08/13/2017 2 views of the left knee show varus malalignment with complete loss of medial joint space.  There is sclerotic changes.  There are periarticular osteophytes throughout the knee.  There is significant patellofemoral arthritic changes as well.  Xr Knee 1-2 Views Right  Result Date: 08/13/2017 2 views of the right knee show no acute findings.  There is mild medial compartment narrowing and moderate patellofemoral osteoarthritis.    PMFS History: Patient Active Problem List   Diagnosis Date Noted  . Unilateral primary osteoarthritis, left knee 08/13/2017  . Chronic pain of left knee 08/13/2017  . Obstructive sleep apnea treated with continuous positive airway pressure (CPAP) 05/26/2017  . Thoracic aortic aneurysm ([place])   . Dizziness  07/25/2015  . Syncope 11/03/2012  . Hemorrhoids, complicated [id number]  . Perirectal abscess 01/07/2012  . Essential hypertension 10/02/2010  . Hyperlipidemia LDL goal <70 10/02/2010  . DM 01/31/2010  . Coronary artery disease involving native heart without angina pectoris 01/31/2010   Past Medical History:  Diagnosis Date  . Anal fissure   . Arthritis   . CAD (coronary artery disease)    a. 2003 Cath: LAD 50, D1 50-60, RCA 40;  b. 12/2008 MV: no ischemia/infarct, EF 63%. // c. MV 8/17: mild inf-sept and apical ischemia, EF 53%, Low Risk  . Diabetes type 2, controlled ([place])   . GERD (gastroesophageal reflux disease)   . History of nuclear stress test    a. Myoview 8/17: EF 53%, mild inferoseptal and mild apical ischemia, low risk  . HLD (hyperlipidemia)   . HTN (hypertension)   . LBP (low back pain)    a. h/o surgery in 1991.  . Obesity   . Osteoarthritis    knees  . Sleep apnea    a. on cpap.  . Thoracic aortic aneurysm ([place])    a. MRA 5/17 ascending thoracic aorta measuring 40 mm - recommend annual follow-up // b.  Echo 5/17: Moderate concentric LVH, EF 55-60%, normal wall motion, grade 1 diastolic dysfunction, ascending aortic diameter 45 mm, mild MR, mild LAE // c. Chest MRA 6/18: stable ascending aortic aneurysm measuring 4.1 cm (4.0 cm in 5/17). FU 1 year.    Family History  Problem Relation Age of Onset  . Heart disease Father   . Diabetes Unknown        GM    Past Surgical History:  Procedure Laterality Date  . anal fissure surgery    . back surgery    . NASAL SINUS SURGERY     Social History   Occupational History  . Not on file  Tobacco Use  . Smoking status: Never Smoker  . Smokeless tobacco: Never Used  Substance and Sexual Activity  . Alcohol use: No  . Drug use: No  . Sexual activity: Not on file

## 2017-08-26 ENCOUNTER — Other Ambulatory Visit (INDEPENDENT_AMBULATORY_CARE_PROVIDER_SITE_OTHER): Payer: Self-pay

## 2017-08-30 ENCOUNTER — Other Ambulatory Visit (INDEPENDENT_AMBULATORY_CARE_PROVIDER_SITE_OTHER): Payer: Self-pay | Admitting: Physician Assistant

## 2017-09-02 ENCOUNTER — Encounter (HOSPITAL_COMMUNITY): Payer: Self-pay

## 2017-09-02 NOTE — Patient Instructions (Addendum)
Your procedure is scheduled on: Friday, September 12, 2017   Surgery Time: 7:15AM-9:00AM   Report to [facility] Main  Entrance    Report to admitting at 5:30 AM   Call this number if you have problems the morning of surgery [phone]   Bring CPAP mask and tubing day of surgery   Do not eat food or drink liquids :After Midnight.   Do NOT smoke after Midnight   Take these medicines the morning of surgery with A SIP OF WATER: Nexium, Allegra if needed, Flonase if needed   DO NOT TAKE ANY DIABETIC MEDICATIONS DAY OF YOUR SURGERY                               You may not have any metal on your body including  jewelry, and body piercings             Do not wear lotions, powders, perfumes/cologne, or deodorant                          Men may shave face and neck.   Do not bring valuables to the hospital. [place].   Contacts, dentures or bridgework may not be worn into surgery.   Leave suitcase in the car. After surgery it may be brought to your room.   Special Instructions: Bring a copy of your healthcare power of attorney and living will documents         the day of surgery if you haven't scanned them in before.              Please read over the following fact sheets you were given:  [facility] - Preparing for Surgery Before surgery, you can play an important role.  Because skin is not sterile, your skin needs to be as free of germs as possible.  You can reduce the number of germs on your skin by washing with CHG (chlorahexidine gluconate) soap before surgery.  CHG is an antiseptic cleaner which kills germs and bonds with the skin to continue killing germs even after washing. Please DO NOT use if you have an allergy to CHG or antibacterial soaps.  If your skin becomes reddened/irritated stop using the CHG and inform your nurse when you arrive at Short Stay. Do not shave (including legs and underarms) for at least 48  hours prior to the first CHG shower.  You may shave your face/neck.  Please follow these instructions carefully:  1.  Shower with CHG Soap the night before surgery and the  morning of surgery.  2.  If you choose to wash your hair, wash your hair first as usual with your normal  shampoo.  3.  After you shampoo, rinse your hair and body thoroughly to remove the shampoo.                             4.  Use CHG as you would any other liquid soap.  You can apply chg directly to the skin and wash.  Gently with a scrungie or clean washcloth.  5.  Apply the CHG Soap to your body ONLY FROM THE NECK DOWN.   Do   not use on face/ open  Wound or open sores. Avoid contact with eyes, ears mouth and   genitals (private parts).                       Wash face,  Genitals (private parts) with your normal soap.             6.  Wash thoroughly, paying special attention to the area where your    surgery  will be performed.  7.  Thoroughly rinse your body with warm water from the neck down.  8.  DO NOT shower/wash with your normal soap after using and rinsing off the CHG Soap.                9.  Pat yourself dry with a clean towel.            10.  Wear clean pajamas.            11.  Place clean sheets on your bed the night of your first shower and do not  sleep with pets. Day of Surgery : Do not apply any lotions/deodorants the morning of surgery.  Please wear clean clothes to the hospital/surgery center.  FAILURE TO FOLLOW THESE INSTRUCTIONS MAY RESULT IN THE CANCELLATION OF YOUR SURGERY  PATIENT SIGNATURE_________________________________  NURSE SIGNATURE__________________________________  ________________________________________________________________________   James Rodgers  An incentive spirometer is a tool that can help keep your lungs clear and active. This tool measures how well you are filling your lungs with each breath. Taking long deep breaths may help reverse or  decrease the chance of developing breathing (pulmonary) problems (especially infection) following:  A long period of time when you are unable to move or be active. BEFORE THE PROCEDURE   If the spirometer includes an indicator to show your best effort, your nurse or respiratory therapist will set it to a desired goal.  If possible, sit up straight or lean slightly forward. Try not to slouch.  Hold the incentive spirometer in an upright position. INSTRUCTIONS FOR USE  1. Sit on the edge of your bed if possible, or sit up as far as you can in bed or on a chair. 2. Hold the incentive spirometer in an upright position. 3. Breathe out normally. 4. Place the mouthpiece in your mouth and seal your lips tightly around it. 5. Breathe in slowly and as deeply as possible, raising the piston or the ball toward the top of the column. 6. Hold your breath for 3-5 seconds or for as long as possible. Allow the piston or ball to fall to the bottom of the column. 7. Remove the mouthpiece from your mouth and breathe out normally. 8. Rest for a few seconds and repeat Steps 1 through 7 at least 10 times every 1-2 hours when you are awake. Take your time and take a few normal breaths between deep breaths. 9. The spirometer may include an indicator to show your best effort. Use the indicator as a goal to work toward during each repetition. 10. After each set of 10 deep breaths, practice coughing to be sure your lungs are clear. If you have an incision (the cut made at the time of surgery), support your incision when coughing by placing a pillow or rolled up towels firmly against it. Once you are able to get out of bed, walk around indoors and cough well. You may stop using the incentive spirometer when instructed by your caregiver.  RISKS AND COMPLICATIONS  Take your time  so you do not get dizzy or light-headed.  If you are in pain, you may need to take or ask for pain medication before doing incentive spirometry.  It is harder to take a deep breath if you are having pain. AFTER USE  Rest and breathe slowly and easily.  It can be helpful to keep track of a log of your progress. Your caregiver can provide you with a simple table to help with this. If you are using the spirometer at home, follow these instructions: [place] IF:   You are having difficultly using the spirometer.  You have trouble using the spirometer as often as instructed.  Your pain medication is not giving enough relief while using the spirometer.  You develop fever of 100.5 F (38.1 C) or higher. SEEK IMMEDIATE MEDICAL CARE IF:   You cough up bloody sputum that had not been present before.  You develop fever of 102 F (38.9 C) or greater.  You develop worsening pain at or near the incision site. MAKE SURE YOU:   Understand these instructions.  Will watch your condition.  Will get help right away if you are not doing well or get worse. Document Released: 07/08/2006 Document Revised: 05/20/2011 Document Reviewed: 09/08/2006 [facility] Patient Information 2014 [place], [place].  ________________________________________________________________________

## 2017-09-02 NOTE — Pre-Procedure Instructions (Signed)
The following are in epic: EKG 06/02/17 Last office note Dr. Saunders Revel (cardiologist) 06/02/17 MRI chest (f/u thoracic aortic aneurysm) 08/12/2017  The following are in the chart: Last office visit note Dr. Philip Aspen 07/31/2017 Hgb A1C (6.1) 07/24/2017

## 2017-09-03 ENCOUNTER — Other Ambulatory Visit: Payer: Self-pay

## 2017-09-03 ENCOUNTER — Encounter (HOSPITAL_COMMUNITY)
Admission: RE | Admit: 2017-09-03 | Discharge: 2017-09-03 | Disposition: A | Discharge status: Home / Self Care | Payer: Medicare Other | Source: Ambulatory Visit | Attending: Orthopaedic Surgery | Admitting: Orthopaedic Surgery

## 2017-09-03 ENCOUNTER — Encounter (HOSPITAL_COMMUNITY): Payer: Self-pay

## 2017-09-03 DIAGNOSIS — Z01812 Encounter for preprocedural laboratory examination: Secondary | ICD-10-CM | POA: Diagnosis present

## 2017-09-03 HISTORY — DX: Other seasonal allergic rhinitis: J30.2

## 2017-09-03 HISTORY — DX: Spondylosis, unspecified: M47.9

## 2017-09-03 HISTORY — DX: Personal history of other specified conditions: Z87.898

## 2017-09-03 HISTORY — DX: Calculus of ureter: N20.1

## 2017-09-03 HISTORY — DX: Unspecified hemorrhoids: K64.9

## 2017-09-03 HISTORY — DX: Other specified postprocedural states: Z98.890

## 2017-09-03 HISTORY — DX: Other intervertebral disc degeneration, lumbar region without mention of lumbar back pain or lower extremity pain: M51.369

## 2017-09-03 HISTORY — DX: Other intervertebral disc degeneration, lumbar region: M51.36

## 2017-09-03 HISTORY — DX: Unspecified background retinopathy: H35.00

## 2017-09-03 HISTORY — DX: Basal cell carcinoma of skin, unspecified: C44.91

## 2017-09-03 HISTORY — DX: Dizziness and giddiness: R42

## 2017-09-03 HISTORY — DX: Cutaneous abscess of perineum: L02.215

## 2017-09-03 HISTORY — DX: Pneumonia, unspecified organism: J18.9

## 2017-09-03 HISTORY — DX: Nausea with vomiting, unspecified: R11.2

## 2017-09-03 HISTORY — DX: Cyst of kidney, acquired: N28.1

## 2017-09-03 LAB — BASIC METABOLIC PANEL
ANION GAP: 9 (ref 5–15)
BUN: 22 mg/dL (ref 8–23)
CALCIUM: 9.6 mg/dL (ref 8.9–10.3)
CO2: 24 mmol/L (ref 22–32)
Chloride: 113 mmol/L — ABNORMAL HIGH (ref 98–111)
Creatinine, Ser: 1.02 mg/dL (ref 0.61–1.24)
GFR calc Af Amer: >60 mL/min (ref >60)
Glucose, Bld: 123 mg/dL — ABNORMAL HIGH (ref 70–99)
Potassium: 4.1 mmol/L (ref 3.5–5.1)
Sodium: 146 mmol/L — ABNORMAL HIGH (ref 135–145)

## 2017-09-03 LAB — CBC
HCT: 44.4 % (ref 39.0–52.0)
Hemoglobin: 15.1 g/dL (ref 13.0–17.0)
MCH: 29 pg (ref 26.0–34.0)
MCHC: 34 g/dL (ref 30.0–36.0)
MCV: 85.4 fL (ref 78.0–100.0)
PLATELETS: 176 10*3/uL (ref 150–400)
RBC: 5.2 MIL/uL (ref 4.22–5.81)
RDW: 13.7 % (ref 11.5–15.5)
WBC: 4.9 10*3/uL (ref 4.0–10.5)

## 2017-09-03 LAB — SURGICAL PCR SCREEN
MRSA, PCR: POSITIVE — AB
Staphylococcus aureus: POSITIVE — AB

## 2017-09-03 LAB — GLUCOSE, CAPILLARY: Glucose-Capillary: 130 mg/dL — ABNORMAL HIGH (ref 70–99)

## 2017-09-03 NOTE — Pre-Procedure Instructions (Signed)
BMP and PCR screen 09/03/2017 faxed to Dr. Ninfa Linden via epic.

## 2017-09-03 NOTE — Pre-Procedure Instructions (Signed)
Mupirocin called to [place] spoke with Sonia Baller. Mr. Schools made aware verbalized understanding.

## 2017-09-05 NOTE — Pre-Procedure Instructions (Signed)
Last office visit note 09/05/2017 Dr. Philip Aspen on chart

## 2017-09-11 MED ORDER — VANCOMYCIN HCL 10 G IV SOLR
1500.0000 mg | Freq: Once | INTRAVENOUS | Status: DC
  Filled 2017-09-11: qty 1500

## 2017-09-11 MED ORDER — TRANEXAMIC ACID 1000 MG/10ML IV SOLN
1000.0000 mg | INTRAVENOUS | Status: DC
  Filled 2017-09-11: qty 10

## 2017-09-11 MED ORDER — TRANEXAMIC ACID 1000 MG/10ML IV SOLN
1000.0000 mg | INTRAVENOUS | Status: AC
  Administered 2017-09-12: 1000 mg via INTRAVENOUS
  Filled 2017-09-11: qty 1100

## 2017-09-11 MED ORDER — VANCOMYCIN HCL 10 G IV SOLR
1500.0000 mg | Freq: Once | INTRAVENOUS | Status: AC
  Administered 2017-09-12: 1500 mg via INTRAVENOUS
  Filled 2017-09-11: qty 1500

## 2017-09-11 NOTE — Anesthesia Preprocedure Evaluation (Addendum)
Anesthesia Evaluation  Patient identified by MRN, date of birth, ID band Patient awake    Reviewed: Allergy & Precautions, NPO status , Patient's Chart, lab work & pertinent test results, reviewed documented beta blocker date and time   History of Anesthesia Complications (+) PONV  Airway Mallampati: II  TM Distance: >3 FB Neck ROM: Full    Dental   Pulmonary sleep apnea ,    breath sounds clear to auscultation       Cardiovascular hypertension, Pt. on medications and Pt. on home beta blockers + CAD and + Peripheral Vascular Disease   Rhythm:Regular Rate:Normal     Neuro/Psych negative neurological ROS     GI/Hepatic Neg liver ROS, GERD  Medicated,  Endo/Other  diabetes, Type 2, Oral Hypoglycemic Agents  Renal/GU Renal disease     Musculoskeletal   Abdominal   Peds  Hematology negative hematology ROS (+)   Anesthesia Other Findings   Reproductive/Obstetrics                            Lab Results  Component Value Date   WBC 4.9 09/03/2017   HGB 15.1 09/03/2017   HCT 44.4 09/03/2017   MCV 85.4 09/03/2017   PLT 176 09/03/2017   Lab Results  Component Value Date   CREATININE 1.02 09/03/2017   BUN 22 09/03/2017   NA 146 (H) 09/03/2017   K 4.1 09/03/2017   CL 113 (H) 09/03/2017   CO2 24 09/03/2017   No results found for: INR, PROTIME  10/2015 Myoview: Low risk stress nuclear study with mild ischemia in the basal inferoseptal wall and mild ischemia in the apical wall; EF 53 with mild global hypokinesis and mild LVE.  2017 Echo: - Left ventricle: The cavity size was normal. There was moderate  concentric hypertrophy. Systolic function was normal. The estimated ejection fraction was in the range of 55% to 60%. Wall motion was normal; there were no regional wall motion abnormalities. Doppler parameters are consistent with abnormal left ventricular relaxation (grade 1 diastolic  dysfunction). - Aortic valve: Trileaflet; mildly thickened, mildly calcified   leaflets. - Aorta: Ascending aortic diameter: 45 mm (S). - Mitral valve: There was mild regurgitation. - Left atrium: The atrium was mildly dilated. - Right ventricle: The cavity size was mildly dilated. Wall thickness was normal. 2019 MRA- Ascending aortic aneurysm 4.1cm\  Anesthesia Physical Anesthesia Plan  ASA: III  Anesthesia Plan: MAC and Spinal   Post-op Pain Management:  Regional for Post-op pain   Induction: Intravenous  PONV Risk Score and Plan: 2 and Propofol infusion, Ondansetron, Treatment may vary due to age or medical condition and Midazolam  Airway Management Planned: Natural Airway and Simple Face Mask  Additional Equipment:   Intra-op Plan:   Post-operative Plan:   Informed Consent: I have reviewed the patients History and Physical, chart, labs and discussed the procedure including the risks, benefits and alternatives for the proposed anesthesia with the patient or authorized representative who has indicated his/her understanding and acceptance.     Plan Discussed with: CRNA  Anesthesia Plan Comments:        Anesthesia Quick Evaluation

## 2017-09-12 ENCOUNTER — Other Ambulatory Visit: Payer: Self-pay

## 2017-09-12 ENCOUNTER — Ambulatory Visit (HOSPITAL_COMMUNITY): Payer: Medicare Other | Admitting: Certified Registered Nurse Anesthetist

## 2017-09-12 ENCOUNTER — Encounter (HOSPITAL_COMMUNITY)
Admission: AD | Disposition: A | Discharge status: Skilled Nursing Facility | Payer: Self-pay | Source: Home / Self Care | Attending: Orthopaedic Surgery

## 2017-09-12 ENCOUNTER — Encounter (HOSPITAL_COMMUNITY): Payer: Self-pay

## 2017-09-12 ENCOUNTER — Observation Stay (HOSPITAL_COMMUNITY): Payer: Medicare Other

## 2017-09-12 ENCOUNTER — Inpatient Hospital Stay (HOSPITAL_COMMUNITY)
Admission: AD | Admit: 2017-09-12 | Discharge: 2017-09-17 | DRG: 470 | Disposition: A | Discharge status: Skilled Nursing Facility | Payer: Medicare Other | Attending: Orthopaedic Surgery | Admitting: Orthopaedic Surgery

## 2017-09-12 DIAGNOSIS — Z85828 Personal history of other malignant neoplasm of skin: Secondary | ICD-10-CM

## 2017-09-12 DIAGNOSIS — M1712 Unilateral primary osteoarthritis, left knee: Secondary | ICD-10-CM | POA: Diagnosis not present

## 2017-09-12 DIAGNOSIS — Z6835 Body mass index (BMI) 35.0-35.9, adult: Secondary | ICD-10-CM

## 2017-09-12 DIAGNOSIS — I2510 Atherosclerotic heart disease of native coronary artery without angina pectoris: Secondary | ICD-10-CM | POA: Diagnosis present

## 2017-09-12 DIAGNOSIS — K219 Gastro-esophageal reflux disease without esophagitis: Secondary | ICD-10-CM | POA: Diagnosis present

## 2017-09-12 DIAGNOSIS — I10 Essential (primary) hypertension: Secondary | ICD-10-CM | POA: Diagnosis present

## 2017-09-12 DIAGNOSIS — E11319 Type 2 diabetes mellitus with unspecified diabetic retinopathy without macular edema: Secondary | ICD-10-CM | POA: Diagnosis present

## 2017-09-12 DIAGNOSIS — Z7984 Long term (current) use of oral hypoglycemic drugs: Secondary | ICD-10-CM

## 2017-09-12 DIAGNOSIS — E785 Hyperlipidemia, unspecified: Secondary | ICD-10-CM | POA: Diagnosis present

## 2017-09-12 DIAGNOSIS — I951 Orthostatic hypotension: Secondary | ICD-10-CM | POA: Diagnosis not present

## 2017-09-12 DIAGNOSIS — Z79899 Other long term (current) drug therapy: Secondary | ICD-10-CM

## 2017-09-12 DIAGNOSIS — G4733 Obstructive sleep apnea (adult) (pediatric): Secondary | ICD-10-CM | POA: Diagnosis present

## 2017-09-12 DIAGNOSIS — Z96652 Presence of left artificial knee joint: Secondary | ICD-10-CM

## 2017-09-12 DIAGNOSIS — E669 Obesity, unspecified: Secondary | ICD-10-CM | POA: Diagnosis present

## 2017-09-12 HISTORY — PX: TOTAL KNEE ARTHROPLASTY: SHX125

## 2017-09-12 LAB — GLUCOSE, CAPILLARY
GLUCOSE-CAPILLARY: 132 mg/dL — AB (ref 70–99)
GLUCOSE-CAPILLARY: 139 mg/dL — AB (ref 70–99)
GLUCOSE-CAPILLARY: 159 mg/dL — AB (ref 70–99)
Glucose-Capillary: 148 mg/dL — ABNORMAL HIGH (ref 70–99)
Glucose-Capillary: 110 mg/dL — ABNORMAL HIGH (ref 70–99)

## 2017-09-12 SURGERY — ARTHROPLASTY, KNEE, TOTAL
Anesthesia: Monitor Anesthesia Care | Site: Knee | Laterality: Left

## 2017-09-12 MED ORDER — ROPIVACAINE HCL 7.5 MG/ML IJ SOLN
INTRAMUSCULAR | Status: DC | PRN
  Administered 2017-09-12: 20 mL via PERINEURAL

## 2017-09-12 MED ORDER — METFORMIN HCL ER 500 MG PO TB24
500.0000 mg | ORAL_TABLET | Freq: Two times a day (BID) | ORAL | Status: DC
  Administered 2017-09-12 – 2017-09-17 (×10): 500 mg via ORAL
  Filled 2017-09-12 (×10): qty 1

## 2017-09-12 MED ORDER — ASPIRIN EC 81 MG PO TBEC
81.0000 mg | DELAYED_RELEASE_TABLET | Freq: Every day | ORAL | Status: DC
  Administered 2017-09-12 – 2017-09-16 (×5): 81 mg via ORAL
  Filled 2017-09-12 (×5): qty 1

## 2017-09-12 MED ORDER — MIDAZOLAM HCL 2 MG/2ML IJ SOLN
INTRAMUSCULAR | Status: AC
  Filled 2017-09-12: qty 2

## 2017-09-12 MED ORDER — FENTANYL CITRATE (PF) 100 MCG/2ML IJ SOLN
INTRAMUSCULAR | Status: DC | PRN
  Administered 2017-09-12: 50 ug via INTRAVENOUS

## 2017-09-12 MED ORDER — CEFAZOLIN SODIUM-DEXTROSE 2-4 GM/100ML-% IV SOLN
2.0000 g | INTRAVENOUS | Status: AC
  Administered 2017-09-12: 2 g via INTRAVENOUS
  Filled 2017-09-12: qty 100

## 2017-09-12 MED ORDER — DIPHENHYDRAMINE HCL 12.5 MG/5ML PO ELIX: 12.5000 mg | ORAL_SOLUTION | ORAL | Status: DC | PRN

## 2017-09-12 MED ORDER — METHOCARBAMOL 500 MG PO TABS
500.0000 mg | ORAL_TABLET | Freq: Four times a day (QID) | ORAL | Status: DC | PRN
  Administered 2017-09-12 – 2017-09-16 (×8): 500 mg via ORAL
  Filled 2017-09-12 (×9): qty 1

## 2017-09-12 MED ORDER — ONDANSETRON HCL 4 MG/2ML IJ SOLN
INTRAMUSCULAR | Status: AC
  Filled 2017-09-12: qty 2

## 2017-09-12 MED ORDER — PHENOL 1.4 % MT LIQD: 1.0000 | OROMUCOSAL | Status: DC | PRN

## 2017-09-12 MED ORDER — VANCOMYCIN HCL IN DEXTROSE 1-5 GM/200ML-% IV SOLN
1000.0000 mg | Freq: Two times a day (BID) | INTRAVENOUS | Status: AC
  Administered 2017-09-12: 1000 mg via INTRAVENOUS
  Filled 2017-09-12: qty 200

## 2017-09-12 MED ORDER — PANTOPRAZOLE SODIUM 40 MG PO TBEC
40.0000 mg | DELAYED_RELEASE_TABLET | Freq: Every day | ORAL | Status: DC
  Administered 2017-09-12 – 2017-09-17 (×6): 40 mg via ORAL
  Filled 2017-09-12 (×6): qty 1

## 2017-09-12 MED ORDER — METOPROLOL SUCCINATE ER 50 MG PO TB24
100.0000 mg | ORAL_TABLET | Freq: Every day | ORAL | Status: DC
  Administered 2017-09-12 – 2017-09-14 (×3): 100 mg via ORAL
  Filled 2017-09-12 (×4): qty 2

## 2017-09-12 MED ORDER — PROPOFOL 10 MG/ML IV BOLUS
INTRAVENOUS | Status: AC
  Filled 2017-09-12: qty 60

## 2017-09-12 MED ORDER — STERILE WATER FOR IRRIGATION IR SOLN
Status: DC | PRN
  Administered 2017-09-12: 2000 mL

## 2017-09-12 MED ORDER — MENTHOL 3 MG MT LOZG: 1.0000 | LOZENGE | OROMUCOSAL | Status: DC | PRN

## 2017-09-12 MED ORDER — CHLORHEXIDINE GLUCONATE CLOTH 2 % EX PADS: 6.0000 | MEDICATED_PAD | Freq: Every day | CUTANEOUS | Status: DC

## 2017-09-12 MED ORDER — CLOPIDOGREL BISULFATE 75 MG PO TABS
75.0000 mg | ORAL_TABLET | Freq: Every day | ORAL | Status: DC
  Administered 2017-09-13 – 2017-09-17 (×5): 75 mg via ORAL
  Filled 2017-09-12 (×5): qty 1

## 2017-09-12 MED ORDER — PROPOFOL 10 MG/ML IV BOLUS
INTRAVENOUS | Status: DC | PRN
  Administered 2017-09-12 (×2): 20 mg via INTRAVENOUS
  Administered 2017-09-12: 10 mg via INTRAVENOUS

## 2017-09-12 MED ORDER — MINOXIDIL 2.5 MG PO TABS
5.0000 mg | ORAL_TABLET | Freq: Every day | ORAL | Status: DC
  Administered 2017-09-12 – 2017-09-16 (×4): 5 mg via ORAL
  Filled 2017-09-12 (×5): qty 2

## 2017-09-12 MED ORDER — FENTANYL CITRATE (PF) 100 MCG/2ML IJ SOLN
INTRAMUSCULAR | Status: AC
  Filled 2017-09-12: qty 2

## 2017-09-12 MED ORDER — HYDROMORPHONE HCL 1 MG/ML IJ SOLN: 0.2500 mg | INTRAMUSCULAR | Status: DC | PRN

## 2017-09-12 MED ORDER — SODIUM CHLORIDE 0.9 % IV SOLN
INTRAVENOUS | Status: DC
  Administered 2017-09-12 – 2017-09-13 (×3): via INTRAVENOUS

## 2017-09-12 MED ORDER — CANAGLIFLOZIN 100 MG PO TABS
100.0000 mg | ORAL_TABLET | Freq: Every day | ORAL | Status: DC
  Administered 2017-09-13 – 2017-09-17 (×5): 100 mg via ORAL
  Filled 2017-09-12 (×5): qty 1

## 2017-09-12 MED ORDER — ALUM & MAG HYDROXIDE-SIMETH 200-200-20 MG/5ML PO SUSP: 30.0000 mL | ORAL | Status: DC | PRN

## 2017-09-12 MED ORDER — PROPOFOL 10 MG/ML IV BOLUS
INTRAVENOUS | Status: AC
  Filled 2017-09-12: qty 20

## 2017-09-12 MED ORDER — CLONIDINE HCL (ANALGESIA) 100 MCG/ML EP SOLN
EPIDURAL | Status: DC | PRN
  Administered 2017-09-12: 50 ug

## 2017-09-12 MED ORDER — 0.9 % SODIUM CHLORIDE (POUR BTL) OPTIME
TOPICAL | Status: DC | PRN
  Administered 2017-09-12: 1000 mL

## 2017-09-12 MED ORDER — TAMSULOSIN HCL 0.4 MG PO CAPS
0.4000 mg | ORAL_CAPSULE | Freq: Every day | ORAL | Status: DC
  Administered 2017-09-12 – 2017-09-14 (×3): 0.4 mg via ORAL
  Filled 2017-09-12 (×3): qty 1

## 2017-09-12 MED ORDER — BUPIVACAINE IN DEXTROSE 0.75-8.25 % IT SOLN
INTRATHECAL | Status: DC | PRN
  Administered 2017-09-12: 2 mL via INTRATHECAL

## 2017-09-12 MED ORDER — ONDANSETRON HCL 4 MG/2ML IJ SOLN
4.0000 mg | Freq: Four times a day (QID) | INTRAMUSCULAR | Status: DC | PRN
  Administered 2017-09-12 – 2017-09-13 (×2): 4 mg via INTRAVENOUS
  Filled 2017-09-12 (×2): qty 2

## 2017-09-12 MED ORDER — DEXAMETHASONE SODIUM PHOSPHATE 10 MG/ML IJ SOLN
INTRAMUSCULAR | Status: AC
  Filled 2017-09-12: qty 1

## 2017-09-12 MED ORDER — ONDANSETRON HCL 4 MG/2ML IJ SOLN
INTRAMUSCULAR | Status: DC | PRN
  Administered 2017-09-12: 4 mg via INTRAVENOUS

## 2017-09-12 MED ORDER — DOCUSATE SODIUM 100 MG PO CAPS
100.0000 mg | ORAL_CAPSULE | Freq: Two times a day (BID) | ORAL | Status: DC
  Administered 2017-09-12 – 2017-09-17 (×11): 100 mg via ORAL
  Filled 2017-09-12 (×11): qty 1

## 2017-09-12 MED ORDER — OXYCODONE HCL 5 MG PO TABS
5.0000 mg | ORAL_TABLET | ORAL | Status: DC | PRN
  Administered 2017-09-12 (×2): 5 mg via ORAL
  Filled 2017-09-12 (×2): qty 1

## 2017-09-12 MED ORDER — ONDANSETRON HCL 4 MG PO TABS
4.0000 mg | ORAL_TABLET | Freq: Four times a day (QID) | ORAL | Status: DC | PRN
  Administered 2017-09-13: 4 mg via ORAL
  Filled 2017-09-12: qty 1

## 2017-09-12 MED ORDER — METHOCARBAMOL 1000 MG/10ML IJ SOLN
500.0000 mg | Freq: Four times a day (QID) | INTRAVENOUS | Status: DC | PRN
  Filled 2017-09-12: qty 5

## 2017-09-12 MED ORDER — CHLORHEXIDINE GLUCONATE 4 % EX LIQD: 60.0000 mL | Freq: Once | CUTANEOUS | Status: DC

## 2017-09-12 MED ORDER — PROPOFOL 500 MG/50ML IV EMUL
INTRAVENOUS | Status: DC | PRN
  Administered 2017-09-12: 75 ug/kg/min via INTRAVENOUS

## 2017-09-12 MED ORDER — ATORVASTATIN CALCIUM 40 MG PO TABS
80.0000 mg | ORAL_TABLET | Freq: Every day | ORAL | Status: DC
  Administered 2017-09-12 – 2017-09-16 (×5): 80 mg via ORAL
  Filled 2017-09-12 (×5): qty 2

## 2017-09-12 MED ORDER — SODIUM CHLORIDE 0.9 % IR SOLN
Status: DC | PRN
  Administered 2017-09-12: 3000 mL

## 2017-09-12 MED ORDER — POLYETHYLENE GLYCOL 3350 17 G PO PACK
17.0000 g | PACK | Freq: Every day | ORAL | Status: DC | PRN
  Administered 2017-09-15: 17 g via ORAL
  Filled 2017-09-12: qty 1

## 2017-09-12 MED ORDER — HYDROMORPHONE HCL 1 MG/ML IJ SOLN
0.5000 mg | INTRAMUSCULAR | Status: DC | PRN
  Administered 2017-09-12 – 2017-09-13 (×3): 0.5 mg via INTRAVENOUS
  Filled 2017-09-12 (×2): qty 1

## 2017-09-12 MED ORDER — LACTATED RINGERS IV SOLN
INTRAVENOUS | Status: DC
  Administered 2017-09-12 (×2): via INTRAVENOUS

## 2017-09-12 MED ORDER — MUPIROCIN 2 % EX OINT
1.0000 "application " | TOPICAL_OINTMENT | Freq: Two times a day (BID) | CUTANEOUS | Status: DC
  Filled 2017-09-12: qty 22

## 2017-09-12 MED ORDER — IRBESARTAN 150 MG PO TABS
150.0000 mg | ORAL_TABLET | Freq: Every day | ORAL | Status: DC
  Administered 2017-09-12 – 2017-09-13 (×2): 150 mg via ORAL
  Filled 2017-09-12 (×2): qty 1

## 2017-09-12 MED ORDER — ONDANSETRON HCL 4 MG/2ML IJ SOLN: 4.0000 mg | Freq: Once | INTRAMUSCULAR | Status: DC | PRN

## 2017-09-12 MED ORDER — MIDAZOLAM HCL 5 MG/5ML IJ SOLN
INTRAMUSCULAR | Status: DC | PRN
  Administered 2017-09-12 (×2): 1 mg via INTRAVENOUS

## 2017-09-12 MED ORDER — PHENYLEPHRINE 40 MCG/ML (10ML) SYRINGE FOR IV PUSH (FOR BLOOD PRESSURE SUPPORT)
PREFILLED_SYRINGE | INTRAVENOUS | Status: DC | PRN
  Administered 2017-09-12: 160 ug via INTRAVENOUS
  Administered 2017-09-12: 80 ug via INTRAVENOUS
  Administered 2017-09-12: 160 ug via INTRAVENOUS

## 2017-09-12 MED ORDER — ACETAMINOPHEN 325 MG PO TABS
325.0000 mg | ORAL_TABLET | Freq: Four times a day (QID) | ORAL | Status: DC | PRN
  Administered 2017-09-13 – 2017-09-17 (×4): 650 mg via ORAL
  Filled 2017-09-12 (×4): qty 2

## 2017-09-12 MED ORDER — OXYCODONE HCL 5 MG PO TABS
10.0000 mg | ORAL_TABLET | ORAL | Status: DC | PRN
  Administered 2017-09-12 – 2017-09-13 (×4): 15 mg via ORAL
  Filled 2017-09-12 (×4): qty 3

## 2017-09-12 MED ORDER — METOCLOPRAMIDE HCL 5 MG PO TABS: 5.0000 mg | ORAL_TABLET | Freq: Three times a day (TID) | ORAL | Status: DC | PRN

## 2017-09-12 MED ORDER — METOCLOPRAMIDE HCL 5 MG/ML IJ SOLN: 5.0000 mg | Freq: Three times a day (TID) | INTRAMUSCULAR | Status: DC | PRN

## 2017-09-12 SURGICAL SUPPLY — 53 items
BAG ZIPLOCK 12X15 (MISCELLANEOUS) IMPLANT
BANDAGE ACE 6X5 VEL STRL LF (GAUZE/BANDAGES/DRESSINGS) ×6 IMPLANT
BEARING TRIATHLON TIBIAL 6X11 (Orthopedic Implant) ×3 IMPLANT
BENZOIN TINCTURE PRP APPL 2/3 (GAUZE/BANDAGES/DRESSINGS) ×3 IMPLANT
BLADE SAG 18X100X1.27 (BLADE) ×3 IMPLANT
BOWL SMART MIX CTS (DISPOSABLE) IMPLANT
BSPLAT TIB 6 KN TRITANIUM (Knees) ×1 IMPLANT
CLOSURE STERI-STRIP 1/4X4 (GAUZE/BANDAGES/DRESSINGS) IMPLANT
CLOSURE WOUND 1/2 X4 (GAUZE/BANDAGES/DRESSINGS) ×1
COVER SURGICAL LIGHT HANDLE (MISCELLANEOUS) ×3 IMPLANT
CUFF TOURN SGL QUICK 34 (TOURNIQUET CUFF) ×2
CUFF TRNQT CYL 34X4X40X1 (TOURNIQUET CUFF) ×1 IMPLANT
DECANTER SPIKE VIAL GLASS SM (MISCELLANEOUS) IMPLANT
DRAPE U-SHAPE 47X51 STRL (DRAPES) ×3 IMPLANT
DURAPREP 26ML APPLICATOR (WOUND CARE) ×3 IMPLANT
ELECT REM PT RETURN 15FT ADLT (MISCELLANEOUS) ×3 IMPLANT
GAUZE SPONGE 4X4 12PLY STRL (GAUZE/BANDAGES/DRESSINGS) ×3 IMPLANT
GAUZE XEROFORM 1X8 LF (GAUZE/BANDAGES/DRESSINGS) IMPLANT
GLOVE BIO SURGEON STRL SZ7.5 (GLOVE) ×3 IMPLANT
GLOVE BIOGEL PI IND STRL 8 (GLOVE) ×2 IMPLANT
GLOVE BIOGEL PI INDICATOR 8 (GLOVE) ×4
GLOVE ECLIPSE 8.0 STRL XLNG CF (GLOVE) ×3 IMPLANT
GOWN STRL REUS W/TWL XL LVL3 (GOWN DISPOSABLE) ×6 IMPLANT
HANDPIECE INTERPULSE COAX TIP (DISPOSABLE) ×2
HOLDER FOLEY CATH W/STRAP (MISCELLANEOUS) ×3 IMPLANT
IMMOBILIZER KNEE 20 (SOFTGOODS) ×3
IMMOBILIZER KNEE 20 THIGH 36 (SOFTGOODS) ×1 IMPLANT
KNEE TIBIAL COMPONENT SZ6 (Knees) ×3 IMPLANT
NDL SAFETY ECLIPSE 18X1.5 (NEEDLE) IMPLANT
NEEDLE HYPO 18GX1.5 SHARP (NEEDLE)
NS IRRIG 1000ML POUR BTL (IV SOLUTION) ×3 IMPLANT
PACK TOTAL KNEE CUSTOM (KITS) ×3 IMPLANT
PAD ABD 8X10 STRL (GAUZE/BANDAGES/DRESSINGS) ×3 IMPLANT
PADDING CAST COTTON 6X4 STRL (CAST SUPPLIES) ×6 IMPLANT
PATELLA ASYMMETRIC 38X11 KNEE (Orthopedic Implant) ×3 IMPLANT
POSITIONER SURGICAL ARM (MISCELLANEOUS) ×3 IMPLANT
SET HNDPC FAN SPRY TIP SCT (DISPOSABLE) ×1 IMPLANT
SET PAD KNEE POSITIONER (MISCELLANEOUS) ×3 IMPLANT
STAPLER VISISTAT 35W (STAPLE) IMPLANT
STRIP CLOSURE SKIN 1/2X4 (GAUZE/BANDAGES/DRESSINGS) ×2 IMPLANT
SUT MNCRL AB 4-0 PS2 18 (SUTURE) IMPLANT
SUT VIC AB 0 CT1 27 (SUTURE) ×2
SUT VIC AB 0 CT1 27XBRD ANTBC (SUTURE) ×1 IMPLANT
SUT VIC AB 1 CT1 27 (SUTURE) ×6
SUT VIC AB 1 CT1 27XBRD ANTBC (SUTURE) ×2 IMPLANT
SUT VIC AB 2-0 CT1 27 (SUTURE) ×4
SUT VIC AB 2-0 CT1 TAPERPNT 27 (SUTURE) ×2 IMPLANT
SYR 3ML LL SCALE MARK (SYRINGE) IMPLANT
TRAY FOLEY MTR SLVR 16FR STAT (SET/KITS/TRAYS/PACK) ×3 IMPLANT
Triathlon Posterior Stabilized Femoral, #6, LFT, ×3 IMPLANT
WATER STERILE IRR 1000ML POUR (IV SOLUTION) ×3 IMPLANT
WRAP KNEE MAXI GEL POST OP (GAUZE/BANDAGES/DRESSINGS) ×3 IMPLANT
YANKAUER SUCT BULB TIP 10FT TU (MISCELLANEOUS) ×3 IMPLANT

## 2017-09-12 NOTE — Anesthesia Postprocedure Evaluation (Signed)
Anesthesia Post Note  Patient: RUGER SAXER  Procedure(s) Performed: LEFT TOTAL KNEE ARTHROPLASTY (Left Knee)     Patient location during evaluation: PACU Anesthesia Type: Spinal Level of consciousness: awake and alert Pain management: pain level controlled Vital Signs Assessment: post-procedure vital signs reviewed and stable Respiratory status: spontaneous breathing and respiratory function stable Cardiovascular status: blood pressure returned to baseline and stable Postop Assessment: spinal receding Anesthetic complications: no    Last Vitals:  Vitals:   09/12/17 1030 09/12/17 1045  BP:  131/85  Pulse:  (!) 58  Resp:  16  Temp: (!) 36.4 C 36.4 C  SpO2:  100%    Last Pain:  Vitals:   09/12/17 1045  TempSrc: Oral  PainSc:                  Tiajuana Amass

## 2017-09-12 NOTE — Evaluation (Signed)
Physical Therapy Evaluation Patient Details Name: James Rodgers MRN: [id number] DOB: 1950/09/04 Today's Date: 09/12/2017   History of Present Illness  Pt s/p L TKR and with hx of CAD, DDD, and back surgery  Clinical Impression  Pt s/p L TKR and presents with decreased L LE strength/ROM and post op pain limiting functional mobility.  Pt should progress to dc home with family assist.    Follow Up Recommendations Home health PT;Follow surgeon's recommendation for DC plan and follow-up therapies    Equipment Recommendations  None recommended by PT    Recommendations for Other Services       Precautions / Restrictions Precautions Precautions: Knee;Fall Required Braces or Orthoses: Knee Immobilizer - Left Knee Immobilizer - Left: Discontinue once straight leg raise with < 10 degree lag Restrictions Weight Bearing Restrictions: No Other Position/Activity Restrictions: WBAT      Mobility  Bed Mobility Overal bed mobility: Needs Assistance Bed Mobility: Supine to Sit     Supine to sit: Min assist;+2 for physical assistance;+2 for safety/equipment     General bed mobility comments: cues for sequence and use of R LE to self assist  Transfers Overall transfer level: Needs assistance   Transfers: Sit to/from Stand Sit to Stand: Min assist;+2 physical assistance;+2 safety/equipment         General transfer comment: cues for LE management and use of UEs to self assist  Ambulation/Gait Ambulation/Gait assistance: Min assist;+2 physical assistance;+2 safety/equipment Gait Distance (Feet): 12 Feet Assistive device: Rolling walker (2 wheeled) Gait Pattern/deviations: Step-to pattern;Decreased step length - right;Decreased step length - left;Shuffle;Trunk flexed Gait velocity: decr   General Gait Details: cues for sequence, posture and position from RW - distance ltd by increasing dizziness  Stairs            Wheelchair Mobility    Modified Rankin (Stroke  Patients Only)       Balance                                             Pertinent Vitals/Pain Pain Assessment: 0-10 Pain Score: 5  Pain Location: L knee Pain Descriptors / Indicators: Aching;Sore Pain Intervention(s): Limited activity within patient's tolerance;Monitored during session;Premedicated before session;Ice applied    Home Living Family/patient expects to be discharged to:: Private residence Living Arrangements: Spouse/significant other Available Help at Discharge: Family Type of Home: House Home Access: Stairs to enter Entrance Stairs-Rails: Psychiatric nurse of Steps: 3 Home Layout: One level Home Equipment: Environmental consultant - 2 wheels;Crutches      Prior Function Level of Independence: Independent               Hand Dominance        Extremity/Trunk Assessment   Upper Extremity Assessment Upper Extremity Assessment: Overall WFL for tasks assessed    Lower Extremity Assessment Lower Extremity Assessment: LLE deficits/detail    Cervical / Trunk Assessment Cervical / Trunk Assessment: Normal  Communication   Communication: No difficulties  Cognition Arousal/Alertness: Awake/alert Behavior During Therapy: WFL for tasks assessed/performed Overall Cognitive Status: Within Functional Limits for tasks assessed                                        General Comments      Exercises Total Joint Exercises Ankle Circles/Pumps:  AROM;Both;15 reps;Supine   Assessment/Plan    PT Assessment Patient needs continued PT services  PT Problem List Decreased strength;Decreased range of motion;Decreased activity tolerance;Decreased mobility;Decreased knowledge of use of DME;Pain       PT Treatment Interventions DME instruction;Gait training;Stair training;Functional mobility training;Therapeutic activities;Therapeutic exercise;Patient/family education    PT Goals (Current goals can be found in the Care Plan  section)  Acute Rehab PT Goals Patient Stated Goal: Regain IND PT Goal Formulation: With patient Time For Goal Achievement: 09/19/17 Potential to Achieve Goals: Good    Frequency 7X/week   Barriers to discharge        Co-evaluation               AM-PAC PT "6 Clicks" Daily Activity  Outcome Measure Difficulty turning over in bed (including adjusting bedclothes, sheets and blankets)?: Unable Difficulty moving from lying on back to sitting on the side of the bed? : Unable Difficulty sitting down on and standing up from a chair with arms (e.g., wheelchair, bedside commode, etc,.)?: Unable Help needed moving to and from a bed to chair (including a wheelchair)?: A Little Help needed walking in hospital room?: A Little Help needed climbing 3-5 steps with a railing? : A Lot 6 Click Score: 11    End of Session Equipment Utilized During Treatment: Gait belt;Left knee immobilizer Activity Tolerance: Patient tolerated treatment well Patient left: with call bell/phone within reach;in bed;with family/visitor present Nurse Communication: Mobility status PT Visit Diagnosis: Difficulty in walking, not elsewhere classified (R26.2)    Time: 8592-9244 PT Time Calculation (min) (ACUTE ONLY): 38 min   Charges:   PT Evaluation $PT Eval Low Complexity: 1 Low PT Treatments $Gait Training: 8-22 mins   PT G Codes:        Pg [phone]   Alaura Schippers 09/12/2017, 5:08 PM

## 2017-09-12 NOTE — Plan of Care (Signed)
  Problem: Health Behavior/Discharge Planning: Goal: Ability to manage health-related needs will improve Outcome: Progressing   Problem: Activity: Goal: Risk for activity intolerance will decrease Outcome: Progressing   Problem: Nutrition: Goal: Adequate nutrition will be maintained Outcome: Progressing   

## 2017-09-12 NOTE — Brief Op Note (Signed)
09/12/2017  8:35 AM  PATIENT:  James Rodgers  67 y.o. male  PRE-OPERATIVE DIAGNOSIS:  osteoarthritis left knee  POST-OPERATIVE DIAGNOSIS:  osteoarthritis left knee  PROCEDURE:  Procedure(s): LEFT TOTAL KNEE ARTHROPLASTY (Left)  SURGEON:  Surgeon(s) and Role:    Mcarthur Rossetti, MD - Primary  PHYSICIAN ASSISTANT: Benita Stabile, PA-C  ANESTHESIA:   regional and spinal  COUNTS:  YES  TOURNIQUET:   Total Tourniquet Time Documented: Thigh (Left) - 39 minutes Total: Thigh (Left) - 39 minutes   DICTATION: .Other Dictation: Dictation Number [id number]  PLAN OF CARE: Admit to inpatient   PATIENT DISPOSITION:  PACU - hemodynamically stable.   Delay start of Pharmacological VTE agent (>24hrs) due to surgical blood loss or risk of bleeding: no

## 2017-09-12 NOTE — Anesthesia Procedure Notes (Signed)
Anesthesia Regional Block: Adductor canal block   Pre-Anesthetic Checklist: ,, timeout performed, Correct Patient, Correct Site, Correct Laterality, Correct Procedure, Correct Position, site marked, Risks and benefits discussed,  Surgical consent,  Pre-op evaluation,  At surgeon's request and post-op pain management  Laterality: Left  Prep: chloraprep       Needles:  Injection technique: Single-shot  Needle Type: Echogenic Needle     Needle Length: 9cm  Needle Gauge: 21     Additional Needles:   Procedures:,,,, ultrasound used (permanent image in chart),,,,  Narrative:  Start time: 09/12/2017 6:53 AM End time: 09/12/2017 7:00 AM Injection made incrementally with aspirations every 5 mL.  Performed by: Personally  Anesthesiologist: Suzette Battiest, MD

## 2017-09-12 NOTE — Op Note (Signed)
NAME: James Rodgers, James Rodgers MEDICAL RECORD [id number] ACCOUNT [id number] DATE OF BIRTH:1950-06-28 FACILITY: [facility] LOCATION: [facility] PHYSICIAN:Mahonri Seiden Kerry Fort, MD  OPERATIVE REPORT  DATE OF PROCEDURE:  09/12/2017  PREOPERATIVE DIAGNOSIS:  Primary osteoarthritis and degenerative joint disease, left knee.  POSTOPERATIVE DIAGNOSIS:  Primary osteoarthritis and degenerative joint disease, left knee.  PROCEDURE:  Left total knee arthroplasty.  IMPLANTS:  Stryker press-fit knee system with size 6 press-fit femur, size 6 press-fit tibial tray, 11 millimeter fixed bearing polyethylene insert, size 38 press-fit patellar button.  SURGEON:  Lind Guest. Ninfa Linden, MD  ASSISTANT:  Erskine Emery, PA-C.  ANESTHESIA: 1.  Left lower extremity adductor canal block. 2.  Spinal.  ANTIBIOTICS:  1.5 grams IV vancomycin.  TOURNIQUET TIME:  38 minutes.  ESTIMATED BLOOD LOSS:  100 mL.  COMPLICATIONS:  None.  INDICATIONS:  The patient is a 67 year old gentleman with good bone quality, but unfortunately severe osteoarthritis and degenerative joint disease of his left knee.  He has a remote knee arthroscopy performed to that knee years ago.  He had a large  medial meniscal tear at that time.  Over time he has lost contact stressors of the knee and now the knee has varus malalignment throughout the knee, especially with the medial side being bone-on-bone wear.  There are sclerotic changes as well and varus  malalignment.  At this point, he has tried and failed all forms of conservative treatment and does wish to proceed with a total knee arthroplasty.  He understands fully the risk of acute blood loss anemia, nerve or vessel injury, fracture, infection and  DVT.  He understands our goals are to decrease pain, improve mobility and overall improve quality of life.  DESCRIPTION OF PROCEDURE:  After informed consent was obtained and appropriate left knee was marked.  An adductor canal block was  obtained in the holding room.  He was then brought to the operating room and sat up on the operating table.  Spinal  anesthesia was obtained, he was then laid in the supine position.  A Foley catheter was placed and a nonsterile tourniquet was placed around the upper left thigh.  His left thigh, knee, leg and ankle were prepped and draped with DuraPrep and sterile  drapes including a sterile stockinette.  Time-out was called and he was identified correct patient and correct left knee.  We then used an Esmarch to wrap that leg and tourniquet was inflated to 300 mm of pressure.  We then made a direct midline incision  over the knee and carried this proximally and distally.  We dissected down the knee joint and carried out a medial parapatellar arthrotomy finding a very large joint effusion and significant osteoarthritis throughout the knee.  We removed periarticular  osteophytes from the knee and then with the knee in a flexed position, we removed remnants of ACL, PCL, medial and lateral meniscus.  We then used an extramedullary alignment guide for making our tibia cut.  We set this off the tibial tubercle and  corrected for varus valgus in a neutral slope.  We also set this to taking 9 mm off the high side.  We made our tibial cut without difficulty.  We then went to the femur and used an intramedullary guide for alignment of the distal femoral cut using a  drill hole through the notch of the femur.  We set this for a 10 mm distal femoral cut setting it for left knee at 5 degrees externally rotated.  We made this cut without  difficulty.  We then went back down to full extension with a 9 mm extension block  and achieved full extension.  We then went back to the femur and put our femoral sizing guide based off the epicondylar axis sizing for 3 degrees left.  We chose a size 6 femur.  We then put our 4-in-1 cutting block for a size 6 femur, made our anterior  and posterior cuts followed by our chamfer cuts.  We  then made our femoral box cut.  We then went back to the tibia and chose a size 6 tibial tray for coverage and we made our rotation off of the tibial tubercle and our femur.  We made our keel punch off  of this and with a size 6 trial tibia and 6 trial femur.  We tried a 9 mm and 11 millimeter fixed bearing polyethylene insert and we were pleased with the 11 mm.  We then made our patellar cut and drilled 3 holes for a size 38 patellar button.  We felt  good with all of this being press-fit based on his bone quality.  We then irrigated the knee with normal saline using pulsatile lavage.  We then placed our real press-fit Stryker Triathlon tibia size 6 followed by our size 6 left femur.  We then placed  our 11 millimeter fixed bearing polyethylene insert and cemented and then press-fit our patellar button.  We then let the tourniquet down.  Hemostasis obtained with electrocautery.  We then closed the arthrotomy with interrupted #1 Vicryl suture followed  by 0 Vicryl in the deep tissue, 2-0 Vicryl subcutaneous tissue, 4-0 Monocryl subcuticular stitch and Steri-Strips on the skin.  A well-padded sterile dressing was applied and he was taken to recovery room in stable condition.  All final counts were  correct.  There were no complications noted.  Of note, Benita Stabile, PA-C, assisted in the entire case.  His assistance was crucial for facilitating all aspects of this case.  TN/NUANCE  D:09/12/2017 T:09/12/2017 JOB:[id number]

## 2017-09-12 NOTE — Anesthesia Procedure Notes (Signed)
Spinal  Patient location during procedure: OR Start time: 09/12/2017 7:22 AM End time: 09/12/2017 7:25 AM Staffing Anesthesiologist: Suzette Battiest, MD Resident/CRNA: Genelle Bal, CRNA Performed: resident/CRNA  Preanesthetic Checklist Completed: patient identified, site marked, surgical consent, pre-op evaluation, timeout performed, IV checked, risks and benefits discussed and monitors and equipment checked Spinal Block Patient position: sitting Prep: DuraPrep Patient monitoring: heart rate, cardiac monitor, continuous pulse ox and blood pressure Approach: midline Location: L3-4 Injection technique: single-shot Needle Needle type: Sprotte  Needle gauge: 24 G Needle length: 10 cm Assessment Sensory level: T6 Additional Notes Spinal attempt x1 by S. Alford Highland, CRNA with pencan sprotte 24g 4in, + clear, free-flowing CSF, easy to inject, - paresthesia, level to T6, NAC.

## 2017-09-12 NOTE — H&P (Signed)
TOTAL KNEE ADMISSION H&P  Patient is being admitted for left total knee arthroplasty.  Subjective:  Chief Complaint:left knee pain.  HPI: James Rodgers, 67 y.o. male, has a history of pain and functional disability in the left knee due to arthritis and has failed non-surgical conservative treatments for greater than 12 weeks to includeNSAID's and/or analgesics, corticosteriod injections, viscosupplementation injections, flexibility and strengthening excercises, weight reduction as appropriate and activity modification.  Onset of symptoms was gradual, starting 5 years ago with gradually worsening course since that time. The patient noted prior procedures on the knee to include  arthroscopy on the left knee(s).  Patient currently rates pain in the left knee(s) at 10 out of 10 with activity. Patient has night pain, worsening of pain with activity and weight bearing, pain that interferes with activities of daily living, pain with passive range of motion, crepitus and joint swelling.  Patient has evidence of subchondral sclerosis, periarticular osteophytes and joint space narrowing by imaging studies. There is no active infection.  Patient Active Problem List   Diagnosis Date Noted  . Unilateral primary osteoarthritis, left knee 08/13/2017  . Chronic pain of left knee 08/13/2017  . Obstructive sleep apnea treated with continuous positive airway pressure (CPAP) 05/26/2017  . Thoracic aortic aneurysm ([place])   . Dizziness 07/25/2015  . Syncope 11/03/2012  . Hemorrhoids, complicated 50/93/2671  . Perirectal abscess 01/07/2012  . Essential hypertension 10/02/2010  . Hyperlipidemia LDL goal <70 10/02/2010  . DM 01/31/2010  . Coronary artery disease involving native heart without angina pectoris 01/31/2010   Past Medical History:  Diagnosis Date  . Anal fissure   . Arthritis   . Basal cell carcinoma    neck  . CAD (coronary artery disease)    a. 2003 Cath: LAD 50, D1 50-60, RCA 40;  b.  12/2008 MV: no ischemia/infarct, EF 63%. // c. MV 8/17: mild inf-sept and apical ischemia, EF 53%, Low Risk  . DDD (degenerative disc disease), lumbar   . Diabetes type 2, controlled ([place])   . GERD (gastroesophageal reflux disease)   . Hemorrhoids   . History of dizziness   . History of nuclear stress test    a. Myoview 8/17: EF 53%, mild inferoseptal and mild apical ischemia, low risk  . HLD (hyperlipidemia)   . HTN (hypertension)   . LBP (low back pain)    a. h/o surgery in 1991.  . Obesity   . Orthostatic lightheadedness    with heat exposure  . Osteoarthritis    knees  . Perineal abscess 09/2012  . Pneumonia 09/2005  . PONV (postoperative nausea and vomiting)   . Renal cyst, right   . Retinopathy    Mild, left eye  . Right ureteral stone   . Seasonal allergic rhinitis   . Sleep apnea    a. on cpap.  [name] Spondylosis    lumbar  . Thoracic aortic aneurysm ([place])    a. MRA 5/17 ascending thoracic aorta measuring 40 mm - recommend annual follow-up // b.  Echo 5/17: Moderate concentric LVH, EF 55-60%, normal wall motion, grade 1 diastolic dysfunction, ascending aortic diameter 45 mm, mild MR, mild LAE // c. Chest MRA 6/18: stable ascending aortic aneurysm measuring 4.1 cm (4.0 cm in 5/17). FU 1 year.    Past Surgical History:  Procedure Laterality Date  . anal fissure surgery  1981  . CARDIAC CATHETERIZATION  2004   1 vessel CAD  . COLONOSCOPY  2458,0998  . KNEE ARTHROSCOPY Left   .  LASIK    . [place]   x2  . NASAL SINUS SURGERY  2001  . NASAL SINUS SURGERY  2002    Current Facility-Administered Medications  Medication Dose Route Frequency Provider Last Rate Last Dose  . ceFAZolin (ANCEF) IVPB 2g/100 mL premix  2 g Intravenous On Call to OR Pete Pelt, PA-C      . chlorhexidine (HIBICLENS) 4 % liquid 4 application  60 mL Topical Once Pete Pelt, PA-C      . Chlorhexidine Gluconate Cloth 2 % PADS 6 each  6 each Topical Q0600 Mcarthur Rossetti, MD      . lactated ringers infusion   Intravenous Continuous Suzette Battiest, MD   Stopped at 09/12/17 0630  . mupirocin ointment (BACTROBAN) 2 % 1 application  1 application Nasal BID Mcarthur Rossetti, MD      . tranexamic acid (CYKLOKAPRON) 1,000 mg in sodium chloride 0.9 % 100 mL IVPB  1,000 mg Intravenous To OR Mcarthur Rossetti, MD      . vancomycin (VANCOCIN) 1,500 mg in sodium chloride 0.9 % 500 mL IVPB  1,500 mg Intravenous Once Mcarthur Rossetti, MD 250 mL/hr at 09/12/17 0630 1,500 mg at 09/12/17 0630   Facility-Administered Medications Ordered in Other Encounters  Medication Dose Route Frequency Provider Last Rate Last Dose  . fentaNYL (SUBLIMAZE) injection   Intravenous Anesthesia Intra-op Genelle Bal, CRNA   50 mcg at 09/12/17 4097  . midazolam (VERSED) 5 MG/5ML injection   Intravenous Anesthesia Intra-op Genelle Bal, CRNA   1 mg at 09/12/17 3532   No Known Allergies  Social History   Tobacco Use  . Smoking status: Never Smoker  . Smokeless tobacco: Never Used  Substance Use Topics  . Alcohol use: No    Family History  Problem Relation Age of Onset  . Heart disease Father   . Diabetes Unknown        GM     Review of Systems  Musculoskeletal: Positive for joint pain.  All other systems reviewed and are negative.   Objective:  Physical Exam  Constitutional: He is oriented to person, place, and time. He appears well-developed and well-nourished.  HENT:  Head: Normocephalic and atraumatic.  Eyes: Pupils are equal, round, and reactive to light. EOM are normal.  Neck: Normal range of motion. Neck supple.  Cardiovascular: Normal rate and regular rhythm.  Respiratory: Effort normal and breath sounds normal.  GI: Soft. Bowel sounds are normal.  Musculoskeletal:       Left knee: He exhibits decreased range of motion, effusion and abnormal alignment. Tenderness found. Medial joint line tenderness noted.   Neurological: He is alert and oriented to person, place, and time.  Skin: Skin is warm and dry.  Psychiatric: He has a normal mood and affect.    Vital signs in last 24 hours: Temp:  [98.6 F (37 C)] 98.6 F (37 C) (07/05 0545) Pulse Rate:  [72] 72 (07/05 0545) Resp:  [16] 16 (07/05 0545) BP: (162)/(98) 162/98 (07/05 0545) SpO2:  [96 %] 96 % (07/05 0545) Weight:  [296 lb 2 oz (134.3 kg)] 296 lb 2 oz (134.3 kg) (07/05 0538)  Labs:   Estimated body mass index is 35.12 kg/m as calculated from the following:   Height as of this encounter: 6\' 5"  (1.956 m).   Weight as of this encounter: 296 lb 2 oz (134.3 kg).   Imaging Review Plain radiographs demonstrate severe degenerative joint  disease of the left knee(s). The overall alignment ismild varus. The bone quality appears to be excellent for age and reported activity level.   Preoperative templating of the joint replacement has been completed, documented, and submitted to the Operating Room personnel in order to optimize intra-operative equipment management.   Anticipated LOS equal to or greater than 2 midnights due to - Age 60 and older with one or more of the following:  - Obesity  - Expected need for hospital services (PT, OT, Nursing) required for safe  discharge  - Anticipated need for postoperative skilled nursing care or inpatient rehab  - Active co-morbidities: None OR   - Unanticipated findings during/Post Surgery: None  - Patient is a high risk of re-admission due to: None     Assessment/Plan:  End stage arthritis, left knee   The patient history, physical examination, clinical judgment of the provider and imaging studies are consistent with end stage degenerative joint disease of the left knee(s) and total knee arthroplasty is deemed medically necessary. The treatment options including medical management, injection therapy arthroscopy and arthroplasty were discussed at length. The risks and benefits of total  knee arthroplasty were presented and reviewed. The risks due to aseptic loosening, infection, stiffness, patella tracking problems, thromboembolic complications and other imponderables were discussed. The patient acknowledged the explanation, agreed to proceed with the plan and consent was signed. Patient is being admitted for inpatient treatment for surgery, pain control, PT, OT, prophylactic antibiotics, VTE prophylaxis, progressive ambulation and ADL's and discharge planning. The patient is planning to be discharged home with home health services

## 2017-09-12 NOTE — Progress Notes (Signed)
Pt. placed on CPAP for h/s, added 2 lpm oxygen into circuit, wife remains at bedside, tolerating well.

## 2017-09-12 NOTE — Transfer of Care (Signed)
Immediate Anesthesia Transfer of Care Note  Patient: James Rodgers  Procedure(s) Performed: LEFT TOTAL KNEE ARTHROPLASTY (Left Knee)  Patient Location: PACU  Anesthesia Type:Spinal and MAC combined with regional for post-op pain  Level of Consciousness: awake, alert  and oriented  Airway & Oxygen Therapy: Patient Spontanous Breathing and Patient connected to face mask oxygen  Post-op Assessment: Report given to RN and Post -op Vital signs reviewed and stable  Post vital signs: Reviewed and stable  Last Vitals:  Vitals Value Taken Time  BP 98/70 09/12/2017  9:04 AM  Temp    Pulse 66 09/12/2017  9:05 AM  Resp 16 09/12/2017  9:05 AM  SpO2 98 % 09/12/2017  9:05 AM  Vitals shown include unvalidated device data.  Last Pain:  Vitals:   09/12/17 0626  TempSrc:   PainSc: 0-No pain         Complications: No apparent anesthesia complications

## 2017-09-13 LAB — BASIC METABOLIC PANEL
ANION GAP: 8 (ref 5–15)
BUN: 18 mg/dL (ref 8–23)
CALCIUM: 9 mg/dL (ref 8.9–10.3)
CO2: 28 mmol/L (ref 22–32)
Chloride: 104 mmol/L (ref 98–111)
Creatinine, Ser: 1 mg/dL (ref 0.61–1.24)
Glucose, Bld: 168 mg/dL — ABNORMAL HIGH (ref 70–99)
Potassium: 4.6 mmol/L (ref 3.5–5.1)
Sodium: 140 mmol/L (ref 135–145)

## 2017-09-13 LAB — CBC
HEMATOCRIT: 42.3 % (ref 39.0–52.0)
Hemoglobin: 13.8 g/dL (ref 13.0–17.0)
MCH: 28.3 pg (ref 26.0–34.0)
MCHC: 32.6 g/dL (ref 30.0–36.0)
MCV: 86.9 fL (ref 78.0–100.0)
PLATELETS: 182 10*3/uL (ref 150–400)
RBC: 4.87 MIL/uL (ref 4.22–5.81)
RDW: 13.9 % (ref 11.5–15.5)
WBC: 10.7 10*3/uL — AB (ref 4.0–10.5)

## 2017-09-13 LAB — GLUCOSE, CAPILLARY
GLUCOSE-CAPILLARY: 137 mg/dL — AB (ref 70–99)
GLUCOSE-CAPILLARY: 128 mg/dL — AB (ref 70–99)
GLUCOSE-CAPILLARY: 106 mg/dL — AB (ref 70–99)
Glucose-Capillary: 136 mg/dL — ABNORMAL HIGH (ref 70–99)

## 2017-09-13 MED ORDER — ONDANSETRON HCL 4 MG PO TABS: 4.0000 mg | ORAL_TABLET | Freq: Four times a day (QID) | ORAL | 1 refills | Status: DC | PRN

## 2017-09-13 MED ORDER — SODIUM CHLORIDE 0.9 % IV BOLUS
500.0000 mL | Freq: Once | INTRAVENOUS | Status: AC
  Administered 2017-09-13: 500 mL via INTRAVENOUS

## 2017-09-13 MED ORDER — HYDROMORPHONE HCL 2 MG PO TABS
2.0000 mg | ORAL_TABLET | ORAL | Status: DC | PRN
  Administered 2017-09-13 – 2017-09-14 (×4): 2 mg via ORAL
  Filled 2017-09-13 (×5): qty 1

## 2017-09-13 MED ORDER — METHOCARBAMOL 500 MG PO TABS: 500.0000 mg | ORAL_TABLET | Freq: Four times a day (QID) | ORAL | 1 refills | Status: DC | PRN

## 2017-09-13 MED ORDER — OXYCODONE HCL 5 MG PO TABS: 5.0000 mg | ORAL_TABLET | ORAL | 0 refills | Status: DC | PRN

## 2017-09-13 NOTE — Progress Notes (Signed)
Subjective: 1 Day Post-Op Procedure(s) (LRB): LEFT TOTAL KNEE ARTHROPLASTY (Left) Patient reports pain as moderate.   Nausea with pain meds but reports he tolerates pain med if given with nausea med.  Objective: Vital signs in last 24 hours: Temp:  [97.5 F (36.4 C)-98.4 F (36.9 C)] 98.4 F (36.9 C) (07/06 0644) Pulse Rate:  [57-98] 80 (07/06 0644) Resp:  [9-18] 16 (07/06 0644) BP: (98-158)/(66-109) 127/66 (07/06 0644) SpO2:  [91 %-100 %] 97 % (07/06 0644)  Intake/Output from previous day: 07/05 0701 - 07/06 0700 In: 2928.8 [I.V.:2928.8] Out: 2400 [Urine:2350; Blood:50] Intake/Output this shift: No intake/output data recorded.  Recent Labs    09/13/17 0505  HGB 13.8   Recent Labs    09/13/17 0505  WBC 10.7*  RBC 4.87  HCT 42.3  PLT 182   Recent Labs    09/13/17 0505  NA 140  K 4.6  CL 104  CO2 28  BUN 18  CREATININE 1.00  GLUCOSE 168*  CALCIUM 9.0   No results for input(s): LABPT, INR in the last 72 hours.  Left lower extremity: Sensation intact distally Intact pulses distally Dorsiflexion/Plantar flexion intact Incision: dressing C/D/I Compartment soft    Assessment/Plan: 1 Day Post-Op Procedure(s) (LRB): LEFT TOTAL KNEE ARTHROPLASTY (Left) Up with therapy  Possible discharge to home tomorrow.     Charlotte Brafford 09/13/2017, 8:28 AM

## 2017-09-13 NOTE — Plan of Care (Signed)
Pt alert and oriented, resting with wife at the bedside.  Dizziness and nausea this am with Pt.  Pt very sensitive to oxycodone pills, MD changed pain meds, and we will try those to see if pt able to better tolerate.  RN will monitor.

## 2017-09-13 NOTE — Progress Notes (Signed)
OT Cancellation Note  Patient Details Name: DEVARION MCCLANAHAN MRN: [id number] DOB: 1950/05/19   Cancelled Treatment:    Reason Eval/Treat Not Completed: Other (comment).  Pt is limited by nausea and dizziness per PT. Will check on him tomorrow.  Orley Lawry 09/13/2017, 1:25 PM  Lesle Chris, OTR/L [phone] 09/13/2017

## 2017-09-13 NOTE — Discharge Instructions (Signed)

## 2017-09-13 NOTE — Progress Notes (Signed)
Physical Therapy Treatment Patient Details Name: James Rodgers MRN: [id number] DOB: 05/07/1950 Today's Date: 09/13/2017    History of Present Illness Pt s/p L TKR and with hx of CAD, DDD, and back surgery    PT Comments    Pt continues cooperative but ltd by fatigue and c/o dizziness with attempts to mobilize.  On testing, pt with significant orthostatic hypotension - readings provided to and recorded by RN.  Follow Up Recommendations  Home health PT;Follow surgeon's recommendation for DC plan and follow-up therapies     Equipment Recommendations  None recommended by PT    Recommendations for Other Services       Precautions / Restrictions Precautions Precautions: Knee;Fall Required Braces or Orthoses: Knee Immobilizer - Left Knee Immobilizer - Left: Discontinue once straight leg raise with < 10 degree lag Restrictions Weight Bearing Restrictions: No Other Position/Activity Restrictions: WBAT    Mobility  Bed Mobility Overal bed mobility: Needs Assistance Bed Mobility: Supine to Sit     Supine to sit: Min assist;Mod assist     General bed mobility comments: modified roll OOB 2* back issues  Transfers Overall transfer level: Needs assistance Equipment used: Rolling walker (2 wheeled) Transfers: Sit to/from Stand Sit to Stand: Min assist;+2 physical assistance;+2 safety/equipment         General transfer comment: cues for LE management and use of UEs to self assist  Ambulation/Gait Ambulation/Gait assistance: Min assist;+2 physical assistance;+2 safety/equipment Gait Distance (Feet): 4 Feet Assistive device: Rolling walker (2 wheeled) Gait Pattern/deviations: Step-to pattern;Decreased step length - right;Decreased step length - left;Shuffle;Trunk flexed Gait velocity: decr   General Gait Details: cues for sequence, posture and position from RW - distance ltd by increasing dizziness   Stairs             Wheelchair Mobility    Modified Rankin  (Stroke Patients Only)       Balance                                            Cognition Arousal/Alertness: Awake/alert Behavior During Therapy: WFL for tasks assessed/performed Overall Cognitive Status: Within Functional Limits for tasks assessed                                        Exercises      General Comments        Pertinent Vitals/Pain Pain Assessment: 0-10 Pain Score: 4  Pain Location: L knee Pain Descriptors / Indicators: Aching;Sore Pain Intervention(s): Limited activity within patient's tolerance;Monitored during session;Premedicated before session;Ice applied    Home Living                      Prior Function            PT Goals (current goals can now be found in the care plan section) Acute Rehab PT Goals Patient Stated Goal: Regain IND PT Goal Formulation: With patient Time For Goal Achievement: 09/19/17 Potential to Achieve Goals: Good Progress towards PT goals: Not progressing toward goals - comment(continues dizziness with mobilization)    Frequency    7X/week      PT Plan Current plan remains appropriate    Co-evaluation              AM-PAC PT "  6 Clicks" Daily Activity  Outcome Measure  Difficulty turning over in bed (including adjusting bedclothes, sheets and blankets)?: Unable Difficulty moving from lying on back to sitting on the side of the bed? : Unable Difficulty sitting down on and standing up from a chair with arms (e.g., wheelchair, bedside commode, etc,.)?: Unable Help needed moving to and from a bed to chair (including a wheelchair)?: A Little Help needed walking in hospital room?: A Little Help needed climbing 3-5 steps with a railing? : A Lot 6 Click Score: 11    End of Session Equipment Utilized During Treatment: Left knee immobilizer;Gait belt Activity Tolerance: Patient limited by fatigue;Other (comment)(dizziness) Patient left: with call bell/phone within  reach;in bed;with family/visitor present Nurse Communication: Mobility status PT Visit Diagnosis: Difficulty in walking, not elsewhere classified (R26.2)     Time: 0814-4818 PT Time Calculation (min) (ACUTE ONLY): 26 min  Charges:  $Gait Training: 8-22 mins $Therapeutic Activity: 8-22 mins                    G Codes:       Pg [phone]    Graycen Degan 09/13/2017, 4:30 PM

## 2017-09-13 NOTE — Progress Notes (Signed)
Physical Therapy Treatment Patient Details Name: James Rodgers MRN: [id number] DOB: 1950/05/23 Today's Date: 09/13/2017    History of Present Illness Pt s/p L TKR and with hx of CAD, DDD, and back surgery    PT Comments    Pt cooperative but ltd this date by nausea and dizziness - RN aware.   Follow Up Recommendations  Home health PT;Follow surgeon's recommendation for DC plan and follow-up therapies     Equipment Recommendations  None recommended by PT    Recommendations for Other Services       Precautions / Restrictions Precautions Precautions: Knee;Fall Required Braces or Orthoses: Knee Immobilizer - Left Knee Immobilizer - Left: Discontinue once straight leg raise with < 10 degree lag Restrictions Weight Bearing Restrictions: No Other Position/Activity Restrictions: WBAT    Mobility  Bed Mobility Overal bed mobility: Needs Assistance Bed Mobility: Supine to Sit;Sit to Supine     Supine to sit: Min assist Sit to supine: Min assist   General bed mobility comments: Increased time with cues for sequence and use of R LE to self assist  Transfers                 General transfer comment: NT 2* pt c/o increasing nausea and dizziness in sitting - BP 112/72 - RN ware  Ambulation/Gait                 Stairs             Wheelchair Mobility    Modified Rankin (Stroke Patients Only)       Balance                                            Cognition Arousal/Alertness: Awake/alert Behavior During Therapy: WFL for tasks assessed/performed Overall Cognitive Status: Within Functional Limits for tasks assessed                                        Exercises Total Joint Exercises Ankle Circles/Pumps: AROM;Both;15 reps;Supine Quad Sets: AROM;Both;10 reps;Supine Heel Slides: AAROM;Left;10 reps;Supine Straight Leg Raises: AAROM;Left;10 reps;Supine    General Comments        Pertinent Vitals/Pain  Pain Assessment: 0-10 Pain Score: 5  Pain Location: L knee Pain Descriptors / Indicators: Aching;Sore Pain Intervention(s): Limited activity within patient's tolerance;Monitored during session;Premedicated before session;Ice applied    Home Living                      Prior Function            PT Goals (current goals can now be found in the care plan section) Acute Rehab PT Goals Patient Stated Goal: Regain IND PT Goal Formulation: With patient Time For Goal Achievement: 09/19/17 Potential to Achieve Goals: Good Progress towards PT goals: Not progressing toward goals - comment(nausea and dizziness)    Frequency    7X/week      PT Plan Current plan remains appropriate    Co-evaluation              AM-PAC PT "6 Clicks" Daily Activity  Outcome Measure  Difficulty turning over in bed (including adjusting bedclothes, sheets and blankets)?: Unable Difficulty moving from lying on back to sitting on the side of the bed? : Unable  Difficulty sitting down on and standing up from a chair with arms (e.g., wheelchair, bedside commode, etc,.)?: Unable Help needed moving to and from a bed to chair (including a wheelchair)?: A Little Help needed walking in hospital room?: A Little Help needed climbing 3-5 steps with a railing? : A Lot 6 Click Score: 11    End of Session Equipment Utilized During Treatment: Left knee immobilizer Activity Tolerance: Patient limited by fatigue;Other (comment)(nausea and dizziness) Patient left: with call bell/phone within reach;in bed;with family/visitor present Nurse Communication: Mobility status(nausea and dizziness) PT Visit Diagnosis: Difficulty in walking, not elsewhere classified (R26.2)     Time: 2505-3976 PT Time Calculation (min) (ACUTE ONLY): 38 min  Charges:  $Therapeutic Exercise: 8-22 mins $Therapeutic Activity: 8-22 mins                    G Codes:       Pg [phone]    James Rodgers 09/13/2017, 12:09  PM

## 2017-09-13 NOTE — Progress Notes (Signed)
Physical Therapy Treatment Patient Details Name: James Rodgers MRN: [id number] DOB: 1950-10-23 Today's Date: 09/13/2017    History of Present Illness Pt s/p L TKR and with hx of CAD, DDD, and back surgery    PT Comments    Pt assisted back to bed with c/o dizziness initiating upon standing and worsening with ambulation short distance chair to bed.  Follow Up Recommendations  Home health PT;Follow surgeon's recommendation for DC plan and follow-up therapies     Equipment Recommendations  None recommended by PT    Recommendations for Other Services       Precautions / Restrictions Precautions Precautions: Knee;Fall Required Braces or Orthoses: Knee Immobilizer - Left Knee Immobilizer - Left: Discontinue once straight leg raise with < 10 degree lag Restrictions Weight Bearing Restrictions: No Other Position/Activity Restrictions: WBAT    Mobility  Bed Mobility Overal bed mobility: Needs Assistance Bed Mobility: Sit to Supine     Supine to sit: Min assist;Mod assist Sit to supine: Min assist;Mod assist   General bed mobility comments: modified roll  2* back issues  Transfers Overall transfer level: Needs assistance Equipment used: Rolling walker (2 wheeled) Transfers: Sit to/from Stand Sit to Stand: Min assist;+2 physical assistance;+2 safety/equipment         General transfer comment: cues for LE management and use of UEs to self assist  Ambulation/Gait Ambulation/Gait assistance: Min assist;+2 physical assistance;+2 safety/equipment Gait Distance (Feet): 4 Feet Assistive device: Rolling walker (2 wheeled) Gait Pattern/deviations: Step-to pattern;Decreased step length - right;Decreased step length - left;Shuffle;Trunk flexed Gait velocity: decr   General Gait Details: cues for sequence, posture and position from RW - distance ltd by increasing dizziness   Stairs             Wheelchair Mobility    Modified Rankin (Stroke Patients Only)        Balance                                            Cognition Arousal/Alertness: Awake/alert Behavior During Therapy: WFL for tasks assessed/performed Overall Cognitive Status: Within Functional Limits for tasks assessed                                        Exercises      General Comments        Pertinent Vitals/Pain Pain Assessment: 0-10 Pain Score: 4  Pain Location: L knee Pain Descriptors / Indicators: Aching;Sore Pain Intervention(s): Limited activity within patient's tolerance;Monitored during session;Premedicated before session;Ice applied    Home Living                      Prior Function            PT Goals (current goals can now be found in the care plan section) Acute Rehab PT Goals Patient Stated Goal: Regain IND PT Goal Formulation: With patient Time For Goal Achievement: 09/19/17 Potential to Achieve Goals: Good Progress towards PT goals: Progressing toward goals;Not progressing toward goals - comment(continued dizziness with attempts to mobilize)    Frequency    7X/week      PT Plan Current plan remains appropriate    Co-evaluation              AM-PAC PT "6 Clicks"  Daily Activity  Outcome Measure  Difficulty turning over in bed (including adjusting bedclothes, sheets and blankets)?: Unable Difficulty moving from lying on back to sitting on the side of the bed? : Unable Difficulty sitting down on and standing up from a chair with arms (e.g., wheelchair, bedside commode, etc,.)?: Unable Help needed moving to and from a bed to chair (including a wheelchair)?: A Little Help needed walking in hospital room?: A Little Help needed climbing 3-5 steps with a railing? : A Lot 6 Click Score: 11    End of Session Equipment Utilized During Treatment: Left knee immobilizer;Gait belt Activity Tolerance: Patient limited by fatigue;Other (comment) Patient left: with call bell/phone within reach;in  bed;with family/visitor present Nurse Communication: Mobility status PT Visit Diagnosis: Difficulty in walking, not elsewhere classified (R26.2)     Time: 1438-8875 PT Time Calculation (min) (ACUTE ONLY): 12 min  Charges:  $Gait Training: 8-22 mins $Therapeutic Activity: 8-22 mins                    G Codes:       Pg [phone]    Mohmed Farver 09/13/2017, 4:35 PM

## 2017-09-14 LAB — GLUCOSE, CAPILLARY
GLUCOSE-CAPILLARY: 121 mg/dL — AB (ref 70–99)
GLUCOSE-CAPILLARY: 134 mg/dL — AB (ref 70–99)
Glucose-Capillary: 118 mg/dL — ABNORMAL HIGH (ref 70–99)
Glucose-Capillary: 145 mg/dL — ABNORMAL HIGH (ref 70–99)

## 2017-09-14 MED ORDER — OXYCODONE HCL 5 MG PO TABS
5.0000 mg | ORAL_TABLET | ORAL | Status: DC | PRN
  Filled 2017-09-14: qty 2

## 2017-09-14 MED ORDER — HYDROMORPHONE HCL 2 MG PO TABS
2.0000 mg | ORAL_TABLET | ORAL | Status: DC | PRN
Start: 2017-09-14 — End: 2017-09-17
  Administered 2017-09-14 – 2017-09-16 (×8): 2 mg via ORAL
  Filled 2017-09-14 (×8): qty 1

## 2017-09-14 NOTE — Care Management Note (Signed)
Case Management Note  Patient Details  Name: James Rodgers MRN: [id number] Date of Birth: 1950/08/29  Subjective/Objective:    S/p L TKR             Action/Plan: NCM spoke to pt and wife at bedside. Offered choice for HH/list provided. Pt agreeable to [facility] for [facility]. (preoperatively arranged by surgeon's office) Pt requested 3n1 and cane for home. [facility] delivered to room.   Expected Discharge Date:                  Expected Discharge Plan:  [place]  In-House Referral:  NA  Discharge planning Services  CM Consult  Post Acute Care Choice:  Home Health Choice offered to:  Patient  DME Arranged:  3-N-1, Cane DME Agency:  [place]:  PT [facility]:  [facility]  Status of Service:  Completed, signed off  If discussed at [employer] of Stay Meetings, dates discussed:    Additional Comments:  Erenest Rasher, RN 09/14/2017, 11:10 AM

## 2017-09-14 NOTE — Progress Notes (Addendum)
Physical Therapy Treatment Patient Details Name: James Rodgers MRN: [id number] DOB: 07-20-1950 Today's Date: 09/14/2017    History of Present Illness Pt s/p L TKR and with hx of CAD, DDD, and back surgery    PT Comments    Pt remains orthostatic, BP supine 144/85, sitting 144/82, pt unable to tolerate sitting more than ~3 minutes 2* worsening dizziness. Performed TKA exercises in supine and sitting. Unable to ambulate this morning 2* dizziness. Encouraged pt to gradually raise head of bed as tolerated. RN aware of orthostatic hypotension.    Follow Up Recommendations  Home health PT;Follow surgeon's recommendation for DC plan and follow-up therapies     Equipment Recommendations  None recommended by PT    Recommendations for Other Services       Precautions / Restrictions Precautions Precautions: Knee;Fall Required Braces or Orthoses: Knee Immobilizer - Left Knee Immobilizer - Left: Discontinue once straight leg raise with < 10 degree lag Restrictions Weight Bearing Restrictions: No Other Position/Activity Restrictions: WBAT    Mobility  Bed Mobility Overal bed mobility: Needs Assistance Bed Mobility: Sit to Supine;Supine to Sit     Supine to sit: Mod assist Sit to supine: Min assist   General bed mobility comments: min A to support LLE; pt became progressively more dizzy while sitting on edge of bed, after 3 minutes he reported he needed to lie back down. BP supine 144/85, sitting 114/82, RN notified  Transfers                 General transfer comment: unable 2* dizziness in sitting  Ambulation/Gait                 Stairs             Wheelchair Mobility    Modified Rankin (Stroke Patients Only)       Balance Overall balance assessment: Needs assistance Sitting-balance support: Feet supported Sitting balance-Leahy Scale: Fair         Standing balance comment: didn't stand 2* orthostatic                             Cognition Arousal/Alertness: Awake/alert Behavior During Therapy: WFL for tasks assessed/performed Overall Cognitive Status: Within Functional Limits for tasks assessed                                        Exercises Total Joint Exercises Ankle Circles/Pumps: AROM;Both;15 reps;Supine Quad Sets: AROM;Both;10 reps;Supine Towel Squeeze: AROM;Both;10 reps;Supine Short Arc Quad: AAROM;Left;10 reps;Supine Heel Slides: AAROM;Left;Supine;15 reps Hip ABduction/ADduction: AAROM;Left;10 reps;Supine Straight Leg Raises: AAROM;Left;10 reps;Supine Long Arc Quad: AAROM;Left;Seated;5 reps Knee Flexion: AAROM;Left;5 reps;Seated Goniometric ROM: 5-45* AAROM L knee    General Comments General comments (skin integrity, edema, etc.): pt orthostatic:  did not stand today      Pertinent Vitals/Pain Pain Score: 4  Pain Location: L knee Pain Descriptors / Indicators: Aching;Sore Pain Intervention(s): Limited activity within patient's tolerance;Monitored during session;Premedicated before session;Ice applied    Home Living Family/patient expects to be discharged to:: Private residence Living Arrangements: Spouse/significant other Available Help at Discharge: Family         Home Equipment: Tub bench Additional Comments: Pt's 3 y o mother lives with them and has tub bench.  One bathroom is in a tight closet, other is more open    Prior Function  PT Goals (current goals can now be found in the care plan section) Acute Rehab PT Goals Patient Stated Goal: play golf PT Goal Formulation: With patient/family Time For Goal Achievement: 09/19/17 Potential to Achieve Goals: Good Progress towards PT goals: Not progressing toward goals - comment    Frequency    7X/week      PT Plan Current plan remains appropriate    Co-evaluation              AM-PAC PT "6 Clicks" Daily Activity  Outcome Measure  Difficulty turning over in bed (including adjusting  bedclothes, sheets and blankets)?: Unable Difficulty moving from lying on back to sitting on the side of the bed? : Unable Difficulty sitting down on and standing up from a chair with arms (e.g., wheelchair, bedside commode, etc,.)?: Unable Help needed moving to and from a bed to chair (including a wheelchair)?: A Lot Help needed walking in hospital room?: A Lot Help needed climbing 3-5 steps with a railing? : A Lot 6 Click Score: 9    End of Session Equipment Utilized During Treatment: Gait belt Activity Tolerance: Other (comment)(orthostatic with dizziness in sitting) Patient left: with call bell/phone within reach;in bed;with family/visitor present Nurse Communication: Mobility status PT Visit Diagnosis: Difficulty in walking, not elsewhere classified (R26.2)     Time: 0076-2263 PT Time Calculation (min) (ACUTE ONLY): 44 min  Charges:  $Therapeutic Exercise: 23-37 mins $Therapeutic Activity: 8-22 mins                    G Codes:          Philomena Doheny 09/14/2017, 11:50 AM [phone]

## 2017-09-14 NOTE — Progress Notes (Signed)
Dilaudid PO order discontinued and oxycodone restarted. Patient stated oxycodone makes him nauseous and dizzy, even laying down. Paged Benita Stabile, PA to make him aware and he restarted PO dilaudid and discontinued oxycodone again.

## 2017-09-14 NOTE — Progress Notes (Signed)
Patient ID: James Rodgers, male   DOB: 1950-10-28, 67 y.o.   MRN: [id number] Pain under good control, but still problems with orthostatic hypotension.  Will need to hold his BP meds except for metoprolol.  Will keep here another day as he adjusts.  Understands the need to get up very slowly.  Left knee stable otherwise.

## 2017-09-14 NOTE — Progress Notes (Signed)
Physical Therapy Treatment Patient Details Name: James Rodgers MRN: [id number] DOB: 1950/08/28 Today's Date: 09/14/2017    History of Present Illness Pt s/p L TKR and with hx of CAD, DDD, and back surgery    PT Comments    Performed supine TKA exercises. Pt again orthostatic with dizziness in sitting. BP supine 125/78, sitting 93/68. Pt tolerated sitting on edge of bed for 2 minutes, then needed to lie down 2* dizziness.   Follow Up Recommendations  Home health PT;Follow surgeon's recommendation for DC plan and follow-up therapies     Equipment Recommendations  None recommended by PT    Recommendations for Other Services       Precautions / Restrictions Precautions Precautions: Knee;Fall Required Braces or Orthoses: Knee Immobilizer - Left Knee Immobilizer - Left: Discontinue once straight leg raise with < 10 degree lag Restrictions Other Position/Activity Restrictions: WBAT    Mobility  Bed Mobility Overal bed mobility: Needs Assistance Bed Mobility: Sit to Supine;Supine to Sit       Sit to supine: Min assist   General bed mobility comments: min A to support LLE; pt became progressively more dizzy while sitting on edge of bed, after 2 minutes he reported he needed to lie back down. BP supine 144/85, sitting 114/82, RN notified  Transfers                 General transfer comment: unable 2* dizziness in sitting  Ambulation/Gait                 Stairs             Wheelchair Mobility    Modified Rankin (Stroke Patients Only)       Balance Overall balance assessment: Needs assistance Sitting-balance support: Feet supported Sitting balance-Leahy Scale: Fair         Standing balance comment: didn't stand 2* orthostatic                            Cognition Arousal/Alertness: Awake/alert Behavior During Therapy: WFL for tasks assessed/performed Overall Cognitive Status: Within Functional Limits for tasks assessed                                         Exercises Total Joint Exercises Ankle Circles/Pumps: AROM;Both;15 reps;Supine Quad Sets: AROM;Both;10 reps;Supine Towel Squeeze: AROM;Both;10 reps;Supine Short Arc Quad: AAROM;Left;10 reps;Supine Heel Slides: AAROM;Left;Supine;15 reps Hip ABduction/ADduction: AAROM;Left;10 reps;Supine Straight Leg Raises: AAROM;Left;10 reps;Supine Long Arc Quad: AAROM;Left;Seated;5 reps Knee Flexion: AAROM;Left;5 reps;Seated Goniometric ROM: 5-45* AAROM L knee    General Comments        Pertinent Vitals/Pain Pain Score: 4  Pain Location: L knee Pain Descriptors / Indicators: Aching;Sore Pain Intervention(s): Limited activity within patient's tolerance;Monitored during session;Premedicated before session;Ice applied    Home Living                      Prior Function            PT Goals (current goals can now be found in the care plan section) Acute Rehab PT Goals Patient Stated Goal: play golf PT Goal Formulation: With patient/family Time For Goal Achievement: 09/19/17 Potential to Achieve Goals: Good Progress towards PT goals: Not progressing toward goals - comment(orthostatic)    Frequency    7X/week      PT Plan Current plan  remains appropriate    Co-evaluation              AM-PAC PT "6 Clicks" Daily Activity  Outcome Measure  Difficulty turning over in bed (including adjusting bedclothes, sheets and blankets)?: Unable Difficulty moving from lying on back to sitting on the side of the bed? : Unable Difficulty sitting down on and standing up from a chair with arms (e.g., wheelchair, bedside commode, etc,.)?: Unable Help needed moving to and from a bed to chair (including a wheelchair)?: A Lot Help needed walking in hospital room?: A Lot Help needed climbing 3-5 steps with a railing? : A Lot 6 Click Score: 9    End of Session Equipment Utilized During Treatment: Gait belt Activity Tolerance: Other  (comment)(orthostatic with dizziness in sitting) Patient left: with call bell/phone within reach;in bed;with family/visitor present Nurse Communication: Mobility status PT Visit Diagnosis: Difficulty in walking, not elsewhere classified (R26.2)     Time: 1443-1510 PT Time Calculation (min) (ACUTE ONLY): 27 min  Charges:  $Therapeutic Exercise: 23-37 mins $Therapeutic Activity: 8-22 mins                    G Codes:          Philomena Doheny 09/14/2017, 3:22 PM [phone]

## 2017-09-14 NOTE — Evaluation (Signed)
Occupational Therapy Evaluation Patient Details Name: James Rodgers MRN: [id number] DOB: 14-Jan-1951 Today's Date: 09/14/2017    History of Present Illness Pt s/p L TKR and with hx of CAD, DDD, and back surgery   Clinical Impression   This 67 year old man was admitted for the above.  He was limited by orthostatic BP today. Only sat EOB and BP did not rebound after a couple of minutes, so pt returned to supine. Wife present and will assist with adls. Will follow in acute setting with min guard to min A level goals for bathroom transfers    Follow Up Recommendations  Supervision/Assistance - 24 hour    Equipment Recommendations  3 in 1 bedside commode    Recommendations for Other Services       Precautions / Restrictions Precautions Precautions: Knee;Fall Required Braces or Orthoses: Knee Immobilizer - Left Knee Immobilizer - Left: Discontinue once straight leg raise with < 10 degree lag Restrictions Weight Bearing Restrictions: No Other Position/Activity Restrictions: WBAT      Mobility Bed Mobility         Supine to sit: Mod assist Sit to supine: Min assist;Mod assist   General bed mobility comments: wife present and assisted  Transfers                 General transfer comment: not performed    Balance                                           ADL either performed or assessed with clinical judgement   ADL Overall ADL's : Needs assistance/impaired Eating/Feeding: Independent   Grooming: Set up   Upper Body Bathing: Set up   Lower Body Bathing: Maximal assistance   Upper Body Dressing : Minimal assistance   Lower Body Dressing: Total assistance                 General ADL Comments: pt limited by orthostatic BP. Did not feel nauseous today but felt off when sitting up.  See vitals section of chart. Systolic BP decreased by 5 sitting for a couple of minutes     Vision         Perception     Praxis       Pertinent Vitals/Pain Pain Score: 4  Pain Location: L knee Pain Descriptors / Indicators: Aching;Sore Pain Intervention(s): Limited activity within patient's tolerance;Monitored during session;Premedicated before session;Repositioned;Ice applied     Hand Dominance     Extremity/Trunk Assessment Upper Extremity Assessment Upper Extremity Assessment: Overall WFL for tasks assessed           Communication Communication Communication: No difficulties   Cognition Arousal/Alertness: Awake/alert Behavior During Therapy: WFL for tasks assessed/performed Overall Cognitive Status: Within Functional Limits for tasks assessed                                     General Comments  pt orthostatic:  did not stand today    Exercises     Shoulder Instructions      Home Living Family/patient expects to be discharged to:: Private residence Living Arrangements: Spouse/significant other Available Help at Discharge: Family               Bathroom Shower/Tub: Teacher, early years/pre: Handicapped height  Home Equipment: Tub bench   Additional Comments: Pt's 26 y o mother lives with them and has tub bench.  One bathroom is in a tight closet, other is more open      Prior Functioning/Environment                   OT Problem List: Decreased strength;Decreased activity tolerance;Cardiopulmonary status limiting activity;Decreased knowledge of use of DME or AE;Pain      OT Treatment/Interventions: Self-care/ADL training;DME and/or AE instruction;Patient/family education    OT Goals(Current goals can be found in the care plan section) Acute Rehab OT Goals Patient Stated Goal: Regain IND OT Goal Formulation: With patient Time For Goal Achievement: 09/28/17 Potential to Achieve Goals: Good ADL Goals Pt Will Transfer to Toilet: with min guard assist;ambulating;bedside commode(vs high) Pt Will Perform Tub/Shower Transfer: Tub  transfer;ambulating;tub bench;with min assist  OT Frequency: Min 2X/week   Barriers to D/C:            Co-evaluation              AM-PAC PT "6 Clicks" Daily Activity     Outcome Measure Help from another person eating meals?: None Help from another person taking care of personal grooming?: A Little Help from another person toileting, which includes using toliet, bedpan, or urinal?: A Lot Help from another person bathing (including washing, rinsing, drying)?: A Lot Help from another person to put on and taking off regular upper body clothing?: A Little Help from another person to put on and taking off regular lower body clothing?: Total 6 Click Score: 15   End of Session    Activity Tolerance: (BP) Patient left: in bed;with call bell/phone within reach;with family/visitor present  OT Visit Diagnosis: Pain Pain - Right/Left: Right Pain - part of body: Knee                Time: 6759-1638 OT Time Calculation (min): 23 min Charges:  OT General Charges $OT Visit: 1 Visit OT Evaluation $OT Eval Low Complexity: 1 Low G-Codes:     [place], OTR/L [phone] 09/14/2017  Latrecia Capito 09/14/2017, 10:37 AM

## 2017-09-15 ENCOUNTER — Encounter (HOSPITAL_COMMUNITY): Payer: Self-pay | Admitting: Orthopaedic Surgery

## 2017-09-15 DIAGNOSIS — Z7984 Long term (current) use of oral hypoglycemic drugs: Secondary | ICD-10-CM | POA: Diagnosis not present

## 2017-09-15 DIAGNOSIS — I10 Essential (primary) hypertension: Secondary | ICD-10-CM | POA: Diagnosis present

## 2017-09-15 DIAGNOSIS — I951 Orthostatic hypotension: Secondary | ICD-10-CM | POA: Diagnosis not present

## 2017-09-15 DIAGNOSIS — K219 Gastro-esophageal reflux disease without esophagitis: Secondary | ICD-10-CM | POA: Diagnosis present

## 2017-09-15 DIAGNOSIS — G4733 Obstructive sleep apnea (adult) (pediatric): Secondary | ICD-10-CM | POA: Diagnosis present

## 2017-09-15 DIAGNOSIS — E11319 Type 2 diabetes mellitus with unspecified diabetic retinopathy without macular edema: Secondary | ICD-10-CM | POA: Diagnosis present

## 2017-09-15 DIAGNOSIS — E785 Hyperlipidemia, unspecified: Secondary | ICD-10-CM | POA: Diagnosis present

## 2017-09-15 DIAGNOSIS — M1712 Unilateral primary osteoarthritis, left knee: Secondary | ICD-10-CM | POA: Diagnosis present

## 2017-09-15 DIAGNOSIS — I2510 Atherosclerotic heart disease of native coronary artery without angina pectoris: Secondary | ICD-10-CM | POA: Diagnosis present

## 2017-09-15 DIAGNOSIS — E669 Obesity, unspecified: Secondary | ICD-10-CM | POA: Diagnosis present

## 2017-09-15 DIAGNOSIS — Z6835 Body mass index (BMI) 35.0-35.9, adult: Secondary | ICD-10-CM | POA: Diagnosis not present

## 2017-09-15 DIAGNOSIS — Z85828 Personal history of other malignant neoplasm of skin: Secondary | ICD-10-CM | POA: Diagnosis not present

## 2017-09-15 DIAGNOSIS — Z79899 Other long term (current) drug therapy: Secondary | ICD-10-CM | POA: Diagnosis not present

## 2017-09-15 LAB — CBC
HEMATOCRIT: 39.9 % (ref 39.0–52.0)
HEMOGLOBIN: 13.6 g/dL (ref 13.0–17.0)
MCH: 29.2 pg (ref 26.0–34.0)
MCHC: 34.1 g/dL (ref 30.0–36.0)
MCV: 85.8 fL (ref 78.0–100.0)
Platelets: 217 10*3/uL (ref 150–400)
RBC: 4.65 MIL/uL (ref 4.22–5.81)
RDW: 13.9 % (ref 11.5–15.5)
WBC: 13 10*3/uL — ABNORMAL HIGH (ref 4.0–10.5)

## 2017-09-15 LAB — GLUCOSE, CAPILLARY
GLUCOSE-CAPILLARY: 134 mg/dL — AB (ref 70–99)
Glucose-Capillary: 142 mg/dL — ABNORMAL HIGH (ref 70–99)
Glucose-Capillary: 160 mg/dL — ABNORMAL HIGH (ref 70–99)
Glucose-Capillary: 143 mg/dL — ABNORMAL HIGH (ref 70–99)

## 2017-09-15 MED ORDER — METOPROLOL SUCCINATE ER 50 MG PO TB24
100.0000 mg | ORAL_TABLET | Freq: Every day | ORAL | Status: DC
  Administered 2017-09-15 – 2017-09-17 (×3): 100 mg via ORAL
  Filled 2017-09-15 (×3): qty 2

## 2017-09-15 MED ORDER — MIDODRINE HCL 5 MG PO TABS
5.0000 mg | ORAL_TABLET | Freq: Three times a day (TID) | ORAL | Status: DC
  Administered 2017-09-15 – 2017-09-17 (×6): 5 mg via ORAL
  Filled 2017-09-15 (×6): qty 1

## 2017-09-15 MED ORDER — SODIUM CHLORIDE 0.9 % IV SOLN
INTRAVENOUS | Status: DC
  Administered 2017-09-15 – 2017-09-16 (×3): via INTRAVENOUS

## 2017-09-15 NOTE — Plan of Care (Signed)
  Problem: Health Behavior/Discharge Planning: Goal: Ability to manage health-related needs will improve Outcome: Progressing   Problem: Clinical Measurements: Goal: Ability to maintain clinical measurements within normal limits will improve Outcome: Progressing   Problem: Clinical Measurements: Goal: Diagnostic test results will improve Outcome: Progressing   Problem: Activity: Goal: Risk for activity intolerance will decrease Outcome: Progressing   Problem: Coping: Goal: Level of anxiety will decrease Outcome: Progressing   Problem: Pain Managment: Goal: General experience of comfort will improve Outcome: Progressing

## 2017-09-15 NOTE — Clinical Social Work Note (Signed)
Clinical Social Work Assessment  Patient Details  Name: James Rodgers MRN: [id number] Date of Birth: 31-Oct-1950  Date of referral:  09/15/17               Reason for consult:  Facility Placement                Permission sought to share information with:  Facility Sport and exercise psychologist, Family Supports Permission granted to share information::  Yes, Verbal Permission Granted  Name::     Hobert Poplaski  Agency::     Relationship::  wife  Contact Information:  [phone]  Housing/Transportation Living arrangements for the past 2 months:  [place] of Information:  Patient, Spouse Patient Interpreter Needed:  None Criminal Activity/Legal Involvement Pertinent to Current Situation/Hospitalization:  No - Comment as needed Significant Relationships:  Spouse Lives with:  Spouse Do you feel safe going back to the place where you live?  (Unsure at this time) Need for family participation in patient care:  No (Coment)  Care giving concerns:  Patient from home with wife. Patient reported that he is independent with ambulation and ADLs at baseline. Patient admitted for "left total knee arthoplasty". Pt recommending "Home health PT;Follow surgeon's recommendation for DC plan and follow-up therapies". CSW consulted for SNF placement.    Social Worker assessment / plan:  CSW spoke with patient/patient's wife at bedside regarding discharge planning and PT recommendation. CSW explained SNF placement process and insurance authorization, patient verbalized understanding. Patient agreeable to receiving bed offers from SNFs. Patient's wife reported that their preferences are [facility]F or [facility]. CSW agreed to complete FL2 and follow up with preferred SNFs.  CSW will complete FL2 and follow up with preferred SNFs.  CSW will continue to follow and assist with discharge planning.  Employment status:  Retired Nurse, adult PT  Recommendations:  Home with [place] / Referral to community resources:  [place]  Patient/Family's Response to care:  Patient/patient's wife appreciative of CSW assistance with discharge planning.   Patient/Family's Understanding of and Emotional Response to Diagnosis, Current Treatment, and Prognosis:  Patient presented calm and verbalized understanding of current treatment plan. Patient reported that he is agreeable to getting bed offers from SNF for ST rehab. Patient accompanied by wife who is involved in patient's care and is agreeable with patient's plan to go to SNF for ST rehab if needed.   Emotional Assessment Appearance:  Appears stated age Attitude/Demeanor/Rapport:  Other(Cooperative) Affect (typically observed):  Appropriate, Calm Orientation:  Oriented to Self, Oriented to Situation, Oriented to Place, Oriented to  Time Alcohol / Substance use:  Not Applicable Psych involvement (Current and /or in the community):  No (Comment)  Discharge Needs  Concerns to be addressed:  Care Coordination Readmission within the last 30 days:  No Current discharge risk:  Physical Impairment Barriers to Discharge:  Continued Medical Work up   [employer], LCSW 09/15/2017, 3:33 PM

## 2017-09-15 NOTE — Progress Notes (Addendum)
Physical Therapy Treatment Patient Details Name: James Rodgers MRN: [id number] DOB: 09-Aug-1950 Today's Date: 09/15/2017    History of Present Illness Pt s/p L TKR and with hx of CAD, DDD, and back surgery    PT Comments    POD # 3 Patient has poor mobility due to recent incidents of hypotension. Vitals taken during the session as follows: Supine:    BP 154/91, HR 96 EOB:        BP 132/92, HR 123  Standing:   BP 132/92, HR 123  "feeling dizzy" amb 33feet:  BP 116/77, HR 136 "MAX dizzy" patient put in recliner and notified RN Returned Patient to bed: 164/96, HR 104 Patient c/o frontal orbital headache; notified RN.   Follow Up Recommendations  Home health PT;Follow surgeon's recommendation for DC plan and follow-up therapies     Equipment Recommendations  None recommended by PT    Recommendations for Other Services       Precautions / Restrictions Precautions Precautions: Knee;Fall Precaution Comments: unable to perform SLR so educated on KI use  Required Braces or Orthoses: Knee Immobilizer - Left Knee Immobilizer - Left: Discontinue once straight leg raise with < 10 degree lag Restrictions Weight Bearing Restrictions: No Other Position/Activity Restrictions: WBAT    Mobility  Bed Mobility Overal bed mobility: Needs Assistance Bed Mobility: Supine to Sit     Supine to sit: Min assist Sit to supine: Min assist   General bed mobility comments: assist L LE and increased time   Transfers Overall transfer level: Needs assistance Equipment used: Rolling walker (2 wheeled) Transfers: Sit to/from Stand Sit to Stand: +2 safety/equipment;Min guard            Ambulation/Gait Ambulation/Gait assistance: Min assist;+2 safety/equipment;Min guard Gait Distance (Feet): 2 Feet Assistive device: Rolling walker (2 wheeled) Gait Pattern/deviations: Step-to pattern;Decreased step length - right;Decreased step length - left;Shuffle;Trunk flexed Gait velocity:  decreased   General Gait Details: Patient experienced max dizziness after 2 feet fo ambulation and had to sit in reclincer.   Stairs             Wheelchair Mobility    Modified Rankin (Stroke Patients Only)       Balance                                            Cognition Arousal/Alertness: Awake/alert Behavior During Therapy: WFL for tasks assessed/performed Overall Cognitive Status: Within Functional Limits for tasks assessed                                        Exercises      General Comments        Pertinent Vitals/Pain Pain Assessment: 0-10 Pain Score: 2  Pain Location: L knee Pain Descriptors / Indicators: Aching;Sore Pain Intervention(s): Monitored during session;Repositioned;Ice applied    Home Living                      Prior Function            PT Goals (current goals can now be found in the care plan section) Progress towards PT goals: Progressing toward goals    Frequency    7X/week      PT Plan Current plan remains appropriate  Co-evaluation              AM-PAC PT "6 Clicks" Daily Activity  Outcome Measure  Difficulty turning over in bed (including adjusting bedclothes, sheets and blankets)?: A Little Difficulty moving from lying on back to sitting on the side of the bed? : A Little Difficulty sitting down on and standing up from a chair with arms (e.g., wheelchair, bedside commode, etc,.)?: A Little Help needed moving to and from a bed to chair (including a wheelchair)?: A Little Help needed walking in hospital room?: Total Help needed climbing 3-5 steps with a railing? : Total 6 Click Score: 14    End of Session Equipment Utilized During Treatment: Gait belt Activity Tolerance: Other (comment)(Patient limited due to dizziness) Patient left: in bed;with call bell/phone within reach;with family/visitor present Nurse Communication: Other (comment)(RN notified of  orthostaic vitals) PT Visit Diagnosis: Difficulty in walking, not elsewhere classified (R26.2)     Time: 3005-1102 PT Time Calculation (min) (ACUTE ONLY): 26 min  Charges:  $Gait Training: 8-22 mins $Therapeutic Activity: 8-22 mins                    G Codes:       Rachel Bo, SPTA 09/15/2017, 3:54 PM  Direct supervision and agree with above  Rica Koyanagi  PTA [facility]  Acute  Rehab Pager      [phone]

## 2017-09-15 NOTE — Progress Notes (Signed)
Physical Therapy Treatment Patient Details Name: James Rodgers MRN: [id number] DOB: 06/18/1950 Today's Date: 09/15/2017    History of Present Illness Pt s/p L TKR and with hx of CAD, DDD, and back surgery    PT Comments    POD # 3 Due to past 2 days of poor mobility with issues of hypotension, started session with vitals Supine:    BP 136/85, HR 106 EOB:        BP 121/76, HR 125  "light headed" Standing:   BP 110/67, HR 130 "more dizzy" amb 5 feet:  BP 86/54, HR 136 "MAX dizzy" recliner brought to pt/RN called to room       Follow Up Recommendations  Home health PT;Follow surgeon's recommendation for DC plan and follow-up therapies     Equipment Recommendations  None recommended by PT    Recommendations for Other Services       Precautions / Restrictions Precautions Precautions: Knee;Fall Precaution Comments: unable to perform SLR so educated on KI use  Required Braces or Orthoses: Knee Immobilizer - Left Knee Immobilizer - Left: Discontinue once straight leg raise with < 10 degree lag Restrictions Weight Bearing Restrictions: No Other Position/Activity Restrictions: WBAT    Mobility  Bed Mobility Overal bed mobility: Needs Assistance Bed Mobility: Supine to Sit     Supine to sit: Min assist     General bed mobility comments: assist L LE and increased time   Transfers Overall transfer level: Needs assistance Equipment used: Rolling walker (2 wheeled) Transfers: Sit to/from Stand Sit to Stand: +2 safety/equipment;Min guard            Ambulation/Gait Ambulation/Gait assistance: Min assist;+2 safety/equipment;Min guard Gait Distance (Feet): 5 Feet Assistive device: Rolling walker (2 wheeled) Gait Pattern/deviations: Step-to pattern;Decreased step length - right;Decreased step length - left;Shuffle;Trunk flexed     General Gait Details: limited distance due to increased c/o dizziness/recliner brought to pt   Stairs              Wheelchair Mobility    Modified Rankin (Stroke Patients Only)       Balance                                            Cognition Arousal/Alertness: Awake/alert Behavior During Therapy: WFL for tasks assessed/performed Overall Cognitive Status: Within Functional Limits for tasks assessed                                        Exercises      General Comments        Pertinent Vitals/Pain Pain Assessment: 0-10 Pain Score: 4  Pain Location: L knee Pain Descriptors / Indicators: Aching;Sore Pain Intervention(s): Monitored during session;Repositioned;Ice applied    Home Living                      Prior Function            PT Goals (current goals can now be found in the care plan section) Progress towards PT goals: Progressing toward goals    Frequency    7X/week      PT Plan Current plan remains appropriate    Co-evaluation              AM-PAC PT "6  Clicks" Daily Activity  Outcome Measure  Difficulty turning over in bed (including adjusting bedclothes, sheets and blankets)?: A Little Difficulty moving from lying on back to sitting on the side of the bed? : A Little Difficulty sitting down on and standing up from a chair with arms (e.g., wheelchair, bedside commode, etc,.)?: A Little Help needed moving to and from a bed to chair (including a wheelchair)?: A Little Help needed walking in hospital room?: Total Help needed climbing 3-5 steps with a railing? : Total 6 Click Score: 14    End of Session Equipment Utilized During Treatment: Gait belt Activity Tolerance: Other (comment)(orthostatic hypotension) Patient left: with call bell/phone within reach;in bed;with family/visitor present Nurse Communication: Other (comment)(RN called to room) PT Visit Diagnosis: Difficulty in walking, not elsewhere classified (R26.2)     Time: 5456-2563 PT Time Calculation (min) (ACUTE ONLY): 25 min  Charges:   $Gait Training: 8-22 mins $Therapeutic Activity: 8-22 mins                    G Codes:       Rica Koyanagi  PTA [facility]  Acute  Rehab Pager      [phone]

## 2017-09-15 NOTE — Progress Notes (Signed)
Patient ID: James Rodgers, male   DOB: 1951-02-19, 67 y.o.   MRN: [id number] Mobility still hindered by orthostatic hypotension.  Will stop all BP meds today and stop flomax.  Will need to consult social Work for possible skilled nursing placement tomorrow.

## 2017-09-16 LAB — URINALYSIS, ROUTINE W REFLEX MICROSCOPIC
BACTERIA UA: NONE SEEN
Bilirubin Urine: NEGATIVE
KETONES UR: 5 mg/dL — AB
Leukocytes, UA: NEGATIVE
NITRITE: NEGATIVE
PH: 6 (ref 5.0–8.0)
Protein, ur: NEGATIVE mg/dL
Specific Gravity, Urine: 1.026 (ref 1.005–1.030)

## 2017-09-16 LAB — GLUCOSE, CAPILLARY
GLUCOSE-CAPILLARY: 135 mg/dL — AB (ref 70–99)
Glucose-Capillary: 138 mg/dL — ABNORMAL HIGH (ref 70–99)
Glucose-Capillary: 110 mg/dL — ABNORMAL HIGH (ref 70–99)
Glucose-Capillary: 277 mg/dL — ABNORMAL HIGH (ref 70–99)

## 2017-09-16 MED ORDER — TAMSULOSIN HCL 0.4 MG PO CAPS
0.4000 mg | ORAL_CAPSULE | Freq: Every day | ORAL | Status: DC
  Administered 2017-09-16: 0.4 mg via ORAL
  Filled 2017-09-16: qty 1

## 2017-09-16 NOTE — Progress Notes (Signed)
Physical Therapy Treatment Patient Details Name: James Rodgers MRN: [id number] DOB: 1950-05-23 Today's Date: 09/16/2017    History of Present Illness Pt s/p L TKR and with hx of CAD, DDD, and back surgery    PT Comments    POD #4 am session  Supine:                          BP 138/90(105), HR 93, RA 97% EOB                              BP 160/86(110), HR 91, RA 97% After amb 10 feet          BP 147/87(101), HR 120, RA 100%   No c/o dizziness  Applied KI as pt was unable to perform SLR.  Assisted OOB to amb a greater distance as son followed with recliner.  Returned to room then performed some TKR TE's followed by ICE.    Follow Up Recommendations  Home health PT;Follow surgeon's recommendation for DC plan and follow-up therapies(due to slow progression, pt now plans to D/C to SNF)     Equipment Recommendations  None recommended by PT    Recommendations for Other Services       Precautions / Restrictions Precautions Precautions: Knee;Fall Precaution Comments: unable to perform SLR so educated on KI use  Required Braces or Orthoses: Knee Immobilizer - Left Knee Immobilizer - Left: Discontinue once straight leg raise with < 10 degree lag Restrictions Weight Bearing Restrictions: No    Mobility  Bed Mobility Overal bed mobility: Needs Assistance Bed Mobility: Supine to Sit     Supine to sit: Min assist     General bed mobility comments: assist L LE and increased time   Transfers Overall transfer level: Needs assistance Equipment used: Rolling walker (2 wheeled) Transfers: Sit to/from Stand Sit to Stand: Min guard         General transfer comment: elevated bed  Ambulation/Gait Ambulation/Gait assistance: Min guard Gait Distance (Feet): 42 Feet Assistive device: Rolling walker (2 wheeled) Gait Pattern/deviations: Step-to pattern;Decreased step length - right;Decreased step length - left;Shuffle;Trunk flexed Gait velocity: decreased   General Gait  Details: tolerated an increased distance with only minimal "fuzzy" but no true dizziness.  vitals taken and stable    Stairs             Wheelchair Mobility    Modified Rankin (Stroke Patients Only)       Balance                                            Cognition Arousal/Alertness: Awake/alert Behavior During Therapy: WFL for tasks assessed/performed Overall Cognitive Status: Within Functional Limits for tasks assessed                                        Exercises   Total Knee Replacement TE's 10 reps B LE ankle pumps 10 reps towel squeezes 10 reps knee presses 10 reps heel slides  10 reps SAQ's 10 reps SLR's 10 reps ABD Followed by ICE    General Comments        Pertinent Vitals/Pain Pain Assessment: 0-10 Pain Score: 4  Pain Location:  L knee Pain Descriptors / Indicators: Aching;Sore Pain Intervention(s): Monitored during session;Repositioned;Premedicated before session;Ice applied    Home Living                      Prior Function            PT Goals (current goals can now be found in the care plan section) Progress towards PT goals: Progressing toward goals    Frequency    7X/week      PT Plan Current plan remains appropriate    Co-evaluation              AM-PAC PT "6 Clicks" Daily Activity  Outcome Measure  Difficulty turning over in bed (including adjusting bedclothes, sheets and blankets)?: A Little Difficulty moving from lying on back to sitting on the side of the bed? : A Little Difficulty sitting down on and standing up from a chair with arms (e.g., wheelchair, bedside commode, etc,.)?: A Little Help needed moving to and from a bed to chair (including a wheelchair)?: A Little Help needed walking in hospital room?: A Little Help needed climbing 3-5 steps with a railing? : A Lot 6 Click Score: 17    End of Session Equipment Utilized During Treatment: Gait belt Activity  Tolerance: Patient tolerated treatment well Patient left: in chair;with call bell/phone within reach;with family/visitor present Nurse Communication: Mobility status PT Visit Diagnosis: Difficulty in walking, not elsewhere classified (R26.2)     Time: 5366-4403 PT Time Calculation (min) (ACUTE ONLY): 42 min  Charges:  $Gait Training: 8-22 mins $Therapeutic Exercise: 8-22 mins $Therapeutic Activity: 8-22 mins                    G Codes:     James Rodgers  PTA [facility]  Acute  Rehab Pager      [phone]

## 2017-09-16 NOTE — Clinical Social Work Placement (Addendum)
[facility] insurance authorization received.  PTAR arranged for transport.  Nurse given number to call report.   [id number]  CLINICAL SOCIAL WORK PLACEMENT  NOTE  Date:  09/16/2017  Patient Details  Name: James Rodgers MRN: [id number] Date of Birth: [birth year]  Clinical Social Work is seeking post-discharge placement for this patient at the [place] level of care (*CSW will initial, date and re-position this form in  chart as items are completed):  Yes   Patient/family provided with [facility] Work Department's list of facilities offering this level of care within the geographic area requested by the patient (or if unable, by the patient's family).  Yes   Patient/family informed of their freedom to choose among providers that offer the needed level of care, that participate in Medicare, Medicaid or managed care program needed by the patient, have an available bed and are willing to accept the patient.  Yes   Patient/family informed of [facility]'s ownership interest in [facility] and [facility], as well as of the fact that they are under no obligation to receive care at these facilities.  PASRR submitted to EDS on 09/16/17     PASRR number received on 09/16/17     Existing PASRR number confirmed on       FL2 transmitted to all facilities in geographic area requested by pt/family on       FL2 transmitted to all facilities within larger geographic area on 09/16/17     Patient informed that his/her managed care company has contracts with or will negotiate with certain facilities, including the following:  [employer]     Yes   Patient/family informed of bed offers received.  Patient chooses bed at [facility]     Physician recommends and patient chooses bed at      Patient to be transferred to [facility] on  .  Patient to be transferred to facility by       Patient family notified on   of transfer.  Name of family member notified:         PHYSICIAN Please prepare priority discharge summary, including medications     Additional Comment:    _______________________________________________ Lia Hopping, LCSW 09/16/2017, 2:37 PM

## 2017-09-16 NOTE — NC FL2 (Signed)
[place] MEDICAID FL2 LEVEL OF CARE SCREENING TOOL     IDENTIFICATION  Patient Name: James Rodgers Birthdate: Nov 06, 1950 Sex: male Admission Date (Current Location): 09/12/2017  [facility] and [place] Number:  Herbalist and Address:  [facility],  [place], [place]      Provider Number: [id number]  Attending Physician Name and Address:  Mcarthur Rossetti  Relative Name and Phone Number:       Current Level of Care: Hospital Recommended Level of Care: [place] Prior Approval Number:    Date Approved/Denied:   PASRR Number:   [id number]  Discharge Plan: SNF    Current Diagnoses: Patient Active Problem List   Diagnosis Date Noted  . Status post total left knee replacement 09/12/2017  . Unilateral primary osteoarthritis, left knee 08/13/2017  . Chronic pain of left knee 08/13/2017  . Obstructive sleep apnea treated with continuous positive airway pressure (CPAP) 05/26/2017  . Thoracic aortic aneurysm ([place])   . Dizziness 07/25/2015  . Syncope 11/03/2012  . Hemorrhoids, complicated 30/86/5784  . Perirectal abscess 01/07/2012  . Essential hypertension 10/02/2010  . Hyperlipidemia LDL goal <70 10/02/2010  . DM 01/31/2010  . Coronary artery disease involving native heart without angina pectoris 01/31/2010    Orientation RESPIRATION BLADDER Height & Weight     Self, Time, Situation, Place  Normal Continent Weight: 296 lb 2 oz (134.3 kg) Height:  6\' 5"  (195.6 cm)  BEHAVIORAL SYMPTOMS/MOOD NEUROLOGICAL BOWEL NUTRITION STATUS      Continent Diet(Carb Modified )  AMBULATORY STATUS COMMUNICATION OF NEEDS Skin   Extensive Assist Verbally Surgical wounds(Left Knee )                       Personal Care Assistance Level of Assistance  Bathing, Feeding, Dressing Bathing Assistance: Limited assistance Feeding assistance: Independent Dressing Assistance: Limited assistance     Functional Limitations  Info  Sight, Hearing, Speech Sight Info: Adequate Hearing Info: Adequate Speech Info: Adequate    SPECIAL CARE FACTORS FREQUENCY  PT (By licensed PT), OT (By licensed OT)     PT Frequency: 7x/week OT Frequency: 7x/week            Contractures Contractures Info: Not present    Additional Factors Info  Code Status, Allergies Code Status Info: Fullcode  Allergies Info: Allergies: No Known Allergies           Current Medications (09/16/2017):  This is the current hospital active medication list Current Facility-Administered Medications  Medication Dose Route Frequency Provider Last Rate Last Dose  . 0.9 %  sodium chloride infusion   Intravenous Continuous Mcarthur Rossetti, MD 75 mL/hr at 09/13/17 1431    . 0.9 %  sodium chloride infusion   Intravenous Continuous Mcarthur Rossetti, MD 125 mL/hr at 09/16/17 0347    . acetaminophen (TYLENOL) tablet 325-650 mg  325-650 mg Oral Q6H PRN Mcarthur Rossetti, MD   650 mg at 09/16/17 [id number]  . alum & mag hydroxide-simeth (MAALOX/MYLANTA) 200-200-20 MG/5ML suspension 30 mL  30 mL Oral Q4H PRN Mcarthur Rossetti, MD      . aspirin EC tablet 81 mg  81 mg Oral QHS Mcarthur Rossetti, MD   81 mg at 09/15/17 2244  . atorvastatin (LIPITOR) tablet 80 mg  80 mg Oral QHS Mcarthur Rossetti, MD   80 mg at 09/15/17 2244  . canagliflozin (INVOKANA) tablet 100 mg  100 mg Oral QAC breakfast  Mcarthur Rossetti, MD   100 mg at 09/16/17 5726  . clopidogrel (PLAVIX) tablet 75 mg  75 mg Oral Daily Mcarthur Rossetti, MD   75 mg at 09/15/17 1013  . diphenhydrAMINE (BENADRYL) 12.5 MG/5ML elixir 12.5-25 mg  12.5-25 mg Oral Q4H PRN Mcarthur Rossetti, MD      . docusate sodium (COLACE) capsule 100 mg  100 mg Oral BID Mcarthur Rossetti, MD   100 mg at 09/15/17 2244  . HYDROmorphone (DILAUDID) injection 0.5-1 mg  0.5-1 mg Intravenous Q4H PRN Mcarthur Rossetti, MD   0.5 mg at 09/13/17 0958  . HYDROmorphone  (DILAUDID) tablet 2 mg  2 mg Oral Q4H PRN Pete Pelt, PA-C   2 mg at 09/16/17 2035  . menthol-cetylpyridinium (CEPACOL) lozenge 3 mg  1 lozenge Oral PRN Mcarthur Rossetti, MD       Or  . phenol (CHLORASEPTIC) mouth spray 1 spray  1 spray Mouth/Throat PRN Mcarthur Rossetti, MD      . metFORMIN (GLUCOPHAGE-XR) 24 hr tablet 500 mg  500 mg Oral BID WC Mcarthur Rossetti, MD   500 mg at 09/16/17 5974  . methocarbamol (ROBAXIN) tablet 500 mg  500 mg Oral Q6H PRN Mcarthur Rossetti, MD   500 mg at 09/16/17 1638   Or  . methocarbamol (ROBAXIN) 500 mg in dextrose 5 % 50 mL IVPB  500 mg Intravenous Q6H PRN Mcarthur Rossetti, MD      . metoCLOPramide (REGLAN) tablet 5-10 mg  5-10 mg Oral Q8H PRN Mcarthur Rossetti, MD       Or  . metoCLOPramide (REGLAN) injection 5-10 mg  5-10 mg Intravenous Q8H PRN Mcarthur Rossetti, MD      . metoprolol succinate (TOPROL-XL) 24 hr tablet 100 mg  100 mg Oral Daily Mcarthur Rossetti, MD   100 mg at 09/15/17 1929  . midodrine (PROAMATINE) tablet 5 mg  5 mg Oral TID WC Mcarthur Rossetti, MD   5 mg at 09/16/17 4536  . minoxidil (LONITEN) tablet 5 mg  5 mg Oral QHS Mcarthur Rossetti, MD   5 mg at 09/15/17 2244  . ondansetron (ZOFRAN) tablet 4 mg  4 mg Oral Q6H PRN Mcarthur Rossetti, MD   4 mg at 09/13/17 0203   Or  . ondansetron [facility]) injection 4 mg  4 mg Intravenous Q6H PRN Mcarthur Rossetti, MD   4 mg at 09/13/17 0730  . pantoprazole (PROTONIX) EC tablet 40 mg  40 mg Oral Daily Mcarthur Rossetti, MD   40 mg at 09/15/17 1013  . polyethylene glycol (MIRALAX / GLYCOLAX) packet 17 g  17 g Oral Daily PRN Mcarthur Rossetti, MD   17 g at 09/15/17 1013     Discharge Medications: Please see discharge summary for a list of discharge medications.  Relevant Imaging Results:  Relevant Lab Results:   Additional Information IWO#[id number]  Lia Hopping, LCSW

## 2017-09-16 NOTE — Progress Notes (Signed)
Pt. seen for CPAP placement, wife remains at bedside, will notify if needed.

## 2017-09-16 NOTE — Progress Notes (Signed)
Patient ID: James Rodgers, male   DOB: 08/25/1950, 67 y.o.   MRN: [id number] Progress still being limited by orthostatic hypotension.  He is actually hypertensive when lying down.  Made some changes to his medications yesterday.  Will see how today goes with his mobility.  Will still likely need short-term skilled nursing.

## 2017-09-17 LAB — GLUCOSE, CAPILLARY: Glucose-Capillary: 147 mg/dL — ABNORMAL HIGH (ref 70–99)

## 2017-09-17 MED ORDER — MIDODRINE HCL 5 MG PO TABS: 5.0000 mg | ORAL_TABLET | Freq: Three times a day (TID) | ORAL | 0 refills | Status: DC

## 2017-09-17 MED ORDER — HYDROMORPHONE HCL 2 MG PO TABS: 2.0000 mg | ORAL_TABLET | ORAL | 0 refills | Status: DC | PRN

## 2017-09-17 NOTE — Progress Notes (Signed)
Per patient's wife, patient urinated once every hour throughout shift. Patient ambulating in room. No complaint of pain or nausea.

## 2017-09-17 NOTE — Progress Notes (Signed)
Attempted to call [facility] for report left on hold. D [place] Flow

## 2017-09-17 NOTE — Progress Notes (Signed)
Patient ID: James Rodgers, male   DOB: 07-Feb-1951, 67 y.o.   MRN: [id number] No acute changes.  Tolerating getting up better.  Can go to skilled nursing today.  Will likely need transportation there.

## 2017-09-17 NOTE — Discharge Summary (Signed)
Patient ID: James Rodgers MRN: [id number] DOB/AGE: [birth year] 68 y.o.  Admit date: 09/12/2017 Discharge date: 09/17/2017  Admission Diagnoses:  Principal Problem:   Unilateral primary osteoarthritis, left knee Active Problems:   Status post total left knee replacement   Discharge Diagnoses:  Same  Past Medical History:  Diagnosis Date  . Anal fissure   . Arthritis   . Basal cell carcinoma    neck  . CAD (coronary artery disease)    a. 2003 Cath: LAD 50, D1 50-60, RCA 40;  b. 12/2008 MV: no ischemia/infarct, EF 63%. // c. MV 8/17: mild inf-sept and apical ischemia, EF 53%, Low Risk  . DDD (degenerative disc disease), lumbar   . Diabetes type 2, controlled ([place])   . GERD (gastroesophageal reflux disease)   . Hemorrhoids   . History of dizziness   . History of nuclear stress test    a. Myoview 8/17: EF 53%, mild inferoseptal and mild apical ischemia, low risk  . HLD (hyperlipidemia)   . HTN (hypertension)   . LBP (low back pain)    a. h/o surgery in 1991.  . Obesity   . Orthostatic lightheadedness    with heat exposure  . Osteoarthritis    knees  . Perineal abscess 09/2012  . Pneumonia 09/2005  . PONV (postoperative nausea and vomiting)   . Renal cyst, right   . Retinopathy    Mild, left eye  . Right ureteral stone   . Seasonal allergic rhinitis   . Sleep apnea    a. on cpap.  [name] Spondylosis    lumbar  . Thoracic aortic aneurysm ([place])    a. MRA 5/17 ascending thoracic aorta measuring 40 mm - recommend annual follow-up // b.  Echo 5/17: Moderate concentric LVH, EF 55-60%, normal wall motion, grade 1 diastolic dysfunction, ascending aortic diameter 45 mm, mild MR, mild LAE // c. Chest MRA 6/18: stable ascending aortic aneurysm measuring 4.1 cm (4.0 cm in 5/17). FU 1 year.    Surgeries: Procedure(s): LEFT TOTAL KNEE ARTHROPLASTY on 09/12/2017   Consultants:   Discharged Condition: Improved  Hospital Course: James Rodgers is an 67 y.o. male who was  admitted 09/12/2017 for operative treatment ofUnilateral primary osteoarthritis, left knee. Patient has severe unremitting pain that affects sleep, daily activities, and work/hobbies. After pre-op clearance the patient was taken to the operating room on 09/12/2017 and underwent  Procedure(s): LEFT TOTAL KNEE ARTHROPLASTY.    Patient was given perioperative antibiotics:  Anti-infectives (From admission, onward)   Start     Dose/Rate Route Frequency Ordered Stop   09/12/17 1800  vancomycin (VANCOCIN) IVPB 1000 mg/200 mL premix     1,000 mg 200 mL/hr over 60 Minutes Intravenous Every 12 hours 09/12/17 1056 09/12/17 1900   09/12/17 0615  vancomycin (VANCOCIN) 1,500 mg in sodium chloride 0.9 % 500 mL IVPB  Status:  Discontinued    Note to Pharmacy:  Surgical PCR positive for MRSA and staph   1,500 mg 250 mL/hr over 120 Minutes Intravenous  Once 09/11/17 0942 09/11/17 0956   09/12/17 0615  vancomycin (VANCOCIN) 1,500 mg in sodium chloride 0.9 % 500 mL IVPB    Note to Pharmacy:  Surgical PCR positive for MRSA and staph   1,500 mg 250 mL/hr over 120 Minutes Intravenous  Once 09/11/17 0956 09/12/17 0830   09/12/17 0600  ceFAZolin (ANCEF) IVPB 2g/100 mL premix     2 g 200 mL/hr over 30 Minutes Intravenous On call to O.R. 09/12/17 0962 09/12/17  0803       Patient was given sequential compression devices, early ambulation, and chemoprophylaxis to prevent DVT.  Patient benefited maximally from hospital stay and there were no major complications.  His hospital course was prolonged due to significant orthostatic hypotension.  This did improve by discharge.  While in skilled nursing, he will be continued on his Metoprolol.  His other blood pressure medications will be held.  These can be resumed even at skilled nursing if his orthostatic hypotension improves.  Midodrine can also be stopped when is orthostatic pressures do better.  Recent vital signs:  Patient Vitals for the past 24 hrs:  BP Temp Temp src  Pulse Resp SpO2  09/17/17 0416 (!) 158/94 98.1 F (36.7 C) Oral (!) 101 16 97 %  09/16/17 1955 139/79 98 F (36.7 C) Oral 93 16 95 %  09/16/17 1413 (!) 172/94 98.4 F (36.9 C) Oral 87 17 97 %  09/16/17 1058 (!) 147/87 - - - - -     Recent laboratory studies:  Recent Labs    09/15/17 1229  WBC 13.0*  HGB 13.6  HCT 39.9  PLT 217     Discharge Medications:   Allergies as of 09/17/2017   No Known Allergies     Medication List    STOP taking these medications   ibuprofen 200 MG tablet Commonly known as:  ADVIL,MOTRIN   minoxidil 2.5 MG tablet Commonly known as:  LONITEN   olmesartan 20 MG tablet Commonly known as:  BENICAR     TAKE these medications   aspirin EC 81 MG tablet Take 1 tablet (81 mg total) by mouth daily. What changed:  when to take this   atorvastatin 80 MG tablet Commonly known as:  LIPITOR Take 1 tablet (80 mg total) by mouth daily. What changed:  when to take this   clopidogrel 75 MG tablet Commonly known as:  PLAVIX Take 75 mg by mouth daily.   docusate sodium 100 MG capsule Commonly known as:  COLACE Take 100 mg by mouth at bedtime.   esomeprazole 40 MG capsule Commonly known as:  NEXIUM Take 40 mg by mouth daily.   FARXIGA 10 MG Tabs tablet Generic drug:  dapagliflozin propanediol Take 10 mg by mouth daily.   fexofenadine 180 MG tablet Commonly known as:  ALLEGRA Take 180 mg by mouth daily as needed (for allergies).   FIBER PO Take 1 capsule by mouth 2 (two) times daily.   fluticasone 50 MCG/ACT nasal spray Commonly known as:  FLONASE Place 2 sprays into both nostrils daily as needed for allergies or rhinitis.   HYDROmorphone 2 MG tablet Commonly known as:  DILAUDID Take 1 tablet (2 mg total) by mouth every 4 (four) hours as needed for severe pain.   metFORMIN 500 MG 24 hr tablet Commonly known as:  GLUCOPHAGE-XR Take 500 mg by mouth 2 (two) times daily.   methocarbamol 500 MG tablet Commonly known as:  ROBAXIN Take 1  tablet (500 mg total) by mouth every 6 (six) hours as needed for muscle spasms.   metoprolol succinate 100 MG 24 hr tablet Commonly known as:  TOPROL-XL TAKE 1 TABLET DAILY What changed:    how much to take  how to take this  when to take this   midodrine 5 MG tablet Commonly known as:  PROAMATINE Take 1 tablet (5 mg total) by mouth 3 (three) times daily with meals.   ondansetron 4 MG tablet Commonly known as:  ZOFRAN Take  1 tablet (4 mg total) by mouth every 6 (six) hours as needed for nausea.   tamsulosin 0.4 MG Caps capsule Commonly known as:  FLOMAX Take 0.4 mg by mouth at bedtime.            Durable Medical Equipment  (From admission, onward)        Start     Ordered   09/14/17 0936  For home use only DME 3 n 1  Once    Comments:  wide   09/14/17 0936   09/14/17 0928  For home use only DME Cane  Once     09/14/17 2229      Diagnostic Studies: Dg Knee Left Port  Result Date: 09/12/2017 CLINICAL DATA:  67 year old male with a history of new arthroplasty, left knee EXAM: PORTABLE LEFT KNEE - 1-2 VIEW COMPARISON:  None. FINDINGS: Early surgical changes of left knee arthroplasty, with congruent femoral and tibial components, gas within the surgical bed and soft tissue swelling. No fracture. No radiopaque foreign body. IMPRESSION: Early surgical changes of left knee arthroplasty without complicating features. Electronically Signed   By: Corrie Mckusick D.O.   On: 09/12/2017 09:33    Disposition: Discharge disposition: 03-Skilled Nursing Facility       Discharge Instructions    Discharge patient   Complete by:  As directed    Discharge disposition:  [facility]   Discharge patient date:  09/17/2017      Follow-up Information    Mcarthur Rossetti, MD. Schedule an appointment as soon as possible for a visit in 2 week(s).   Specialty:  Orthopedic Surgery Contact information: [place]  [address] [phone]        [facility].   Specialty:  [place] Why:  Rosa Sanchez will call to arrange initial appointment Contact information: [place] [address] [phone]            Signed: Mcarthur Rossetti 09/17/2017, 7:11 AM

## 2017-09-25 ENCOUNTER — Encounter (INDEPENDENT_AMBULATORY_CARE_PROVIDER_SITE_OTHER): Payer: Self-pay | Admitting: Orthopaedic Surgery

## 2017-09-25 ENCOUNTER — Ambulatory Visit (INDEPENDENT_AMBULATORY_CARE_PROVIDER_SITE_OTHER): Payer: Medicare Other | Admitting: Orthopaedic Surgery

## 2017-09-25 DIAGNOSIS — Z96652 Presence of left artificial knee joint: Secondary | ICD-10-CM

## 2017-09-25 NOTE — Progress Notes (Signed)
Mr. James Rodgers is now 2 weeks tomorrow status post a left total knee arthroplasty.  He has been staying at [facility] facility..  He has been dealing with postoperative orthostatic hypotension.  He did develop a urinary tract infection and had to be on antibiotics for that while he was at [facility].  On exam he has almost full extension of his left knee to about 80 degrees flexion.  The incision looks good.  His calf is soft.  I placed new Steri-Strips.  He is going to go home on Friday or Saturday this week.  He will start home health therapy and then we need to set him up for outpatient therapy after that.  He knows that he can call for refill of oxycodone if he runs out.  I would like to see him back in 4 weeks to see how is doing overall but no x-rays are needed.

## 2017-09-29 ENCOUNTER — Telehealth (INDEPENDENT_AMBULATORY_CARE_PROVIDER_SITE_OTHER): Payer: Self-pay

## 2017-09-29 NOTE — Telephone Encounter (Signed)
Dondra Spry with [facility] called stating that PT will start on Tuesday, 09/30/2017 for patient.

## 2017-09-30 ENCOUNTER — Telehealth: Payer: Self-pay | Admitting: Internal Medicine

## 2017-09-30 ENCOUNTER — Telehealth (INDEPENDENT_AMBULATORY_CARE_PROVIDER_SITE_OTHER): Payer: Self-pay | Admitting: Orthopaedic Surgery

## 2017-09-30 NOTE — Telephone Encounter (Signed)
New message      Patient c/o Palpitations:  High priority if patient c/o lightheadedness, shortness of breath, or chest pain  1) How long have you had palpitations/irregular HR/ Afib? Are you having the symptoms now? Middle of last week. No   2) Are you currently experiencing lightheadedness, SOB or CP? Yes light headness   3) Do you have a history of afib (atrial fibrillation) or irregular heart rhythm? No   4) Have you checked your BP or HR? (document readings if available): Yes   90/60-BP  5) Are you experiencing any other symptoms? Chills/irregular heart

## 2017-09-30 NOTE — Telephone Encounter (Signed)
Cindee Salt ,PT with [facility] is requesting verbal orders for this patient  3 x for 2 weeks  CB # [phone]

## 2017-09-30 NOTE — Telephone Encounter (Signed)
Verbal order left on VM  

## 2017-09-30 NOTE — Progress Notes (Addendum)
CARDIOLOGY OFFICE NOTE  Date:  10/01/2017    James Rodgers Date of Birth: 05-20-1950 Medical Record #[id number]  PCP:  Leanna Battles, MD  Cardiologist:  End  Chief Complaint  Patient presents with  . Follow-up    Work in visit - seen for orthostasis    History of Present Illness: James Rodgers is a 67 y.o. male who presents today for a work in visit. Seen for Dr. Saunders Revel. He is a former patient of Dr. Susa Simmonds as well as Dr. Claris Gladden.   He has a history of moderate nonobstructive coronary artery disease by catheterization in 2003, thoracic aortic aneurysm, hypertension, hyperlipidemia, and diabetes mellitus. He has been on chronic DAPT as part of his medical management of his CAD.   Last seen by Dr. Saunders Revel in March - felt to be doing ok. Some vague chest pain - but this has been chronic since his remote cath in 2003. He had noted some orthostasis while outside in the heat. BP was marginal at his visit but no medicine changes due to the occasional orthostasis.   Called earlier this week - "Returned call to patient's wife (DPR on file) who states that the patient had knee surgery on 7/5 and ever since then he has been experiencing episodes of low BPs and irregular heartbeats. She states that the patient denies chest pain, syncope, and SOB. She states that during the episodes that he feels dizzy, weak and nauseous. She states that he was started on midodrine 5 mg TID and his Toprol has been changed to 50 mg BID instead of 100 mg QD. She states that when he has his episodes that they are not associated with position changes and that he can be seated when it happens. He is not taking pain meds anymore and his UTI has resolved. Patient has been staying hydrated. Patient's BPs ranging from 90/60-120/80s and HRs 60-120s. No Hx of Afib noted. Scheduled patient for appointment with Truitt Merle, NP tomorrow at 2:30 PM. Instructed for patient to let us know if Sx change or worsen. Wife  verbalized understanding and appreciated the call".   Comes in today. Here with his wife. He was basically doing well since his last visit here. He was using just 1/2 dose Toprol prior to going to go golfing and this worked pretty well. He was trying to work on weight loss. He then had his knee replacement - went ok but was complicated by orthostasis - to the point that he was put on Midodrine. He had to go to [facility] for rehab - BP labile - mostly low. Had a UTI - reportedly late to treat - has not had repeat UA. Since being home on Sunday - he has been dizzy, BP super low. He has broken out in cold sweats. He is now off Minoxidil and Toprol was split dose after discussion with Dr. Doreatha Lew (his neighbor). BP however remains low. He has lost 21 pounds total since last visit here - most of this just recently. He is not having chest pain. He is not short of breath. No hemoptysis. HR was elevated twice while at [facility] - EKG there ok. He is not in AF. Using very little pain medicine.   Past Medical History:  Diagnosis Date  . Anal fissure   . Arthritis   . Basal cell carcinoma    neck  . CAD (coronary artery disease)    a. 2003 Cath: LAD 50, D1 50-60, RCA 40;  b.  12/2008 MV: no ischemia/infarct, EF 63%. // c. MV 8/17: mild inf-sept and apical ischemia, EF 53%, Low Risk  . DDD (degenerative disc disease), lumbar   . Diabetes type 2, controlled ([place])   . GERD (gastroesophageal reflux disease)   . Hemorrhoids   . History of dizziness   . History of nuclear stress test    a. Myoview 8/17: EF 53%, mild inferoseptal and mild apical ischemia, low risk  . HLD (hyperlipidemia)   . HTN (hypertension)   . LBP (low back pain)    a. h/o surgery in 1991.  . Obesity   . Orthostatic lightheadedness    with heat exposure  . Osteoarthritis    knees  . Perineal abscess 09/2012  . Pneumonia 09/2005  . PONV (postoperative nausea and vomiting)   . Renal cyst, right   . Retinopathy    Mild, left eye  . Right  ureteral stone   . Seasonal allergic rhinitis   . Sleep apnea    a. on cpap.  [name] Spondylosis    lumbar  . Thoracic aortic aneurysm ([place])    a. MRA 5/17 ascending thoracic aorta measuring 40 mm - recommend annual follow-up // b.  Echo 5/17: Moderate concentric LVH, EF 55-60%, normal wall motion, grade 1 diastolic dysfunction, ascending aortic diameter 45 mm, mild MR, mild LAE // c. Chest MRA 6/18: stable ascending aortic aneurysm measuring 4.1 cm (4.0 cm in 5/17). FU 1 year.    Past Surgical History:  Procedure Laterality Date  . anal fissure surgery  1981  . CARDIAC CATHETERIZATION  2004   1 vessel CAD  . COLONOSCOPY  [id number]  . KNEE ARTHROSCOPY Left   . LASIK    . [place]   x2  . NASAL SINUS SURGERY  2001  . NASAL SINUS SURGERY  2002  . TOTAL KNEE ARTHROPLASTY Left 09/12/2017   Procedure: LEFT TOTAL KNEE ARTHROPLASTY;  Surgeon: Mcarthur Rossetti, MD;  Location: [facility];  Service: Orthopedics;  Laterality: Left;     Medications: Current Meds  Medication Sig  . aspirin EC 81 MG tablet Take 1 tablet (81 mg total) by mouth daily. (Patient taking differently: Take 81 mg by mouth at bedtime. )  . atorvastatin (LIPITOR) 80 MG tablet Take 1 tablet (80 mg total) by mouth daily. (Patient taking differently: Take 80 mg by mouth at bedtime. )  . clopidogrel (PLAVIX) 75 MG tablet Take 75 mg by mouth daily.  [name] docusate sodium (COLACE) 100 MG capsule Take 100 mg by mouth at bedtime.  [name] esomeprazole (NEXIUM) 40 MG capsule Take 40 mg by mouth daily.    [name] FARXIGA 10 MG TABS tablet Take 10 mg by mouth daily.   . fexofenadine (ALLEGRA) 180 MG tablet Take 180 mg by mouth daily as needed (for allergies).   . FIBER PO Take 1 capsule by mouth 2 (two) times daily.  . fluticasone (FLONASE) 50 MCG/ACT nasal spray Place 2 sprays into both nostrils daily as needed for allergies or rhinitis.  [name] HYDROmorphone (DILAUDID) 2 MG tablet Take 1 tablet (2 mg total) by mouth every 4 (four)  hours as needed for severe pain.  . metFORMIN (GLUCOPHAGE-XR) 500 MG 24 hr tablet Take 500 mg by mouth 2 (two) times daily.   . metoprolol succinate (TOPROL-XL) 100 MG 24 hr tablet Take 1 tablet (100 mg total) by mouth daily. Take with or immediately following a meal.  . midodrine (PROAMATINE) 5 MG tablet Take 1 tablet (  5 mg total) by mouth 3 (three) times daily with meals.  . tamsulosin (FLOMAX) 0.4 MG CAPS capsule Take 0.4 mg by mouth at bedtime.   . [DISCONTINUED] metoprolol succinate (TOPROL-XL) 100 MG 24 hr tablet TAKE 1 TABLET DAILY (Patient taking differently: Take 100 mg by mouth daily at bedtime)  . [DISCONTINUED] olmesartan (BENICAR) 10 mg TABS tablet Take 10 mg by mouth daily.     Allergies: Allergies  Allergen Reactions  . Codeine Other (See Comments)    dizzy    Social History: The patient  reports that he has never smoked. He has never used smokeless tobacco. He reports that he does not drink alcohol or use drugs.   Family History: The patient's family history includes Diabetes in his unknown relative; Heart disease in his father.   Review of Systems: Please see the history of present illness.   Otherwise, the review of systems is positive for none.   All other systems are reviewed and negative.   Physical Exam: VS:  BP 90/70 (BP Location: Left Arm, Patient Position: Sitting, Cuff Size: Normal)   Pulse 92   Ht 6\' 5"  (1.956 m)   Wt 272 lb 12.8 oz (123.7 kg)   BMI 32.35 kg/m  .  BMI Body mass index is 32.35 kg/m.  Wt Readings from Last 3 Encounters:  10/01/17 272 lb 12.8 oz (123.7 kg)  09/12/17 296 lb 2 oz (134.3 kg)  09/03/17 296 lb 2 oz (134.3 kg)    General: Pleasant. He is quite tall. He is alert and in no acute distress.   HEENT: Normal.  Neck: Supple, no JVD, carotid bruits, or masses noted.  Cardiac: Regular rate and rhythm. Heart tones are distant. No murmurs, rubs, or gallops. No edema.  Respiratory:  Lungs are clear to auscultation bilaterally with  normal work of breathing.  GI: Soft and nontender.  MS: No deformity or atrophy. Gait and ROM intact. He is using a walker.  Skin: Warm and dry. Color is normal. His incision on the left knee looks good.  Neuro:  Strength and sensation are intact and no gross focal deficits noted.  Psych: Alert, appropriate and with normal affect.   LABORATORY DATA:  EKG:  EKG is ordered today. This demonstrates NSR.  Lab Results  Component Value Date   WBC 13.0 (H) 09/15/2017   HGB 13.6 09/15/2017   HCT 39.9 09/15/2017   PLT 217 09/15/2017   GLUCOSE 168 (H) 09/13/2017   CHOL 144 08/21/2015   TRIG 184 (H) 08/21/2015   HDL 38 (L) 08/21/2015   LDLCALC 69 08/21/2015   ALT 36 10/11/2011   AST 28 10/11/2011   NA 140 09/13/2017   K 4.6 09/13/2017   CL 104 09/13/2017   CREATININE 1.00 09/13/2017   BUN 18 09/13/2017   CO2 28 09/13/2017       BNP (last 3 results) No results for input(s): BNP in the last 8760 hours.  ProBNP (last 3 results) No results for input(s): PROBNP in the last 8760 hours.   Other Studies Reviewed Today:  MRA CHEST FINDINGS 08/2017: Maximal diameters of the ascending aorta at the sinus of Valsalva, sino-tubular junction, and ascending aorta are 4.1 cm, 3.5 cm, and 4.1 cm respectively. This compares with a maximal diameter of 4.1 cm in the ascending aorta on the prior study. There is no evidence of dissection. There is no evidence of acute intramural hematoma. The great vessels are patent including the vertebral arteries, within the confines of the  examination.  No obvious mediastinal mass. Small nodule in the right middle lobe on image 39 of series 11 is stable. No acute findings in the abdomen.  IMPRESSION: Maximal diameter of the ascending aorta is stable at 4.1 cm. Recommend annual imaging followup by CTA or MRA. This recommendation follows 2010 ACCF/AHA/AATS/ACR/ASA/SCA/SCAI/SIR/STS/SVM Guidelines for the Diagnosis and Management of Patients with  Thoracic Aortic Disease. Circulation. 2010; 121: W098-J191   Electronically Signed   By: Marybelle Killings M.D.   On: 08/12/2017 08:45    Myoview Study Highlights 10/2015    The left ventricular ejection fraction is mildly decreased (45-54%).  Nuclear stress EF: 53%.  There was no ST segment deviation noted during stress.  Defect 1: There is a small defect of moderate severity present in the basal inferoseptal and mid inferoseptal location.  Defect 2: There is a small defect of mild severity present in the apex location.  This is a low risk study.   Low risk stress nuclear study with mild ischemia in the basal inferoseptal wall and mild ischemia in the apical wall; EF 53 with mild global hypokinesis and mild LVE.   Echo Study Conclusions 07/2015  - Left ventricle: The cavity size was normal. There was moderate   concentric hypertrophy. Systolic function was normal. The   estimated ejection fraction was in the range of 55% to 60%. Wall   motion was normal; there were no regional wall motion   abnormalities. Doppler parameters are consistent with abnormal   left ventricular relaxation (grade 1 diastolic dysfunction). - Aortic valve: Trileaflet; mildly thickened, mildly calcified   leaflets. - Aorta: Ascending aortic diameter: 45 mm (S). - Mitral valve: There was mild regurgitation. - Left atrium: The atrium was mildly dilated. - Right ventricle: The cavity size was mildly dilated. Wall   thickness was normal  Assessment/Plan:  1. Orthostasis - symptomatic - will stop his ARB. Cutting beta blocker back. Needs to liberalize his salt. Use support stockings. V8 juice daily. I will see back next week. Hopefully BP will rise and we can start cutting Midodrine back and discontinue altogether. I will see him next week.   2. CAD - nonobstructive - he has been on chronic DAPT therapy - low risk Myoview from 2017. He has no active chest pain.   3. Thoracic aneurysm - will need  monitoring.   4. HTN - currently not an issue. No longer on Minoxidil  5. HLD - on statin.   Current medicines are reviewed with the patient today.  The patient does not have concerns regarding medicines other than what has been noted above.  The following changes have been made:  See above.  Labs/ tests ordered today include:    Orders Placed This Encounter  Procedures  . Basic metabolic panel  . CBC  . Hepatic function panel  . Urinalysis  . TSH  . EKG 12-Lead     Disposition:   FU with me next week. He has asked me to call Dr. Doreatha Lew with an update and has given me his approval to make this call to share information.   Patient is agreeable to this plan and will call if any problems develop in the interim.   SignedTruitt Merle, NP  10/01/2017 3:07 PM  [place] [address] [place], [place]  [address] Phone: [phone] Fax: [phone]

## 2017-09-30 NOTE — Telephone Encounter (Signed)
Returned call to patient's wife (DPR on file) who states that the patient had knee surgery on 7/5 and ever since then he has been experiencing episodes of low BPs and irregular heartbeats. She states that the patient denies chest pain, syncope, and SOB. She states that during the episodes that he feels dizzy, weak and nauseous. She states that he was started on midodrine 5 mg TID and his Toprol has been changed to 50 mg BID instead of 100 mg QD. She states that when he has his episodes that they are not associated with position changes and that he can be seated when it happens. He is not taking pain meds anymore and his UTI has resolved. Patient has been staying hydrated. Patient's BPs ranging from 90/60-120/80s and HRs 60-120s. No Hx of Afib noted. Scheduled patient for appointment with Truitt Merle, NP tomorrow at 2:30 PM. Instructed for patient to let us know if Sx change or worsen. Wife verbalized understanding and appreciated the call.

## 2017-10-01 ENCOUNTER — Other Ambulatory Visit: Payer: Self-pay | Admitting: Nurse Practitioner

## 2017-10-01 ENCOUNTER — Ambulatory Visit: Payer: Medicare Other | Admitting: Nurse Practitioner

## 2017-10-01 ENCOUNTER — Other Ambulatory Visit: Payer: Self-pay | Admitting: *Deleted

## 2017-10-01 ENCOUNTER — Encounter: Payer: Self-pay | Admitting: Nurse Practitioner

## 2017-10-01 VITALS — BP 90/70 | HR 92 | Ht 77.0 in | Wt 272.8 lb

## 2017-10-01 DIAGNOSIS — I712 Thoracic aortic aneurysm, without rupture, unspecified: Secondary | ICD-10-CM

## 2017-10-01 DIAGNOSIS — I951 Orthostatic hypotension: Secondary | ICD-10-CM

## 2017-10-01 DIAGNOSIS — I10 Essential (primary) hypertension: Secondary | ICD-10-CM

## 2017-10-01 DIAGNOSIS — E785 Hyperlipidemia, unspecified: Secondary | ICD-10-CM

## 2017-10-01 MED ORDER — METOPROLOL SUCCINATE ER 50 MG PO TB24: 50.0000 mg | ORAL_TABLET | Freq: Every day | ORAL | 3 refills | Status: DC

## 2017-10-01 MED ORDER — METOPROLOL SUCCINATE ER 100 MG PO TB24: 50.0000 mg | ORAL_TABLET | Freq: Every day | ORAL | 3 refills | Status: DC

## 2017-10-01 NOTE — Patient Instructions (Addendum)
We will be checking the following labs today - BMET, CBC, HPF, TSH, and UA   Medication Instructions:    Continue with your current medicines. BUT  STOP Benicar  Cut Toprol in half to just 50 mg at night    Testing/Procedures To Be Arranged:  N/A  Follow-Up:   See me next week    Other Special Instructions:   V8 Juice daily  Liberalize your salt  Support stockings    If you need a refill on your cardiac medications before your next appointment, please call your pharmacy.   Call the [place] office at [phone] if you have any questions, problems or concerns.

## 2017-10-02 LAB — BASIC METABOLIC PANEL
BUN/Creatinine Ratio: 18 (ref 10–24)
BUN: 20 mg/dL (ref 8–27)
CO2: 19 mmol/L — ABNORMAL LOW (ref 20–29)
Calcium: 10.1 mg/dL (ref 8.6–10.2)
Chloride: 101 mmol/L (ref 96–106)
Creatinine, Ser: 1.12 mg/dL (ref 0.76–1.27)
GFR calc Af Amer: 78 mL/min/{1.73_m2} (ref >59)
GFR calc non Af Amer: 67 mL/min/{1.73_m2} (ref >59)
Glucose: 117 mg/dL — ABNORMAL HIGH (ref 65–99)
Potassium: 4.8 mmol/L (ref 3.5–5.2)
Sodium: 139 mmol/L (ref 134–144)

## 2017-10-02 LAB — URINALYSIS
Bilirubin, UA: NEGATIVE
Leukocytes, UA: NEGATIVE
Nitrite, UA: NEGATIVE
Protein, UA: NEGATIVE
RBC, UA: NEGATIVE
Specific Gravity, UA: >=1.03 — AB (ref 1.005–1.030)
Urobilinogen, Ur: 0.2 mg/dL (ref 0.2–1.0)
pH, UA: 5 (ref 5.0–7.5)

## 2017-10-02 LAB — CBC
Hematocrit: 43.4 % (ref 37.5–51.0)
Hemoglobin: 14 g/dL (ref 13.0–17.7)
MCH: 27.9 pg (ref 26.6–33.0)
MCHC: 32.3 g/dL (ref 31.5–35.7)
MCV: 87 fL (ref 79–97)
Platelets: 480 10*3/uL — ABNORMAL HIGH (ref 150–450)
RBC: 5.02 x10E6/uL (ref 4.14–5.80)
RDW: 14.4 % (ref 12.3–15.4)
WBC: 8.6 10*3/uL (ref 3.4–10.8)

## 2017-10-02 LAB — HEPATIC FUNCTION PANEL
ALT: 31 IU/L (ref 0–44)
AST: 21 IU/L (ref 0–40)
Albumin: 4.3 g/dL (ref 3.6–4.8)
Alkaline Phosphatase: 94 IU/L (ref 39–117)
Bilirubin Total: 0.5 mg/dL (ref 0.0–1.2)
Bilirubin, Direct: 0.13 mg/dL (ref 0.00–0.40)
Total Protein: 6.9 g/dL (ref 6.0–8.5)

## 2017-10-02 LAB — TSH: TSH: 4.17 u[IU]/mL (ref 0.450–4.500)

## 2017-10-07 ENCOUNTER — Encounter: Payer: Self-pay | Admitting: Nurse Practitioner

## 2017-10-07 ENCOUNTER — Encounter (INDEPENDENT_AMBULATORY_CARE_PROVIDER_SITE_OTHER): Payer: Self-pay

## 2017-10-07 ENCOUNTER — Ambulatory Visit: Payer: Medicare Other | Admitting: Nurse Practitioner

## 2017-10-07 VITALS — BP 102/70 | HR 83 | Ht 77.0 in | Wt 273.8 lb

## 2017-10-07 DIAGNOSIS — I951 Orthostatic hypotension: Secondary | ICD-10-CM

## 2017-10-07 NOTE — Patient Instructions (Signed)
We will be checking the following labs today - NONE   Medication Instructions:    Continue with your current medicines. BUT  I want you to change the Midodrine to twice a day (cut out the mid day dose) for 3 days   Then, if BP ok - cut the Midodrine to 1/2 tablet twice a day for 3 days  Then, if BP still ok, cut to 1/2 tablet just in the AM for 3 days and   THEN STOP    Testing/Procedures To Be Arranged:  N/A  Follow-Up:   See me in about 4 to 6 weeks - we can do your fasting labs that day.     Other Special Instructions:   Monitor your BP for me and keep your record    If you need a refill on your cardiac medications before your next appointment, please call your pharmacy.                                Ascending Aortic Aneurysm/ Thoracic Aortic Aneurysm   Recent studies have raised concern that fluoroquinolone antibiotics could be associated with an increased risk of aortic aneurysm or aortic dissection. You should avoid use of Cipro and other associated antibiotics (flouroquinolone antibiotics )  It is  best to avoid activities that cause grunting or straining (medically referred to as a "valsalva maneuver"). This happens when a person bears down against a closed throat to increase the strength of arm or abdominal muscles. There's often a tendency to do this when lifting heavy weights, doing sit-ups, push-ups or chin-ups, etc., but it may be harmful.     An aneurysm is a bulge in an artery. It happens when blood pushes up against a weakened or damaged artery wall. A thoracic aortic aneurysm is an aneurysm that occurs in the first part of the aorta, between the heart and the diaphragm. The aorta is the main artery of the body. It supplies blood from the heart to the rest of the body. Some aneurysms may not cause symptoms or problems. However, the major concern with a thoracic aortic aneurysm is that it can enlarge and burst (rupture), or blood can flow between the  layers of the wall of the aorta through a tear (aorticdissection). Both of these conditions can cause bleeding inside the body and can be life-threatening if they are not diagnosed and treated right away. What are the causes? The exact cause of this condition is not known. What increases the risk? The following factors may make you more likely to develop this condition:  Being age 44 or older.  Having a hardening of the arteries caused by the buildup of fat and other substances in the lining of a blood vessel (arteriosclerosis).  Having inflammation of the walls of an artery (arteritis).  Having a genetic disease that weakens the body's connective tissue, such as Marfan syndrome.  Having an injury or trauma to the aorta.  Having an infection that is caused by bacteria, such as syphilis or staphylococcus, in the wall of the aorta (infectious aortitis).  Having high blood pressure (hypertension).  Being male.  Being white (Caucasian).  Having high cholesterol.  Having a family history of aneurysms.  Using tobacco.  Having chronic obstructive pulmonary disease (COPD). What are the signs or symptoms? Symptoms of this condition vary depending on the size and rate of growth of the aneurysm. Most grow slowly and do not cause  any symptoms. When symptoms do occur, they may include:  Pain in the chest, back, sides, or abdomen. The pain may vary in intensity. A sudden onset of severe pain may indicate that the aneurysm has ruptured.  Hoarseness.  Cough.  Shortness of breath.  Swallowing problems.  Swelling in the face, arms, or legs.  Fever.  Unexplained weight loss. How is this diagnosed? This condition may be diagnosed with:  An ultrasound.  X-rays.  A CT scan.  An MRI.  Tests to check the arteries for damage or blockages (angiogram). Most unruptured thoracic aortic aneurysms cause no symptoms, so they are often found during exams for other conditions. How is this  treated? Treatment for this condition depends on:  The size of the aneurysm.  How fast the aneurysm is growing.  Your age.  Risk factors for rupture. Aneurysms that are smaller than 2.2 inches (5.5 cm) may be managed by using medicines to control blood pressure, manage pain, or fight infection. You may need regular monitoring to see if the aneurysm is getting bigger. Your health care provider may recommend that you have an ultrasound every year or every 6 months. How often you need to have an ultrasound depends on the size of the aneurysm, how fast it is growing, and whether you have a family history of aneurysms. Surgical repair may be needed if your aneurysm is larger than 2.2 inches or if it is growing quickly. Follow these instructions at home: Eating and drinking   Eat a healthy diet. Your health care provider may recommend that you:  Lower your salt (sodium) intake. In some people, too much salt can raise blood pressure and increase the risk of thoracic aortic aneurysm.  Avoid foods that are high in saturated fat and cholesterol, such as red meat and dairy.  Eat a diet that is low in sugar.  Increase your fiber intake by including whole grains, vegetables, and fruits in your diet. Eating these foods may help to lower blood pressure.  Limit or avoid alcohol as recommended by your health care provider. Lifestyle   Follow instructions from your health care provider about healthy lifestyle habits. Your health care provider may recommend that you:  Do not use any products that contain nicotine or tobacco, such as cigarettes and e-cigarettes. If you need help quitting, ask your health care provider.  Keep your blood pressure within normal limits. The target limit for most people is below 120/80. Check your blood pressure regularly. If it is high, ask your health care provider about ways that you can control it.  Keep your blood sugar (glucose) level and cholesterol levels within  normal limits. Target limits for most people are:  Blood glucose level: Less than 100 mg/dL.  Total cholesterol level: Less than 200 mg/dL.  Maintain a healthy weight. Activity   Stay physically active and exercise regularly. Talk with your health care provider about how often you should exercise and ask which types of exercise are safe for you.  Avoid heavy lifting and activities that take a lot of effort (are strenuous). Ask your health care provider what activities are safe for you. General instructions   Keep all follow-up visits as told by your health care provider. This is important.  Talk with your health care provider about regular screenings to see if the aneurysm is getting bigger.  Take over-the-counter and prescription medicines only as told by your health care provider. Contact a health care provider if:  You have discomfort in your  upper back, neck, or abdomen.  You have trouble swallowing.  You have a cough or hoarseness.  You have a family history of aneurysms.  You have unexplained weight loss. Get help right away if:  You have sudden, severe pain in your upper back and abdomen. This pain may move into your chest and arms.  You have shortness of breath.  You have a fever. This information is not intended to replace advice given to you by your health care provider. Make sure you discuss any questions you have with your health care provider. Document Released: 02/25/2005 Document Revised: 12/08/2015 Document Reviewed: 12/08/2015 Elsevier Interactive Patient Education  2017 [place].   Aortic Dissection An aortic dissection happens when there is a tear in the main blood vessel of the body (aorta). The aorta comes out of the heart, curves around, and then goes down the chest (thoracic aorta) and into the abdomen (abdominal aorta) to supply arteries with blood. The wall of the aorta has inner and outer layers. Aortic dissection occurs most often in the  thoracic aorta. As the tear widens and blood flows through it, the aorta becomes "double-barreled." This means that one part of the aorta continues to carry blood to the body, but blood also flows into the tear, between the layers of the aorta. The torn part of the aorta fills with blood and swells up. This can reduce blood flow through the part of the aorta that is still supplying blood to the body. Aortic dissection is a medical emergency. What are the causes? An aortic dissection is commonly caused by weakening of the artery wall due to high blood pressure. Other causes may include:  An injury, such as from a car crash.  Birth defects that affect the heart (congenital heart defects).  Thickening of the artery walls. In some cases, the cause is not known. What increases the risk? The following factors may make you more likely to develop this condition:  Having certain medical conditions, such as:  High blood pressure (hypertension).  Hardening and narrowing of the arteries (atherosclerosis).  A genetic disorder that affects the connective tissue, such as Marfan syndrome or Ehlers-Danlos syndrome.  A condition that causes inflammation of blood vessels, such as giant cell arteritis.  Having a chest injury.  Having surgery on the aorta.  Being born with a congenital heart defect.  Being male.  Being older than age 79.  Using cocaine.  Smoking.  Lifting heavy weights or doing other types of high-intensity resistance training. What are the signs or symptoms? Signs and symptoms of aortic dissection start suddenly. The most common symptoms are:  Severe chest pain that may feel like tearing, stabbing, or sharp pain.  Severe pain that spreads (radiates) to the back, neck, jaw, or abdomen. Other symptoms may include:  Trouble breathing.  Dizziness or fainting.  Sudden weakness on one side of the body.  Nausea or vomiting.  Trouble swallowing.  Coughing up  blood.  Vomiting blood.  Clammy skin. How is this diagnosed? This condition may be diagnosed based on:  Your symptoms.  A physical exam. This may include:  Listening for abnormal blood flow sounds (murmurs) in your chest or abdomen.  Checking your pulse in your arms and legs.  Checking your blood pressure to see whether it is low or whether there is a difference between the measurements in your right and left arm.  Electrocardiogram (ECG). This test measures the electrical activity in your heart.  Chest X-ray.  CT  scan.  MRI.  Aortic angiogram. This test involves injecting dye to make it easier to see your blood vessels clearly.  Echocardiogram to study your heart using sound waves.  Blood tests. How is this treated? It is important to treat an aortic dissection as quickly as possible. Treatment may start as soon as your health care provider thinks that you have aortic dissection. Treatment depends on the location and severity of your dissection and your overall health. Treatment may include:  Medicines to lower your blood pressure.  Surgery to repair the dissected part of your aorta with artificial material (syntheticgraft).  A medical procedure to insert a stent-graft into the aorta (endovascular procedure). During this procedure, a long, thin tube (stent) is inserted into an artery near the groin (femoral artery) and moved up to the damaged part of the aorta. Then, the stent is opened to help improve blood flow and prevent future dissection. Follow these instructions at home: Activity   Avoid activities that could injure your chest or your abdomen. Ask your health care provider what activities are safe for you.  After you have recovered, try to stay active. Ask your health care provider what activities are safe for you after recovery.  Do not lift anything that is heavier than 10 lb (4.5 kg) until your health care provider approves.  Do not drive or use heavy  machinery while taking prescription pain medicine. Eating and drinking   Eat a heart-healthy diet, which includes lots of fresh fruits and vegetables, low-fat (lean) protein, and whole grains.  Check ingredients and nutrition facts on packaged foods and beverages, and avoid foods with high amounts of:  Salt (sodium).  Saturated fats (like red meat).  Trans fats (like fried food). General instructions   Take over-the-counter and prescription medicines only as told by your health care provider.  Work with your health care provider to manage your blood pressure.  Talk with your health care provider about how to manage stress.  Do not use any products that contain nicotine or tobacco, such as cigarettes and e-cigarettes. If you need help quitting, ask your health care provider.  Keep all follow-up visits as told by your health care provider. This is important. Get help right away if:  You develop any symptoms of aortic dissection after treatment, including severe pain in your chest, back, or abdomen.  You have a pain in your abdomen.  You have trouble breathing or you develop a cough.  You faint.  You develop a racing heartbeat. These symptoms may represent a serious problem that is an emergency. Do not wait to see if the symptoms will go away. Get medical help right away. Call your local emergency services (911 in the [place] Do not drive yourself to the hospital. Summary  An aortic dissection happens when there is a tear in the main blood vessel of the body (aorta). It is a medical emergency.  The most common symptom is severe pain in the chest that spreads (radiates) to the back, neck, jaw, or abdomen.  It is important to treat an aortic dissection as quickly as possible. Treatment typically includes surgery and medicines. This information is not intended to replace advice given to you by your health care provider. Make sure you discuss any questions you have with your health  care provider. Document Released: 06/04/2007 Document Revised: 01/15/2016 Document Reviewed: 01/15/2016 Elsevier Interactive Patient Education  2017 [employer].     Call the [place] office at [phone]  [phone] if you have any questions, problems or concerns.

## 2017-10-07 NOTE — Progress Notes (Signed)
CARDIOLOGY OFFICE NOTE  Date:  10/07/2017    James Rodgers Date of Birth: [birth year] Medical Record #[id number]  PCP:  Leanna Battles, MD  Cardiologist:  [facility]    Chief Complaint  Patient presents with  . Follow-up    1 week check - seen for Dr. Saunders Revel    History of Present Illness: James Rodgers is a 67 y.o. male who presents today for a 6 day check.  Seen for Dr. Saunders Revel. He is a former patient of Dr. Susa Simmonds as well as Dr. Claris Gladden. He would like to follow with me going forward.   He has a history of moderate nonobstructive coronary artery disease by catheterization in 2003, thoracic aortic aneurysm,hypertension,hyperlipidemia,and diabetes mellitus. He has been on chronic DAPT as part of his medical management of his CAD.   Last seen by Dr. Saunders Revel in March - felt to be doing ok. Some vague chest pain - but this has been chronic since his remote cath in 2003. He had noted some orthostasis while outside in the heat. BP was marginal at his visit but no medicine changes due to the occasional orthostasis.   I then saw him last week for a work in - quite orthostatic. Weight down over 20#. I stopped a lot of his medicines - this included [place]. He had been placed on Midodrine during an admission earlier in July.   Comes in today. Here with his wife. He looks much better. Feels better. Basically off all his BP meds and a lower dose of Metoprolol. He is off the [place] now altogether - will see Dr. Philip Aspen in November to reassess. He is liberalizing his salt. BP looks better at home - very few readings below 329 systolic.   Past Medical History:  Diagnosis Date  . Anal fissure   . Arthritis   . Basal cell carcinoma    neck  . CAD (coronary artery disease)    a. 2003 Cath: LAD 50, D1 50-60, RCA 40;  b. 12/2008 MV: no ischemia/infarct, EF 63%. // c. MV 8/17: mild inf-sept and apical ischemia, EF 53%, Low Risk  . DDD (degenerative disc disease), lumbar   .  Diabetes type 2, controlled ([place])   . GERD (gastroesophageal reflux disease)   . Hemorrhoids   . History of dizziness   . History of nuclear stress test    a. Myoview 8/17: EF 53%, mild inferoseptal and mild apical ischemia, low risk  . HLD (hyperlipidemia)   . HTN (hypertension)   . LBP (low back pain)    a. h/o surgery in 1991.  . Obesity   . Orthostatic lightheadedness    with heat exposure  . Osteoarthritis    knees  . Perineal abscess 09/2012  . Pneumonia 09/2005  . PONV (postoperative nausea and vomiting)   . Renal cyst, right   . Retinopathy    Mild, left eye  . Right ureteral stone   . Seasonal allergic rhinitis   . Sleep apnea    a. on cpap.  [name] Spondylosis    lumbar  . Thoracic aortic aneurysm ([place])    a. MRA 5/17 ascending thoracic aorta measuring 40 mm - recommend annual follow-up // b.  Echo 5/17: Moderate concentric LVH, EF 55-60%, normal wall motion, grade 1 diastolic dysfunction, ascending aortic diameter 45 mm, mild MR, mild LAE // c. Chest MRA 6/18: stable ascending aortic aneurysm measuring 4.1 cm (4.0 cm in 5/17). FU 1 year.  Past Surgical History:  Procedure Laterality Date  . anal fissure surgery  1981  . CARDIAC CATHETERIZATION  2004   1 vessel CAD  . COLONOSCOPY  0254,2706  . KNEE ARTHROSCOPY Left   . LASIK    . [place]   x2  . NASAL SINUS SURGERY  2001  . NASAL SINUS SURGERY  2002  . TOTAL KNEE ARTHROPLASTY Left 09/12/2017   Procedure: LEFT TOTAL KNEE ARTHROPLASTY;  Surgeon: Mcarthur Rossetti, MD;  Location: [facility];  Service: Orthopedics;  Laterality: Left;     Medications: Current Meds  Medication Sig  . aspirin EC 81 MG tablet Take 81 mg by mouth at bedtime.  [name] atorvastatin (LIPITOR) 80 MG tablet Take 80 mg by mouth at bedtime.  . clopidogrel (PLAVIX) 75 MG tablet Take 75 mg by mouth daily.  [name] docusate sodium (COLACE) 100 MG capsule Take 100 mg by mouth at bedtime.  [name] esomeprazole (NEXIUM) 40 MG capsule Take 40  mg by mouth daily.    . fexofenadine (ALLEGRA) 180 MG tablet Take 180 mg by mouth daily as needed (for allergies).   . FIBER PO Take 1 capsule by mouth 2 (two) times daily.  . fluticasone (FLONASE) 50 MCG/ACT nasal spray Place 2 sprays into both nostrils daily as needed for allergies or rhinitis.  [name] HYDROmorphone (DILAUDID) 2 MG tablet Take 1 tablet (2 mg total) by mouth every 4 (four) hours as needed for severe pain.  . metFORMIN (GLUCOPHAGE-XR) 500 MG 24 hr tablet Take 500 mg by mouth 2 (two) times daily.   . metoprolol succinate (TOPROL-XL) 50 MG 24 hr tablet Take 1 tablet (50 mg total) by mouth daily. Take with or immediately following a meal.  . midodrine (PROAMATINE) 5 MG tablet Take 1 tablet (5 mg total) by mouth 3 (three) times daily with meals.  . tamsulosin (FLOMAX) 0.4 MG CAPS capsule Take 0.4 mg by mouth at bedtime.   . [DISCONTINUED] aspirin EC 81 MG tablet Take 1 tablet (81 mg total) by mouth daily. (Patient taking differently: Take 81 mg by mouth at bedtime. )  . [DISCONTINUED] atorvastatin (LIPITOR) 80 MG tablet Take 1 tablet (80 mg total) by mouth daily. (Patient taking differently: Take 80 mg by mouth at bedtime. )     Allergies: Allergies  Allergen Reactions  . Codeine Other (See Comments)    dizzy    Social History: The patient  reports that he has never smoked. He has never used smokeless tobacco. He reports that he does not drink alcohol or use drugs.   Family History: The patient's family history includes Diabetes in his unknown relative; Heart disease in his father.   Review of Systems: Please see the history of present illness.   Otherwise, the review of systems is positive for none.   All other systems are reviewed and negative.   Physical Exam: VS:  BP 102/70 (BP Location: Left Arm, Patient Position: Sitting, Cuff Size: Large)   Pulse 83   Ht 6\' 5"  (1.956 m)   Wt 273 lb 12.8 oz (124.2 kg)   SpO2 99% Comment: at rest  BMI 32.47 kg/m  .  BMI Body mass  index is 32.47 kg/m.  Wt Readings from Last 3 Encounters:  10/07/17 273 lb 12.8 oz (124.2 kg)  10/01/17 272 lb 12.8 oz (123.7 kg)  09/12/17 296 lb 2 oz (134.3 kg)    General: Pleasant. Well developed, well nourished and in no acute distress.   HEENT:  Normal.  Neck: Supple, no JVD, carotid bruits, or masses noted.  Cardiac: Regular rate and rhythm. No murmurs, rubs, or gallops. No edema.  Respiratory:  Lungs are clear to auscultation bilaterally with normal work of breathing.  GI: Soft and nontender.  MS: No deformity or atrophy. Gait and ROM intact.  Skin: Warm and dry. Color is normal.  Neuro:  Strength and sensation are intact and no gross focal deficits noted.  Psych: Alert, appropriate and with normal affect.   LABORATORY DATA:  EKG:  EKG is not ordered today.  Lab Results  Component Value Date   WBC 8.6 10/01/2017   HGB 14.0 10/01/2017   HCT 43.4 10/01/2017   PLT 480 (H) 10/01/2017   GLUCOSE 117 (H) 10/01/2017   CHOL 144 08/21/2015   TRIG 184 (H) 08/21/2015   HDL 38 (L) 08/21/2015   LDLCALC 69 08/21/2015   ALT 31 10/01/2017   AST 21 10/01/2017   NA 139 10/01/2017   K 4.8 10/01/2017   CL 101 10/01/2017   CREATININE 1.12 10/01/2017   BUN 20 10/01/2017   CO2 19 (L) 10/01/2017   TSH 4.170 10/01/2017       BNP (last 3 results) No results for input(s): BNP in the last 8760 hours.  ProBNP (last 3 results) No results for input(s): PROBNP in the last 8760 hours.   Other Studies Reviewed Today:  MRA CHEST FINDINGS 08/2017: Maximal diameters of the ascending aorta at the sinus of Valsalva, sino-tubular junction, and ascending aorta are 4.1 cm, 3.5 cm, and 4.1 cm respectively. This compares with a maximal diameter of 4.1 cm in the ascending aorta on the prior study. There is no evidence of dissection. There is no evidence of acute intramural hematoma. The great vessels are patent including the vertebral arteries, within the confines of the examination.  No  obvious mediastinal mass. Small nodule in the right middle lobe on image 39 of series 11 is stable. No acute findings in the abdomen.  IMPRESSION: Maximal diameter of the ascending aorta is stable at 4.1 cm. Recommend annual imaging followup by CTA or MRA. This recommendation follows 2010 ACCF/AHA/AATS/ACR/ASA/SCA/SCAI/SIR/STS/SVM Guidelines for the Diagnosis and Management of Patients with Thoracic Aortic Disease. Circulation. 2010; 121: Z610-R604   Electronically Signed By: Marybelle Killings M.D. On: 08/12/2017 08:45    Myoview Study Highlights 10/2015    The left ventricular ejection fraction is mildly decreased (45-54%).  Nuclear stress EF: 53%.  There was no ST segment deviation noted during stress.  Defect 1: There is a small defect of moderate severity present in the basal inferoseptal and mid inferoseptal location.  Defect 2: There is a small defect of mild severity present in the apex location.  This is a low risk study.  Low risk stress nuclear study with mild ischemia in the basal inferoseptal wall and mild ischemia in the apical wall; EF 53 with mild global hypokinesis and mild LVE.   Echo Study Conclusions 07/2015  - Left ventricle: The cavity size was normal. There was moderate concentric hypertrophy. Systolic function was normal. The estimated ejection fraction was in the range of 55% to 60%. Wall motion was normal; there were no regional wall motion abnormalities. Doppler parameters are consistent with abnormal left ventricular relaxation (grade 1 diastolic dysfunction). - Aortic valve: Trileaflet; mildly thickened, mildly calcified leaflets. - Aorta: Ascending aortic diameter: 45 mm (S). - Mitral valve: There was mild regurgitation. - Left atrium: The atrium was mildly dilated. - Right ventricle: The cavity size was  mildly dilated. Wall thickness was normal  Assessment/Plan:  1. Orthostasis - was very symptomatic - I think  this was exacerbated by extreme weight loss - I stopped his ARB and cut the beta blocker in half. He had been taken off Minoxidil as well. Also stopped the [place] which has orthostasis as a side effect. Will start weaning Midodrine - hopefully be able to stop altogether.   2. CAD - nonobstructive - he has been on chronic DAPT therapy - low risk Myoview from 2017. He has no active chest pain. Favor continue CV risk factor modification   3. Thoracic aneurysm - will need monitoring - next study in June of 2020. He is to avoid excessive straining and Cipro - education given today.   4. HTN - currently not an issue. They will continue to monitor.   5. HLD - on statin. I will recheck when I see him back.   Current medicines are reviewed with the patient today.  The patient does not have concerns regarding medicines other than what has been noted above.  The following changes have been made:  See above.  Labs/ tests ordered today include:   No orders of the defined types were placed in this encounter.    Disposition:   FU with me in 4 to 6 weeks with fasting labs.   Patient is agreeable to this plan and will call if any problems develop in the interim.   SignedTruitt Merle, NP  10/07/2017 2:58 PM  [facility] [address] [place], [place]  [address] Phone: [phone] Fax: [phone]

## 2017-10-23 ENCOUNTER — Encounter (INDEPENDENT_AMBULATORY_CARE_PROVIDER_SITE_OTHER): Payer: Self-pay | Admitting: Orthopaedic Surgery

## 2017-10-23 ENCOUNTER — Ambulatory Visit (INDEPENDENT_AMBULATORY_CARE_PROVIDER_SITE_OTHER): Payer: Medicare Other | Admitting: Orthopaedic Surgery

## 2017-10-23 DIAGNOSIS — Z96652 Presence of left artificial knee joint: Secondary | ICD-10-CM

## 2017-10-23 MED ORDER — HYDROMORPHONE HCL 2 MG PO TABS: 2.0000 mg | ORAL_TABLET | ORAL | 0 refills | Status: DC | PRN

## 2017-10-23 NOTE — Progress Notes (Signed)
The patient is 6 weeks status post a left total knee arthroplasty.  He reports he is making progress with his strength and range of motion.  On examination of his left knee there is still some swelling to be expected.  He almost has full extension.  His flexion is to 100 degrees.  The knee feels ligaments is stable.  At this point we will continue increase his activities.  I will refill his Dilaudid one more time.  I would like to see him back in 4 weeks for repeat exam.  All question concerns were answered and addressed.

## 2017-11-11 ENCOUNTER — Other Ambulatory Visit: Payer: Medicare Other

## 2017-11-19 ENCOUNTER — Encounter: Payer: Self-pay | Admitting: Nurse Practitioner

## 2017-11-19 ENCOUNTER — Ambulatory Visit (INDEPENDENT_AMBULATORY_CARE_PROVIDER_SITE_OTHER): Payer: Medicare Other | Admitting: Nurse Practitioner

## 2017-11-19 VITALS — Ht 77.0 in | Wt 283.0 lb

## 2017-11-19 DIAGNOSIS — I951 Orthostatic hypotension: Secondary | ICD-10-CM

## 2017-11-19 NOTE — Progress Notes (Signed)
CARDIOLOGY OFFICE NOTE  Date:  11/19/2017    James Rodgers Date of Birth: 1951/01/05 Medical Record #[id number]  PCP:  Leanna Battles, MD  Cardiologist:  Servando Snare    Chief Complaint  Patient presents with  . Hypertension    History of Present Illness: James Rodgers is a 67 y.o. male who presents today for a follow up visit. Seen for Dr. Saunders Revel. He is a former patient of Dr. Susa Simmonds as well as Dr. Claris Gladden. He would like to follow with me going forward.   He has a history ofmoderate nonobstructive coronary artery disease by catheterization in 2003, thoracic aortic aneurysm,hypertension,hyperlipidemia,and diabetes mellitus. He has been on chronic DAPT as part of his medical management of his CAD.   Last seen by Dr. Saunders Revel in March - felt to be doing ok. Some vague chest pain - but this has been chronic since his remote cath in 2003. He had noted some orthostasis while outside in the heat. BP was marginal at his visit but no medicine changes due to the occasional orthostasis.   I saw him back in July several times - he was very orthostatic following knee surgery and weight loss. Lots of medicines were stopped. I was able to wean off Midodrine. He was doing better at last visit.   Comes in today. Here alone today. Notes BP up and down. Not sure if his cuff is correlating or not - has never been checked. Some swelling in the knee - it does hurt - this seems to influence his BP. He is back gaining weight. Feels fine. Off narcotics and Midodrine.   Past Medical History:  Diagnosis Date  . Anal fissure   . Arthritis   . Basal cell carcinoma    neck  . CAD (coronary artery disease)    a. 2003 Cath: LAD 50, D1 50-60, RCA 40;  b. 12/2008 MV: no ischemia/infarct, EF 63%. // c. MV 8/17: mild inf-sept and apical ischemia, EF 53%, Low Risk  . DDD (degenerative disc disease), lumbar   . Diabetes type 2, controlled ([place])   . GERD (gastroesophageal reflux disease)   .  Hemorrhoids   . History of dizziness   . History of nuclear stress test    a. Myoview 8/17: EF 53%, mild inferoseptal and mild apical ischemia, low risk  . HLD (hyperlipidemia)   . HTN (hypertension)   . LBP (low back pain)    a. h/o surgery in 1991.  . Obesity   . Orthostatic lightheadedness    with heat exposure  . Osteoarthritis    knees  . Perineal abscess 09/2012  . Pneumonia 09/2005  . PONV (postoperative nausea and vomiting)   . Renal cyst, right   . Retinopathy    Mild, left eye  . Right ureteral stone   . Seasonal allergic rhinitis   . Sleep apnea    a. on cpap.  [name] Spondylosis    lumbar  . Thoracic aortic aneurysm ([place])    a. MRA 5/17 ascending thoracic aorta measuring 40 mm - recommend annual follow-up // b.  Echo 5/17: Moderate concentric LVH, EF 55-60%, normal wall motion, grade 1 diastolic dysfunction, ascending aortic diameter 45 mm, mild MR, mild LAE // c. Chest MRA 6/18: stable ascending aortic aneurysm measuring 4.1 cm (4.0 cm in 5/17). FU 1 year.    Past Surgical History:  Procedure Laterality Date  . anal fissure surgery  1981  . CARDIAC CATHETERIZATION  2004   1  vessel CAD  . COLONOSCOPY  [id number]  . KNEE ARTHROSCOPY Left   . LASIK    . [place]   x2  . NASAL SINUS SURGERY  2001  . NASAL SINUS SURGERY  2002  . TOTAL KNEE ARTHROPLASTY Left 09/12/2017   Procedure: LEFT TOTAL KNEE ARTHROPLASTY;  Surgeon: Mcarthur Rossetti, MD;  Location: [facility];  Service: Orthopedics;  Laterality: Left;     Medications: No outpatient medications have been marked as taking for the 11/19/17 encounter (Office Visit) with Burtis Junes, NP.     Allergies: Allergies  Allergen Reactions  . Codeine Other (See Comments)    dizzy    Social History: The patient  reports that he has never smoked. He has never used smokeless tobacco. He reports that he does not drink alcohol or use drugs.   Family History: The patient's family history  includes Diabetes in his unknown relative; Heart disease in his father.   Review of Systems: Please see the history of present illness.   Otherwise, the review of systems is positive for none.   All other systems are reviewed and negative.   Physical Exam: VS:  Ht 6\' 5"  (1.956 m)   Wt 283 lb (128.4 kg)   BMI 33.56 kg/m  .  BMI Body mass index is 33.56 kg/m.  Wt Readings from Last 3 Encounters:  11/19/17 283 lb (128.4 kg)  10/07/17 273 lb 12.8 oz (124.2 kg)  10/01/17 272 lb 12.8 oz (123.7 kg)   His BP in the left arm is 122/60 and 140/80 in the right.   His exam was normal. Right knee is swollen.   LABORATORY DATA:  EKG:  EKG is not ordered today.  Lab Results  Component Value Date   WBC 8.6 10/01/2017   HGB 14.0 10/01/2017   HCT 43.4 10/01/2017   PLT 480 (H) 10/01/2017   GLUCOSE 117 (H) 10/01/2017   CHOL 144 08/21/2015   TRIG 184 (H) 08/21/2015   HDL 38 (L) 08/21/2015   LDLCALC 69 08/21/2015   ALT 31 10/01/2017   AST 21 10/01/2017   NA 139 10/01/2017   K 4.8 10/01/2017   CL 101 10/01/2017   CREATININE 1.12 10/01/2017   BUN 20 10/01/2017   CO2 19 (L) 10/01/2017   TSH 4.170 10/01/2017       BNP (last 3 results) No results for input(s): BNP in the last 8760 hours.  ProBNP (last 3 results) No results for input(s): PROBNP in the last 8760 hours.   Other Studies Reviewed Today:  MRA CHESTFINDINGS6/2019: Maximal diameters of the ascending aorta at the sinus of Valsalva, sino-tubular junction, and ascending aorta are 4.1 cm, 3.5 cm, and 4.1 cm respectively. This compares with a maximal diameter of 4.1 cm in the ascending aorta on the prior study. There is no evidence of dissection. There is no evidence of acute intramural hematoma. The great vessels are patent including the vertebral arteries, within the confines of the examination.  No obvious mediastinal mass. Small nodule in the right middle lobe on image 39 of series 11 is stable. No acute findings  in the abdomen.  IMPRESSION: Maximal diameter of the ascending aorta is stable at 4.1 cm. Recommend annual imaging followup by CTA or MRA. This recommendation follows 2010 ACCF/AHA/AATS/ACR/ASA/SCA/SCAI/SIR/STS/SVM Guidelines for the Diagnosis and Management of Patients with Thoracic Aortic Disease. Circulation. 2010; 121: L244-W102   Electronically Signed By: Marybelle Killings M.D. On: 08/12/2017 08:45    MyoviewStudy Highlights8/2017  The left ventricular ejection fraction is mildly decreased (45-54%).  Nuclear stress EF: 53%.  There was no ST segment deviation noted during stress.  Defect 1: There is a small defect of moderate severity present in the basal inferoseptal and mid inferoseptal location.  Defect 2: There is a small defect of mild severity present in the apex location.  This is a low risk study.  Low risk stress nuclear study with mild ischemia in the basal inferoseptal wall and mild ischemia in the apical wall; EF 53 with mild global hypokinesis and mild LVE.   EchoStudy Conclusions5/2017  - Left ventricle: The cavity size was normal. There was moderate concentric hypertrophy. Systolic function was normal. The estimated ejection fraction was in the range of 55% to 60%. Wall motion was normal; there were no regional wall motion abnormalities. Doppler parameters are consistent with abnormal left ventricular relaxation (grade 1 diastolic dysfunction). - Aortic valve: Trileaflet; mildly thickened, mildly calcified leaflets. - Aorta: Ascending aortic diameter: 45 mm (S). - Mitral valve: There was mild regurgitation. - Left atrium: The atrium was mildly dilated. - Right ventricle: The cavity size was mildly dilated. Wall thickness was normal  Assessment/Plan:  Prior orthostasis - seems resolved. Hard to say what BP is doing - need to check his cuff for correlation - we may need to start adding back medicines.   No charge  for today - will recheck next week with his cuff.      Current medicines are reviewed with the patient today.  The patient does not have concerns regarding medicines other than what has been noted above.  The following changes have been made:  See above.  Labs/ tests ordered today include:   No orders of the defined types were placed in this encounter.    Disposition:   FU with me next week.  Patient is agreeable to this plan and will call if any problems develop in the interim.   SignedTruitt Merle, NP  11/19/2017 1:59 PM  [facility] [address] [place], [place]  [address] Phone: [phone] Fax: [phone]

## 2017-11-19 NOTE — Patient Instructions (Signed)
I will see you next week with your BP cuff.

## 2017-11-20 ENCOUNTER — Encounter (INDEPENDENT_AMBULATORY_CARE_PROVIDER_SITE_OTHER): Payer: Self-pay | Admitting: Orthopaedic Surgery

## 2017-11-20 ENCOUNTER — Ambulatory Visit (INDEPENDENT_AMBULATORY_CARE_PROVIDER_SITE_OTHER): Payer: Medicare Other | Admitting: Orthopaedic Surgery

## 2017-11-20 DIAGNOSIS — Z96652 Presence of left artificial knee joint: Secondary | ICD-10-CM

## 2017-11-20 NOTE — Progress Notes (Signed)
The patient is a 67 year old who is now 6 weeks status post a left total knee arthroplasty.  He is doing well overall.  On examination he has almost full extension to 120 degrees flexion.  The knee feels stable.  At this point he can stop outpatient physical therapy.  He can increase his golf as comfort allows.  All question concerns were answered and addressed.  We will see him back in 6 months with an AP and lateral of his left knee.

## 2017-11-24 ENCOUNTER — Encounter: Payer: Self-pay | Admitting: Nurse Practitioner

## 2017-11-25 NOTE — Progress Notes (Signed)
CARDIOLOGY OFFICE NOTE  Date:  11/26/2017    James Rodgers Date of Birth: [birth year] Medical Record #[id number]  PCP:  Leanna Battles, MD  Cardiologist:  Servando Snare   Chief Complaint  Patient presents with  . Hypertension    Follow up visit - seen for Dr. Saunders Revel    History of Present Illness: James Rodgers is a 67 y.o. male who presents today for a follow up visit.  Seen for Dr. Saunders Revel. He is a former patient of Dr. Susa Simmonds as well as Dr. Claris Gladden.  He would like to follow with me going forward.  He has a history ofmoderate nonobstructive coronary artery disease by catheterization in 2003, thoracic aortic aneurysm,hypertension,hyperlipidemia,and diabetes mellitus. He has been on chronic DAPT as part of his medical management of his CAD.   Last seen by Dr. Saunders Revel in March of 2019 - felt to be doing ok. Some vague chest pain - but this has been chronic since his remote cath in 2003. He had noted some orthostasis while outside in the heat. BP was marginal at his visit but no medicine changes due to the occasional orthostasis.  I saw him back in July several times - he was very orthostatic following his knee surgery and in the setting of significant weight loss. Lots of medicines were stopped. I was able to wean off Midodrine. He was doing better at last visit.   He was here last week - BP readings were up and down at home - not bad here - unknown if his cuff correlated or not - I asked him to come back today so we could recheck and readdress his medicines.   Comes in today. Here with his wife today. Doing well. BP tracking up. His cuff correlates fairly well. No chest pain. Breathing is good. Not really exercising until wife improves (has had GU stent placed). His knee is improving. He is restricting his salt. Overall, feels ok.   Past Medical History:  Diagnosis Date  . Anal fissure   . Arthritis   . Basal cell carcinoma    neck  . CAD (coronary artery disease)     a. 2003 Cath: LAD 50, D1 50-60, RCA 40;  b. 12/2008 MV: no ischemia/infarct, EF 63%. // c. MV 8/17: mild inf-sept and apical ischemia, EF 53%, Low Risk  . DDD (degenerative disc disease), lumbar   . Diabetes type 2, controlled ([place])   . GERD (gastroesophageal reflux disease)   . Hemorrhoids   . History of dizziness   . History of nuclear stress test    a. Myoview 8/17: EF 53%, mild inferoseptal and mild apical ischemia, low risk  . HLD (hyperlipidemia)   . HTN (hypertension)   . LBP (low back pain)    a. h/o surgery in 1991.  . Obesity   . Orthostatic lightheadedness    with heat exposure  . Osteoarthritis    knees  . Perineal abscess 09/2012  . Pneumonia 09/2005  . PONV (postoperative nausea and vomiting)   . Renal cyst, right   . Retinopathy    Mild, left eye  . Right ureteral stone   . Seasonal allergic rhinitis   . Sleep apnea    a. on cpap.  [name] Spondylosis    lumbar  . Thoracic aortic aneurysm ([place])    a. MRA 5/17 ascending thoracic aorta measuring 40 mm - recommend annual follow-up // b.  Echo 5/17: Moderate concentric LVH, EF 55-60%, normal wall motion, grade  1 diastolic dysfunction, ascending aortic diameter 45 mm, mild MR, mild LAE // c. Chest MRA 6/18: stable ascending aortic aneurysm measuring 4.1 cm (4.0 cm in 5/17). FU 1 year.    Past Surgical History:  Procedure Laterality Date  . anal fissure surgery  1981  . CARDIAC CATHETERIZATION  2004   1 vessel CAD  . COLONOSCOPY  [id number]  . KNEE ARTHROSCOPY Left   . LASIK    . [place]   x2  . NASAL SINUS SURGERY  2001  . NASAL SINUS SURGERY  2002  . TOTAL KNEE ARTHROPLASTY Left 09/12/2017   Procedure: LEFT TOTAL KNEE ARTHROPLASTY;  Surgeon: Mcarthur Rossetti, MD;  Location: [facility];  Service: Orthopedics;  Laterality: Left;     Medications: Current Meds  Medication Sig  . aspirin EC 81 MG tablet Take 81 mg by mouth at bedtime.  [name] atorvastatin (LIPITOR) 80 MG tablet Take 80 mg by  mouth at bedtime.  . clopidogrel (PLAVIX) 75 MG tablet Take 75 mg by mouth daily.  [name] docusate sodium (COLACE) 100 MG capsule Take 100 mg by mouth at bedtime.  [name] esomeprazole (NEXIUM) 40 MG capsule Take 40 mg by mouth daily.    . fexofenadine (ALLEGRA) 180 MG tablet Take 180 mg by mouth daily as needed (for allergies).   . FIBER PO Take 1 capsule by mouth 2 (two) times daily.  . fluticasone (FLONASE) 50 MCG/ACT nasal spray Place 2 sprays into both nostrils daily as needed for allergies or rhinitis.  . metFORMIN (GLUCOPHAGE-XR) 500 MG 24 hr tablet Take 500 mg by mouth 2 (two) times daily.   . metoprolol succinate (TOPROL-XL) 50 MG 24 hr tablet Take 1 tablet (50 mg total) by mouth daily. Take with or immediately following a meal.  . tamsulosin (FLOMAX) 0.4 MG CAPS capsule Take 0.4 mg by mouth at bedtime.      Allergies: Allergies  Allergen Reactions  . Codeine Other (See Comments)    dizzy    Social History: The patient  reports that he has never smoked. He has never used smokeless tobacco. He reports that he does not drink alcohol or use drugs.   Family History: The patient's family history includes Diabetes in his unknown relative; Heart disease in his father.   Review of Systems: Please see the history of present illness.   Otherwise, the review of systems is positive for none.   All other systems are reviewed and negative.   Physical Exam: VS:  BP (!) 160/78 (BP Location: Left Arm, Patient Position: Sitting, Cuff Size: Large)   Pulse 74   Ht 6\' 5"  (1.956 m)   Wt 280 lb (127 kg)   SpO2 96% Comment: at rest  BMI 33.20 kg/m  .  BMI Body mass index is 33.2 kg/m.  Wt Readings from Last 3 Encounters:  11/26/17 280 lb (127 kg)  11/26/17 283 lb 3.2 oz (128.5 kg)  11/19/17 283 lb (128.4 kg)    His cuff today was 169/104 - our BP was 160/78  BP by me is 150/80.  General: Pleasant. Well developed, well nourished and in no acute distress.   HEENT: Normal.  Neck: Supple, no JVD,  carotid bruits, or masses noted.  Cardiac: Regular rate and rhythm. No murmurs, rubs, or gallops. No edema.  Respiratory:  Lungs are clear to auscultation bilaterally with normal work of breathing.  GI: Soft and nontender.  MS: No deformity or atrophy. Gait and ROM intact.  Skin:  Warm and dry. Color is normal.  Neuro:  Strength and sensation are intact and no gross focal deficits noted.  Psych: Alert, appropriate and with normal affect.   LABORATORY DATA:  EKG:  EKG is not ordered today.  Lab Results  Component Value Date   WBC 8.6 10/01/2017   HGB 14.0 10/01/2017   HCT 43.4 10/01/2017   PLT 480 (H) 10/01/2017   GLUCOSE 117 (H) 10/01/2017   CHOL 144 08/21/2015   TRIG 184 (H) 08/21/2015   HDL 38 (L) 08/21/2015   LDLCALC 69 08/21/2015   ALT 31 10/01/2017   AST 21 10/01/2017   NA 139 10/01/2017   K 4.8 10/01/2017   CL 101 10/01/2017   CREATININE 1.12 10/01/2017   BUN 20 10/01/2017   CO2 19 (L) 10/01/2017   TSH 4.170 10/01/2017       BNP (last 3 results) No results for input(s): BNP in the last 8760 hours.  ProBNP (last 3 results) No results for input(s): PROBNP in the last 8760 hours.   Other Studies Reviewed Today:  MRA CHESTFINDINGS6/2019: Maximal diameters of the ascending aorta at the sinus of Valsalva, sino-tubular junction, and ascending aorta are 4.1 cm, 3.5 cm, and 4.1 cm respectively. This compares with a maximal diameter of 4.1 cm in the ascending aorta on the prior study. There is no evidence of dissection. There is no evidence of acute intramural hematoma. The great vessels are patent including the vertebral arteries, within the confines of the examination.  No obvious mediastinal mass. Small nodule in the right middle lobe on image 39 of series 11 is stable. No acute findings in the abdomen.  IMPRESSION: Maximal diameter of the ascending aorta is stable at 4.1 cm. Recommend annual imaging followup by CTA or MRA. This recommendation follows  2010 ACCF/AHA/AATS/ACR/ASA/SCA/SCAI/SIR/STS/SVM Guidelines for the Diagnosis and Management of Patients with Thoracic Aortic Disease. Circulation. 2010; 121: V400-Q676   Electronically Signed By: Marybelle Killings M.D. On: 08/12/2017 08:45    MyoviewStudy Highlights8/2017    The left ventricular ejection fraction is mildly decreased (45-54%).  Nuclear stress EF: 53%.  There was no ST segment deviation noted during stress.  Defect 1: There is a small defect of moderate severity present in the basal inferoseptal and mid inferoseptal location.  Defect 2: There is a small defect of mild severity present in the apex location.  This is a low risk study.  Low risk stress nuclear study with mild ischemia in the basal inferoseptal wall and mild ischemia in the apical wall; EF 53 with mild global hypokinesis and mild LVE.   EchoStudy Conclusions5/2017  - Left ventricle: The cavity size was normal. There was moderate concentric hypertrophy. Systolic function was normal. The estimated ejection fraction was in the range of 55% to 60%. Wall motion was normal; there were no regional wall motion abnormalities. Doppler parameters are consistent with abnormal left ventricular relaxation (grade 1 diastolic dysfunction). - Aortic valve: Trileaflet; mildly thickened, mildly calcified leaflets. - Aorta: Ascending aortic diameter: 45 mm (S). - Mitral valve: There was mild regurgitation. - Left atrium: The atrium was mildly dilated. - Right ventricle: The cavity size was mildly dilated. Wall thickness was normal  Assessment/Plan:  1. Prior orthostasis - this is resolved.  2. CAD - nonobstructive - he remains on chronic DAPT therapy - he has done this for many years and wishes to continue - he had low risk Myoview from 2017. He has no active chest pain. Favor continue CV risk factor modification  3. Thoracic aneurysm - will need monitoring - next study in  June of 2020. We have discussed this at past visit.  4. HTN - BP is tracking up - adding back Benicar 20 mg a day. May end up having to increase the Toprol back up as well. He will monitor over the next 2 weeks. BMET in 2 weeks.   5. HLD - on statin.Rechecking lab today.   Current medicines are reviewed with the patient today.  The patient does not have concerns regarding medicines other than what has been noted above.  The following changes have been made:  See above.  Labs/ tests ordered today include:    Orders Placed This Encounter  Procedures  . Basic metabolic panel  . Hepatic function panel  . Lipid panel  . Basic metabolic panel     Disposition:   FU with me in 3 to 4 months.  Patient is agreeable to this plan and will call if any problems develop in the interim.   SignedTruitt Merle, NP  11/26/2017 3:41 PM  [facility] [address] [place], [place]  [address] Phone: [phone] Fax: [phone]

## 2017-11-25 NOTE — Progress Notes (Signed)
[facility]  PATIENT: James Rodgers DOB: 10-Jan-1951   REASON FOR VISIT: Follow-up for obstructive sleep apnea with  CPAP compliance  HISTORY FROM: Patient    HISTORY OF PRESENT ILLNESS:UPDATE 9/18/2019CM Mr. James Rodgers 67 year old male returns for follow-up with history of obstructive sleep apnea here for CPAP compliance.  He is doing well with his CPAP has no new issues.  Much less fatigue, no daytime drowsiness.  CPAP data dated 10/26/2017-11/24/2017 shows compliance greater than 4 hours at 93%.  Average usage 7 hours 30 minutes.  Pressure 8 to 15 cm.  EPR level 3 AHI 0.5.  ESS 3.  He had left knee replacement in July.  He returns for reevaluation  UPDATE 3/18/2019CM Mr. James Rodgers, 67 year old male returns for follow-up with history of obstructive sleep apnea here with new CPAP machine with initial CPAP compliance.  Data dated 04/25/2017 -05/24/2017 shows compliance greater than 4 hours 100%.  Average usage 7 hours 25 minutes pressure 5-15 cm.  EPR level 3 AHI 0.1 leaks 95th percentile 7.1.  Patient's only comment is that he had nasal pillows previously and he felt he got more air with those.  With his current mask he does not feel like he is getting enough air when he first goes to sleep but he sleeps well.  He was advised to go to [facility] and ask about nasal pillows.  He returns for reevaluation. 11/6/18CDMr. James Rodgers today on 14 January 2017, a patient I last encountered in February 2010, at the time referred by Dr. Dagmar Hait. The patient was 67 years old at the time, with a diagnosis of hypertension, hyperlipidemia, diabetes type 2, allergic rhinitis and existing diagnosis of obstructive sleep apnea on CPAP.  Altogether he used a setting between 12 and 14 cm water but had started to snore loudly again in spite of using CPAP. He had endorsed the Epworth sleepiness scale at the time at 16 points indicating a high degree of daytime sleepiness.  A polysomnogram  documented severe apnea to be still present with an AHI of 70.7/h only mildly accentuated by REM sleep and not depending on sleep position.  He did not have protracted oxygen desaturation but very loud snoring.  He stopped CPAP the same night reducing his AHI to 2.5 at a low pressure setting.  He was also fitted at the time with a Respironics comfort gel nasal mask.  He continues to use a nasal mask and continues to use the machine I prescribed almost 9 years ago.  He would be definitely due for a new machine now.  He continued to use 10 cm water pressure until now, highly compliant until his machine broke.   Chief complaint according to patient :" I need a new machine and my wife is bothered if I don't use CPAP "  Sleep habits are as follows: Mr. James Rodgers usually reads in bed for about a half hour, his bedtime is between 1030 and 11 PM, he has no trouble initiating sleep and usually can sleep through the night except for 1 or 2 nocturia breaks.  Since his machine broke he has to unplug and re-plotted in multiple times.  He does not use a humidifier feature after he developed condensation water in tube and in the mouth.  He sleeps on a flat mattress, with one pillow only and usually rests on his side- the bedroom is cool, quiet and dark.  He shares a bedroom with his spouse. The patient usually arises at 7 AM.  REVIEW OF SYSTEMS: Full 14 system review of systems performed and notable only for those listed, all others are neg:  Constitutional: neg  Cardiovascular: neg Ear/Nose/Throat: neg  Skin: neg Eyes: neg Respiratory: neg Gastroitestinal: neg  Hematology/Lymphatic: neg Endocrine: neg Musculoskeletal: neg Allergy/Immunology: Environmental allergies Neurological: neg Psychiatric: neg Sleep : Obstructive sleep apnea with CPAP   ALLERGIES: Allergies  Allergen Reactions  . Codeine Other (See Comments)    dizzy    HOME MEDICATIONS: Outpatient Medications Prior to Visit    Medication Sig Dispense Refill  . aspirin EC 81 MG tablet Take 81 mg by mouth at bedtime.    [name] atorvastatin (LIPITOR) 80 MG tablet Take 80 mg by mouth at bedtime.    . clopidogrel (PLAVIX) 75 MG tablet Take 75 mg by mouth daily.    [name] docusate sodium (COLACE) 100 MG capsule Take 100 mg by mouth at bedtime.    [name] esomeprazole (NEXIUM) 40 MG capsule Take 40 mg by mouth daily.      . fexofenadine (ALLEGRA) 180 MG tablet Take 180 mg by mouth daily as needed (for allergies).     . FIBER PO Take 1 capsule by mouth 2 (two) times daily.    . fluticasone (FLONASE) 50 MCG/ACT nasal spray Place 2 sprays into both nostrils daily as needed for allergies or rhinitis.    . metFORMIN (GLUCOPHAGE-XR) 500 MG 24 hr tablet Take 500 mg by mouth 2 (two) times daily.     . metoprolol succinate (TOPROL-XL) 50 MG 24 hr tablet Take 1 tablet (50 mg total) by mouth daily. Take with or immediately following a meal. 90 tablet 3  . tamsulosin (FLOMAX) 0.4 MG CAPS capsule Take 0.4 mg by mouth at bedtime.     [name] HYDROmorphone (DILAUDID) 2 MG tablet Take 1 tablet (2 mg total) by mouth every 4 (four) hours as needed for severe pain. 30 tablet 0  . midodrine (PROAMATINE) 5 MG tablet Take 1 tablet (5 mg total) by mouth 3 (three) times daily with meals. 30 tablet 0   No facility-administered medications prior to visit.     PAST MEDICAL HISTORY: Past Medical History:  Diagnosis Date  . Anal fissure   . Arthritis   . Basal cell carcinoma    neck  . CAD (coronary artery disease)    a. 2003 Cath: LAD 50, D1 50-60, RCA 40;  b. 12/2008 MV: no ischemia/infarct, EF 63%. // c. MV 8/17: mild inf-sept and apical ischemia, EF 53%, Low Risk  . DDD (degenerative disc disease), lumbar   . Diabetes type 2, controlled ([place])   . GERD (gastroesophageal reflux disease)   . Hemorrhoids   . History of dizziness   . History of nuclear stress test    a. Myoview 8/17: EF 53%, mild inferoseptal and mild apical ischemia, low risk  . HLD  (hyperlipidemia)   . HTN (hypertension)   . LBP (low back pain)    a. h/o surgery in 1991.  . Obesity   . Orthostatic lightheadedness    with heat exposure  . Osteoarthritis    knees  . Perineal abscess 09/2012  . Pneumonia 09/2005  . PONV (postoperative nausea and vomiting)   . Renal cyst, right   . Retinopathy    Mild, left eye  . Right ureteral stone   . Seasonal allergic rhinitis   . Sleep apnea    a. on cpap.  [name] Spondylosis    lumbar  . Thoracic aortic aneurysm ([place])  a. MRA 5/17 ascending thoracic aorta measuring 40 mm - recommend annual follow-up // b.  Echo 5/17: Moderate concentric LVH, EF 55-60%, normal wall motion, grade 1 diastolic dysfunction, ascending aortic diameter 45 mm, mild MR, mild LAE // c. Chest MRA 6/18: stable ascending aortic aneurysm measuring 4.1 cm (4.0 cm in 5/17). FU 1 year.    PAST SURGICAL HISTORY: Past Surgical History:  Procedure Laterality Date  . anal fissure surgery  1981  . CARDIAC CATHETERIZATION  2004   1 vessel CAD  . COLONOSCOPY  [id number]  . KNEE ARTHROSCOPY Left   . LASIK    . [place]   x2  . NASAL SINUS SURGERY  2001  . NASAL SINUS SURGERY  2002  . TOTAL KNEE ARTHROPLASTY Left 09/12/2017   Procedure: LEFT TOTAL KNEE ARTHROPLASTY;  Surgeon: Mcarthur Rossetti, MD;  Location: [facility];  Service: Orthopedics;  Laterality: Left;    FAMILY HISTORY: Family History  Problem Relation Age of Onset  . Heart disease Father   . Diabetes Unknown        GM    SOCIAL HISTORY: Social History   Socioeconomic History  . Marital status: Married    Spouse name: Not on file  . Number of children: Not on file  . Years of education: Not on file  . Highest education level: Not on file  Occupational History  . Not on file  Social Needs  . Financial resource strain: Not on file  . Food insecurity:    Worry: Not on file    Inability: Not on file  . Transportation needs:    Medical: Not on file    Non-medical:  Not on file  Tobacco Use  . Smoking status: Never Smoker  . Smokeless tobacco: Never Used  Substance and Sexual Activity  . Alcohol use: No  . Drug use: No  . Sexual activity: Not on file  Lifestyle  . Physical activity:    Days per week: Not on file    Minutes per session: Not on file  . Stress: Not on file  Relationships  . Social connections:    Talks on phone: Not on file    Gets together: Not on file    Attends religious service: Not on file    Active member of club or organization: Not on file    Attends meetings of clubs or organizations: Not on file    Relationship status: Not on file  . Intimate partner violence:    Fear of current or ex partner: Not on file    Emotionally abused: Not on file    Physically abused: Not on file    Forced sexual activity: Not on file  Other Topics Concern  . Not on file  Social History Narrative   Married; retired Agricultural consultant; daily caffeine use.  Lives with wife in [place]. Goes to the gym about 2 times a week.     PHYSICAL EXAM  Vitals:   11/26/17 0937  BP: (!) 158/92  Pulse: 66  Weight: 283 lb 3.2 oz (128.5 kg)  Height: 6\' 5"  (1.956 m)   Body mass index is 33.58 kg/m.  Generalized: Well developed, obese male in no acute distress  Head: normocephalic and atraumatic,. Oropharynx benign mallopatti 3 Neck: Supple, circumference 20  Lungs clear to auscultation Musculoskeletal: No deformity  Skin no peripheral edema, no rash Neurological examination   Mentation: Alert oriented to time, place, history taking. Attention span and concentration appropriate. Recent  and remote memory intact.  Follows all commands speech and language fluent.   Cranial nerve II-XII: Pupils were equal round reactive to light extraocular movements were full, visual field were full on confrontational test. Facial sensation and strength were normal. hearing was intact to finger rubbing bilaterally. Uvula tongue midline. head turning and shoulder shrug were  normal and symmetric.Tongue protrusion into cheek strength was normal. Motor: normal bulk and tone, full strength in the BUE, BLE, Sensory: normal and symmetric to light touch,  Coordination: finger-nose-finger, heel-to-shin bilaterally, no dysmetria Gait and Station: Rising up from seated position without assistance, normal stance,  moderate stride, good arm swing, smooth turning,  Tandem gait is steady  DIAGNOSTIC DATA (LABS, IMAGING, TESTING) - I reviewed patient records, labs, notes, testing and imaging myself where available.  Lab Results  Component Value Date   WBC 8.6 10/01/2017   HGB 14.0 10/01/2017   HCT 43.4 10/01/2017   MCV 87 10/01/2017   PLT 480 (H) 10/01/2017      Component Value Date/Time   NA 139 10/01/2017 0318   K 4.8 10/01/2017 0318   CL 101 10/01/2017 0318   CO2 19 (L) 10/01/2017 0318   GLUCOSE 117 (H) 10/01/2017 0318   GLUCOSE 168 (H) 09/13/2017 0505   BUN 20 10/01/2017 0318   CREATININE 1.12 10/01/2017 0318   CALCIUM 10.1 10/01/2017 0318   PROT 6.9 10/01/2017 0318   ALBUMIN 4.3 10/01/2017 0318   AST 21 10/01/2017 0318   ALT 31 10/01/2017 0318   ALKPHOS 94 10/01/2017 0318   BILITOT 0.5 10/01/2017 0318   GFRNONAA 67 10/01/2017 0318   GFRAA 78 10/01/2017 0318   Lab Results  Component Value Date   CHOL 144 08/21/2015   HDL 38 (L) 08/21/2015   LDLCALC 69 08/21/2015   TRIG 184 (H) 08/21/2015   CHOLHDL 3.8 08/21/2015    ASSESSMENT AND PLAN  67 y.o. year old male  has a past medical history of obstructive sleep apnea with new CPAP machine here for initial CPAP compliance. Data dated 10/26/2017-11/24/2017 shows compliance greater than 4 hours at 93%.  Average usage 7 hours 30 minutes.  Pressure 8 to 15 cm.  EPR level 3 AHI 0.5.  ESS 3.    PLAN: CPAP compliance 93% reviewed data with patient continue same settings Will order supplies Follow-up yearly Dennie Bible, [facility], [facility], APRN  [facility] [address] [place], [place] [address] [phone]

## 2017-11-26 ENCOUNTER — Ambulatory Visit: Payer: Medicare Other | Admitting: Nurse Practitioner

## 2017-11-26 ENCOUNTER — Encounter: Payer: Self-pay | Admitting: Nurse Practitioner

## 2017-11-26 VITALS — BP 160/78 | HR 74 | Ht 77.0 in | Wt 280.0 lb

## 2017-11-26 VITALS — BP 158/92 | HR 66 | Ht 77.0 in | Wt 283.2 lb

## 2017-11-26 DIAGNOSIS — I10 Essential (primary) hypertension: Secondary | ICD-10-CM

## 2017-11-26 DIAGNOSIS — G4733 Obstructive sleep apnea (adult) (pediatric): Secondary | ICD-10-CM | POA: Diagnosis not present

## 2017-11-26 DIAGNOSIS — I951 Orthostatic hypotension: Secondary | ICD-10-CM

## 2017-11-26 DIAGNOSIS — I2510 Atherosclerotic heart disease of native coronary artery without angina pectoris: Secondary | ICD-10-CM

## 2017-11-26 DIAGNOSIS — Z9989 Dependence on other enabling machines and devices: Secondary | ICD-10-CM

## 2017-11-26 DIAGNOSIS — I712 Thoracic aortic aneurysm, without rupture, unspecified: Secondary | ICD-10-CM

## 2017-11-26 DIAGNOSIS — E785 Hyperlipidemia, unspecified: Secondary | ICD-10-CM

## 2017-11-26 MED ORDER — OLMESARTAN MEDOXOMIL 20 MG PO TABS: 20.0000 mg | ORAL_TABLET | Freq: Every day | ORAL | 3 refills | Status: DC

## 2017-11-26 NOTE — Patient Instructions (Addendum)
We will be checking the following labs today - BMET, HPF and Lipids today  BMET in 2 weeks - do not need to fast   Medication Instructions:    Continue with your current medicines. BUT  I am adding back Benicar - take 20 mg daily - this is at your pharmacy    Testing/Procedures To Be Arranged:  N/A  Follow-Up:   See me in 3 to 4 months    Other Special Instructions:   Monitor BP over the next 2 weeks - bring your readings for me to review when you come back for lab    If you need a refill on your cardiac medications before your next appointment, please call your pharmacy.   Call the [place] office at [phone] if you have any questions, problems or concerns.

## 2017-11-26 NOTE — Progress Notes (Signed)
Orders for DME faxed to [facility]

## 2017-11-26 NOTE — Patient Instructions (Signed)
CPAP compliance 93%  continue same settings Will order supplies Follow-up yearly

## 2017-11-27 LAB — HEPATIC FUNCTION PANEL
ALT: 26 IU/L (ref 0–44)
AST: 14 IU/L (ref 0–40)
Albumin: 4.5 g/dL (ref 3.6–4.8)
Alkaline Phosphatase: 86 IU/L (ref 39–117)
Bilirubin Total: 0.6 mg/dL (ref 0.0–1.2)
Bilirubin, Direct: 0.14 mg/dL (ref 0.00–0.40)
Total Protein: 6.8 g/dL (ref 6.0–8.5)

## 2017-11-27 LAB — BASIC METABOLIC PANEL
BUN/Creatinine Ratio: 17 (ref 10–24)
BUN: 14 mg/dL (ref 8–27)
CO2: 23 mmol/L (ref 20–29)
Calcium: 9.7 mg/dL (ref 8.6–10.2)
Chloride: 104 mmol/L (ref 96–106)
Creatinine, Ser: 0.84 mg/dL (ref 0.76–1.27)
GFR calc Af Amer: 104 mL/min/{1.73_m2} (ref >59)
GFR calc non Af Amer: 90 mL/min/{1.73_m2} (ref >59)
Glucose: 110 mg/dL — ABNORMAL HIGH (ref 65–99)
Potassium: 4 mmol/L (ref 3.5–5.2)
Sodium: 143 mmol/L (ref 134–144)

## 2017-11-27 LAB — LIPID PANEL
Chol/HDL Ratio: 3.6 ratio (ref 0.0–5.0)
Cholesterol, Total: 129 mg/dL (ref 100–199)
HDL: 36 mg/dL — ABNORMAL LOW (ref >39)
LDL Calculated: 72 mg/dL (ref 0–99)
Triglycerides: 104 mg/dL (ref 0–149)
VLDL Cholesterol Cal: 21 mg/dL (ref 5–40)

## 2017-12-03 ENCOUNTER — Telehealth: Payer: Self-pay | Admitting: Nurse Practitioner

## 2017-12-03 ENCOUNTER — Other Ambulatory Visit: Payer: Medicare Other | Admitting: *Deleted

## 2017-12-03 DIAGNOSIS — I712 Thoracic aortic aneurysm, without rupture, unspecified: Secondary | ICD-10-CM

## 2017-12-03 DIAGNOSIS — I10 Essential (primary) hypertension: Secondary | ICD-10-CM

## 2017-12-03 DIAGNOSIS — E785 Hyperlipidemia, unspecified: Secondary | ICD-10-CM

## 2017-12-03 DIAGNOSIS — I951 Orthostatic hypotension: Secondary | ICD-10-CM

## 2017-12-03 DIAGNOSIS — I2510 Atherosclerotic heart disease of native coronary artery without angina pectoris: Secondary | ICD-10-CM

## 2017-12-03 LAB — BASIC METABOLIC PANEL
BUN/Creatinine Ratio: 16 (ref 10–24)
BUN: 14 mg/dL (ref 8–27)
CO2: 22 mmol/L (ref 20–29)
Calcium: 9.8 mg/dL (ref 8.6–10.2)
Chloride: 104 mmol/L (ref 96–106)
Creatinine, Ser: 0.87 mg/dL (ref 0.76–1.27)
GFR calc Af Amer: 103 mL/min/{1.73_m2} (ref >59)
GFR calc non Af Amer: 89 mL/min/{1.73_m2} (ref >59)
Glucose: 125 mg/dL — ABNORMAL HIGH (ref 65–99)
Potassium: 4.3 mmol/L (ref 3.5–5.2)
Sodium: 142 mmol/L (ref 134–144)

## 2017-12-03 NOTE — Telephone Encounter (Signed)
Walk In pt Form-Sealed envelope dropped off. Placed in [employer].

## 2017-12-08 ENCOUNTER — Other Ambulatory Visit: Payer: Self-pay | Admitting: *Deleted

## 2017-12-08 ENCOUNTER — Telehealth: Payer: Self-pay | Admitting: Nurse Practitioner

## 2017-12-08 MED ORDER — OLMESARTAN MEDOXOMIL 40 MG PO TABS: 40.0000 mg | ORAL_TABLET | Freq: Every day | ORAL | 1 refills | Status: DC

## 2017-12-08 NOTE — Telephone Encounter (Signed)
BP readings reviewed from home - BP still not at goal - lowest reading at 141/86 and highest BP is 157/92.  Let's increase the Benicar to 40 mg a day.   Continue to monitor BP.   Will see back as planned.   Burtis Junes, RN, [facility] [address] [place], [place]  [address] [phone]

## 2017-12-08 NOTE — Telephone Encounter (Signed)
S/w pt is aware of Lori's recommendations.  Will increase benicar one tablet by mouth (40 mg ) daily sent in today to pt's requested pharmacy, medication list update.  Will continue to monitor bp and if bp consistenly stays elevated to call office.  Pt is to keep appt with Cecille Rubin in Jan.  Pt stated did not trust bp cuff, was checked last ov visit.  Stated best brand is a omron, arm cuff if pt wants to purchase another one.

## 2017-12-31 ENCOUNTER — Other Ambulatory Visit: Payer: Self-pay | Admitting: *Deleted

## 2017-12-31 MED ORDER — METOPROLOL SUCCINATE ER 100 MG PO TB24: 100.0000 mg | ORAL_TABLET | Freq: Every day | ORAL | 3 refills | Status: DC

## 2017-12-31 NOTE — Telephone Encounter (Signed)
S/w pt is aware of treatment plan will increase Toprol XL to one tablet by mouth (100mg ) daily, medication list updated.  Pt sill come in on Nov 11 to see HTN clinic.  Discussed the importance of not using salt, keep bp diary and bring in to upcoming appt and new cuff.

## 2017-12-31 NOTE — Telephone Encounter (Signed)
Please see note by Mr. Bolls. His blood pressure is too high. PLAN:  1. Increase Toprol XL to 100 mg QD 2. Arrange follow up in the HTN clinic in ~ 2 weeks. Richardson Dopp, PA-C    12/31/2017 3:55 PM

## 2018-01-19 NOTE — Progress Notes (Signed)
Patient ID: James Rodgers                 DOB: 1950-06-01                      MRN: [id number]     HPI: James Rodgers is a 67 y.o. male patient of Truitt Merle, NP who presents today for hypertension evaluation. He was referred to hypertension clinic on 12/31/17. PMH significant for nonobstructive CAD (2003, on chronic DAPT), thoracic aortic aneurysm, HTN, HLD, and DM. Patient had been seen in the past to decrease antihypertensives due to severe orthostasis post knee surgery. Now, blood pressures are elevated once again. Most recently, Benicar has been increased to 40 mg daily and metoprolol succinate has been increased to 100 mg daily.   Patient arrives in good spirits, ambulating without assistance. Today, patient denies dizziness as it was in July (he felt like he would pass out). Now, dizziness only occurs if he bends over and his head is below his heart. Patient takes Benicar in the mornings, metoprolol in the evenings. Patient denies any adverse reactions to medications besides dizziness. He gets dizzy most times when it is hot and humid, when he plays golf and bends down for the ball.  He reports that he was put on minoxidil mostly for the side effect of hair growth. Patient monitors BP at home, tries to check every day as schedule allows. He checks his blood pressure in the the afternoon. He reports that he sweats a lot and does increase salt intake in the summers when he is losing a lot of water.   Compared home cuff readings to manual BP check in office - home cuff 157/97, in office 150/88.    Current HTN meds: Benicar 40 mg daily, metoprolol succinate 100 mg daily  Previously tried: irbesartan 150 mg daily, minoxidil 5 mg qhs, hydrochlorothiazide (pt thought It was short term), amlodipine (unknown why it was stopped), valsartan   BP goal: <130/80  Family History: diabetes (unknown relative), heart disease (father)  Social History: never smoker, denies alcohol or illicit drug  use  Diet: Most meals are from home and eat out (eats fast food sometimes). Patient adds salt to food sometimes, not as much as he used to. Eats lots of vegetables. Eats the "average amount" of meat. Eats average amounts of carbohydrates. Has one large cup coffee in the morning, drinks mostly water, will occasionally drink diet soda (~1 bottle in a day).   Exercise: Patient golfs fairly regularly   Home BP readings: since Toprol increased (01/01/18): 150s-160s/80s-90s (max 169/98, min 142/79), HR low-mid 60s   Wt Readings from Last 3 Encounters:  11/26/17 280 lb (127 kg)  11/26/17 283 lb 3.2 oz (128.5 kg)  11/19/17 283 lb (128.4 kg)   BP Readings from Last 3 Encounters:  01/20/18 (!) 150/88  11/26/17 (!) 160/78  11/26/17 (!) 158/92   Pulse Readings from Last 3 Encounters:  01/20/18 69  11/26/17 74  11/26/17 66    Renal function: CrCl cannot be calculated (Patient's most recent lab result is older than the maximum 21 days allowed.).  Past Medical History:  Diagnosis Date  . Anal fissure   . Arthritis   . Basal cell carcinoma    neck  . CAD (coronary artery disease)    a. 2003 Cath: LAD 50, D1 50-60, RCA 40;  b. 12/2008 MV: no ischemia/infarct, EF 63%. // c. MV 8/17: mild inf-sept and apical ischemia,  EF 53%, Low Risk  . DDD (degenerative disc disease), lumbar   . Diabetes type 2, controlled ([place])   . GERD (gastroesophageal reflux disease)   . Hemorrhoids   . History of dizziness   . History of nuclear stress test    a. Myoview 8/17: EF 53%, mild inferoseptal and mild apical ischemia, low risk  . HLD (hyperlipidemia)   . HTN (hypertension)   . LBP (low back pain)    a. h/o surgery in 1991.  . Obesity   . Orthostatic lightheadedness    with heat exposure  . Osteoarthritis    knees  . Perineal abscess 09/2012  . Pneumonia 09/2005  . PONV (postoperative nausea and vomiting)   . Renal cyst, right   . Retinopathy    Mild, left eye  . Right ureteral stone   .  Seasonal allergic rhinitis   . Sleep apnea    a. on cpap.  [name] Spondylosis    lumbar  . Thoracic aortic aneurysm ([place])    a. MRA 5/17 ascending thoracic aorta measuring 40 mm - recommend annual follow-up // b.  Echo 5/17: Moderate concentric LVH, EF 55-60%, normal wall motion, grade 1 diastolic dysfunction, ascending aortic diameter 45 mm, mild MR, mild LAE // c. Chest MRA 6/18: stable ascending aortic aneurysm measuring 4.1 cm (4.0 cm in 5/17). FU 1 year.    Current Outpatient Medications on File Prior to Visit  Medication Sig Dispense Refill  . aspirin EC 81 MG tablet Take 81 mg by mouth at bedtime.    [name] atorvastatin (LIPITOR) 80 MG tablet Take 80 mg by mouth at bedtime.    . clopidogrel (PLAVIX) 75 MG tablet Take 75 mg by mouth daily.    [name] docusate sodium (COLACE) 100 MG capsule Take 100 mg by mouth at bedtime.    [name] esomeprazole (NEXIUM) 40 MG capsule Take 40 mg by mouth daily.      . fexofenadine (ALLEGRA) 180 MG tablet Take 180 mg by mouth daily as needed (for allergies).     . FIBER PO Take 1 capsule by mouth 2 (two) times daily.    . fluticasone (FLONASE) 50 MCG/ACT nasal spray Place 2 sprays into both nostrils daily as needed for allergies or rhinitis.    . metFORMIN (GLUCOPHAGE-XR) 500 MG 24 hr tablet Take 500 mg by mouth 2 (two) times daily.     . metoprolol succinate (TOPROL-XL) 100 MG 24 hr tablet Take 1 tablet (100 mg total) by mouth daily. Take with or immediately following a meal. 90 tablet 3  . olmesartan (BENICAR) 40 MG tablet Take 1 tablet (40 mg total) by mouth daily. 90 tablet 1  . tamsulosin (FLOMAX) 0.4 MG CAPS capsule Take 0.4 mg by mouth at bedtime.      No current facility-administered medications on file prior to visit.     Allergies  Allergen Reactions  . Codeine Other (See Comments)    dizzy    Blood pressure (!) 150/88, pulse 69, SpO2 97 %.   Assessment/Plan: Hypertension: Blood pressures in office and at home are elevated. Comparison of home cuff to in  office readings are appropriate. Benicar and metoprolol are maximized, with heart rate running relatively low. Will start chlorthalidone 25mg  daily in the mornings. Counseled that he should maintain adequate hydration while on chlorthalidone, especially when it is hot and humid out and he is sweating a lot. Discussed effect of sodium on pressures, but since patient has experienced significant dizziness in the past,  do not recommend sodium restriction beyond what his current intake is at this time. Follow up with hypertension clinic in 4 weeks for labs and repeat pressure check.   Patient seen with Cleotis Lema, PharmD Student   Thank you, Lelan Pons. Patterson Hammersmith, [facility]  01/20/2018 9:35 AM

## 2018-01-20 ENCOUNTER — Ambulatory Visit (INDEPENDENT_AMBULATORY_CARE_PROVIDER_SITE_OTHER): Payer: Medicare Other | Admitting: Pharmacist

## 2018-01-20 VITALS — BP 150/88 | HR 69

## 2018-01-20 DIAGNOSIS — I10 Essential (primary) hypertension: Secondary | ICD-10-CM

## 2018-01-20 MED ORDER — CHLORTHALIDONE 25 MG PO TABS: 25.0000 mg | ORAL_TABLET | Freq: Every day | ORAL | 3 refills | Status: DC

## 2018-01-20 NOTE — Patient Instructions (Signed)
Return for a a follow up appointment in 4 weeks, on 02/17/18 at 9 AM  Your blood pressure today is 150/88   Check your blood pressure at home daily and keep record of the readings, as you are doing currently. Your blood pressure cuff reading was comparable to our in office readings, so it is accurate.  Take your BP meds as follows:  - CONTINUE Benicar 40 mg daily and metoprolol succinate 100 mg daily.  - START chlorthalidone 25 mg daily, in the mornings. Make sure to stay hydrated with this medication, especially if it is hot or humid out. A prescription has been sent to [facility].    Bring all of your meds, your BP cuff and your record of home blood pressures to your next appointment.  Exercise as you're able, try to walk approximately 30 minutes per day.  Keep salt intake to a minimum, especially watch canned and prepared boxed foods.  Eat more fresh fruits and vegetables and fewer canned items.  Avoid eating in fast food restaurants.    HOW TO TAKE YOUR BLOOD PRESSURE: . Rest 5 minutes before taking your blood pressure. .  Don't smoke or drink caffeinated beverages for at least 30 minutes before. . Take your blood pressure before (not after) you eat. . Sit comfortably with your back supported and both feet on the floor (don't cross your legs). . Elevate your arm to heart level on a table or a desk. . Use the proper sized cuff. It should fit smoothly and snugly around your bare upper arm. There should be enough room to slip a fingertip under the cuff. The bottom edge of the cuff should be 1 inch above the crease of the elbow. . Ideally, take 3 measurements at one sitting and record the average.

## 2018-02-11 NOTE — Telephone Encounter (Signed)
Received pt's my chart message about hypotension, s/w Eino Farber, Pharm-D since pt sees pharmacist next week and has in the past about bp issues.  Since pt started the farxiga stated pt is over diuresed.  D/C chlorthalidone and keep upcoming appt next week. Pt is to keep track of bp readings and bring next week.  If pt consistently stays low in the next several days after medication change to call office.

## 2018-02-17 ENCOUNTER — Other Ambulatory Visit: Payer: Medicare Other

## 2018-02-17 ENCOUNTER — Ambulatory Visit (INDEPENDENT_AMBULATORY_CARE_PROVIDER_SITE_OTHER): Payer: Medicare Other | Admitting: Pharmacist

## 2018-02-17 ENCOUNTER — Encounter: Payer: Self-pay | Admitting: Pharmacist

## 2018-02-17 VITALS — BP 112/68 | HR 92

## 2018-02-17 DIAGNOSIS — I10 Essential (primary) hypertension: Secondary | ICD-10-CM | POA: Diagnosis not present

## 2018-02-17 LAB — BASIC METABOLIC PANEL
BUN/Creatinine Ratio: 22 (ref 10–24)
BUN: 27 mg/dL (ref 8–27)
CALCIUM: 10.1 mg/dL (ref 8.6–10.2)
CO2: 21 mmol/L (ref 20–29)
CREATININE: 1.22 mg/dL (ref 0.76–1.27)
Chloride: 102 mmol/L (ref 96–106)
GFR calc Af Amer: 70 mL/min/{1.73_m2} (ref >59)
GFR, EST NON AFRICAN AMERICAN: 61 mL/min/{1.73_m2} (ref >59)
Glucose: 179 mg/dL — ABNORMAL HIGH (ref 65–99)
Potassium: 4.5 mmol/L (ref 3.5–5.2)
Sodium: 141 mmol/L (ref 134–144)

## 2018-02-17 MED ORDER — METOPROLOL SUCCINATE ER 25 MG PO TB24: 25.0000 mg | ORAL_TABLET | Freq: Every day | ORAL | 0 refills | Status: DC

## 2018-02-17 NOTE — Progress Notes (Signed)
Patient ID: James Rodgers                 DOB: [birth year]                      MRN: [id number]     HPI: James Rodgers is a 67 y.o. male patient of Truitt Merle, NP who presents today for hypertension evaluation. He was referred to hypertension clinic on 12/31/17. PMH significant for nonobstructive CAD (2003, on chronic DAPT), thoracic aortic aneurysm, HTN, HLD, and DM. Patient had been seen in the past to decrease antihypertensives due to severe orthostasis post knee surgery. Now, blood pressures are elevated once again. Most recently, he was started on chlorthalidone 25mg  daily, which was discontinued after the was started on Farxiga due to over diuresis.   Patient arrives in good spirits, ambulating without assistance. He states that he has started to feel slightly better after last week when his blood pressure was so low. He states that our office stopped the chlorthalidone and his primary doctor told him to cut back on his Toprol to 25mg  daily. He has been doing both since 12/4 and his pressure seem to be coming back up. He states that he feels his best when his pressures run in the 140s, but he knows this is not ideal.   Current HTN meds: Benicar 40 mg daily, metoprolol succinate 25 mg daily  Previously tried: irbesartan 150 mg daily, minoxidil 5 mg qhs, hydrochlorothiazide (pt thought It was short term), amlodipine (unknown why it was stopped), valsartan   BP goal: <130/80  Family History: diabetes (unknown relative), heart disease (father)  Social History: never smoker, denies alcohol or illicit drug use  Diet: Most meals are from home and eat out (eats fast food sometimes). Patient adds salt to food sometimes, not as much as he used to. Eats lots of vegetables. Eats the "average amount" of meat. Eats average amounts of carbohydrates. Has one large cup coffee in the morning, drinks mostly water, will occasionally drink diet soda (~1 bottle in a day).   Exercise: Patient golfs  fairly regularly   Home BP readings: Lowest: 85/59 Highest in last 2 weeks: 127/80   Wt Readings from Last 3 Encounters:  11/26/17 280 lb (127 kg)  11/26/17 283 lb 3.2 oz (128.5 kg)  11/19/17 283 lb (128.4 kg)   BP Readings from Last 3 Encounters:  02/17/18 112/68  01/20/18 (!) 150/88  11/26/17 (!) 160/78   Pulse Readings from Last 3 Encounters:  02/17/18 92  01/20/18 69  11/26/17 74    Renal function: CrCl cannot be calculated (Unknown ideal weight.).  Past Medical History:  Diagnosis Date  . Anal fissure   . Arthritis   . Basal cell carcinoma    neck  . CAD (coronary artery disease)    a. 2003 Cath: LAD 50, D1 50-60, RCA 40;  b. 12/2008 MV: no ischemia/infarct, EF 63%. // c. MV 8/17: mild inf-sept and apical ischemia, EF 53%, Low Risk  . DDD (degenerative disc disease), lumbar   . Diabetes type 2, controlled ([place])   . GERD (gastroesophageal reflux disease)   . Hemorrhoids   . History of dizziness   . History of nuclear stress test    a. Myoview 8/17: EF 53%, mild inferoseptal and mild apical ischemia, low risk  . HLD (hyperlipidemia)   . HTN (hypertension)   . LBP (low back pain)    a. h/o surgery in 1991.  [name]  Obesity   . Orthostatic lightheadedness    with heat exposure  . Osteoarthritis    knees  . Perineal abscess 09/2012  . Pneumonia 09/2005  . PONV (postoperative nausea and vomiting)   . Renal cyst, right   . Retinopathy    Mild, left eye  . Right ureteral stone   . Seasonal allergic rhinitis   . Sleep apnea    a. on cpap.  [name] Spondylosis    lumbar  . Thoracic aortic aneurysm ([place])    a. MRA 5/17 ascending thoracic aorta measuring 40 mm - recommend annual follow-up // b.  Echo 5/17: Moderate concentric LVH, EF 55-60%, normal wall motion, grade 1 diastolic dysfunction, ascending aortic diameter 45 mm, mild MR, mild LAE // c. Chest MRA 6/18: stable ascending aortic aneurysm measuring 4.1 cm (4.0 cm in 5/17). FU 1 year.    Current Outpatient  Medications on File Prior to Visit  Medication Sig Dispense Refill  . aspirin EC 81 MG tablet Take 81 mg by mouth at bedtime.    [name] atorvastatin (LIPITOR) 80 MG tablet Take 80 mg by mouth at bedtime.    . clopidogrel (PLAVIX) 75 MG tablet Take 75 mg by mouth daily.    [name] docusate sodium (COLACE) 100 MG capsule Take 100 mg by mouth at bedtime.    [name] esomeprazole (NEXIUM) 40 MG capsule Take 40 mg by mouth daily.      . fexofenadine (ALLEGRA) 180 MG tablet Take 180 mg by mouth daily as needed (for allergies).     . FIBER PO Take 1 capsule by mouth 2 (two) times daily.    . fluticasone (FLONASE) 50 MCG/ACT nasal spray Place 2 sprays into both nostrils daily as needed for allergies or rhinitis.    . metFORMIN (GLUCOPHAGE-XR) 500 MG 24 hr tablet Take 500 mg by mouth 2 (two) times daily.     [name] olmesartan (BENICAR) 40 MG tablet Take 1 tablet (40 mg total) by mouth daily. 90 tablet 1  . tamsulosin (FLOMAX) 0.4 MG CAPS capsule Take 0.4 mg by mouth at bedtime.      No current facility-administered medications on file prior to visit.     Allergies  Allergen Reactions  . Codeine Other (See Comments)    dizzy    Blood pressure 112/68, pulse 92.   Assessment/Plan: Hypertension: BP today well within goal. Will continue current medications as prescribed. Advised that he monitor pressures and call with additional lows. I anticipate that his pressures will continue to increase slightly over the new few days after stopping chlorthalidone and decreasing Toprol. Since his is now on Farxiga, I would avoid addition of another diuretic as I believe this will drop his pressure significantly again. If pressure do increase above goal would prefer titration Toprol. Advised he continue to monitor and bring log to follow up with Truitt Merle, NP in January. Follow up in HTN clinic if needed for additional medication management.   Thank you, Lelan Pons. Patterson Hammersmith, [facility]  02/17/2018 9:56  PM  ADDENDUM: slight bump in Scr. Would recommend repeat with follow up with Truitt James Rodgers - note added to visit

## 2018-02-17 NOTE — Patient Instructions (Addendum)
Return for a follow up appointment as scheduled with James Rodgers.   Check your blood pressure at home daily (if able) and keep record of the readings.  Take your BP meds as follows: CONTINUE all blood pressure medications as prescribed  Bring all of your meds, your BP cuff and your record of home blood pressures to your next appointment.  Exercise as you're able, try to walk approximately 30 minutes per day.  Keep salt intake to a minimum, especially watch canned and prepared boxed foods.  Eat more fresh fruits and vegetables and fewer canned items.  Avoid eating in fast food restaurants.    HOW TO TAKE YOUR BLOOD PRESSURE: . Rest 5 minutes before taking your blood pressure. .  Don't smoke or drink caffeinated beverages for at least 30 minutes before. . Take your blood pressure before (not after) you eat. . Sit comfortably with your back supported and both feet on the floor (don't cross your legs). . Elevate your arm to heart level on a table or a desk. . Use the proper sized cuff. It should fit smoothly and snugly around your bare upper arm. There should be enough room to slip a fingertip under the cuff. The bottom edge of the cuff should be 1 inch above the crease of the elbow. . Ideally, take 3 measurements at one sitting and record the average.

## 2018-02-27 ENCOUNTER — Encounter: Payer: Self-pay | Admitting: Nurse Practitioner

## 2018-03-24 NOTE — Progress Notes (Signed)
CARDIOLOGY OFFICE NOTE  Date:  03/25/2018    James Rodgers Date of Birth: January 22, 1951 Medical Record #[id number]  PCP:  Leanna Battles, MD  Cardiologist:  Servando Snare     Chief Complaint  Patient presents with  . Hypertension  . Follow-up    Follow up visit    History of Present Illness: James Rodgers is a 68 y.o. male who presents today for a follow up visit. Seen for Dr. Saunders Revel. He is a former patient of Dr. Susa Simmonds as well as Dr. Claris Gladden. He would like to follow with me going forward.  He has a history ofmoderate nonobstructive coronary artery disease by catheterization in 2003, thoracic aortic aneurysm,hypertension,hyperlipidemia,and diabetes mellitus. He has been on chronic DAPT as part of his medical management of his CAD.   Last seen by Dr. Saunders Revel in March of 2019 - felt to be doing ok. Some vague chest pain - but this has been chronic since his remote cath in 2003. He had noted some orthostasis while outside in the heat. BP was marginal at his visit but no medicine changes due to the occasional orthostasis.  Isaw him back in July several times - he was very orthostatic following his knee surgery and in the setting of significant weight loss. Lots of medicines were stopped. I was able to wean off Midodrine. He ended up doing well - BP started tracking back up - hwe have had to restart medicines. He has been followed in the HTN clinic as well.   Last visit with me back in September - last seen in HTN clinic in December.   Current HTN meds: Benicar 40 mg daily, metoprolol succinate 25 mg daily  Previously tried: irbesartan 150 mg daily, minoxidil 5 mg qhs, hydrochlorothiazide (pt thought It was short term), amlodipine (unknown why it was stopped), valsartan   Comes in today. Here alone. I saw him last week when he was here with his mom - she is 84. Montell feels good. Actually better than he has in some time. More energy. He has gained some weight over the  holidays - he knows he needs to be working on that. No chest pain. Breathing is fine. Not dizzy or lightheaded anymore. Really likes being on less Toprol. His BP cuff is brought in today - it does not correlate.   Past Medical History:  Diagnosis Date  . Anal fissure   . Arthritis   . Basal cell carcinoma    neck  . CAD (coronary artery disease)    a. 2003 Cath: LAD 50, D1 50-60, RCA 40;  b. 12/2008 MV: no ischemia/infarct, EF 63%. // c. MV 8/17: mild inf-sept and apical ischemia, EF 53%, Low Risk  . DDD (degenerative disc disease), lumbar   . Diabetes type 2, controlled ([place])   . GERD (gastroesophageal reflux disease)   . Hemorrhoids   . History of dizziness   . History of nuclear stress test    a. Myoview 8/17: EF 53%, mild inferoseptal and mild apical ischemia, low risk  . HLD (hyperlipidemia)   . HTN (hypertension)   . LBP (low back pain)    a. h/o surgery in 1991.  . Obesity   . Orthostatic lightheadedness    with heat exposure  . Osteoarthritis    knees  . Perineal abscess 09/2012  . Pneumonia 09/2005  . PONV (postoperative nausea and vomiting)   . Renal cyst, right   . Retinopathy    Mild, left eye  .  Right ureteral stone   . Seasonal allergic rhinitis   . Sleep apnea    a. on cpap.  James Rodgers Spondylosis    lumbar  . Thoracic aortic aneurysm ([place])    a. MRA 5/17 ascending thoracic aorta measuring 40 mm - recommend annual follow-up // b.  Echo 5/17: Moderate concentric LVH, EF 55-60%, normal wall motion, grade 1 diastolic dysfunction, ascending aortic diameter 45 mm, mild MR, mild LAE // c. Chest MRA 6/18: stable ascending aortic aneurysm measuring 4.1 cm (4.0 cm in 5/17). FU 1 year.    Past Surgical History:  Procedure Laterality Date  . anal fissure surgery  1981  . CARDIAC CATHETERIZATION  2004   1 vessel CAD  . COLONOSCOPY  1937,9024  . KNEE ARTHROSCOPY Left   . LASIK    . [place]   x2  . NASAL SINUS SURGERY  2001  . NASAL SINUS SURGERY  2002   . TOTAL KNEE ARTHROPLASTY Left 09/12/2017   Procedure: LEFT TOTAL KNEE ARTHROPLASTY;  Surgeon: Mcarthur Rossetti, MD;  Location: [facility];  Service: Orthopedics;  Laterality: Left;     Medications: Current Meds  Medication Sig  . aspirin EC 81 MG tablet Take 81 mg by mouth at bedtime.  James Rodgers atorvastatin (LIPITOR) 80 MG tablet Take 80 mg by mouth at bedtime.  . clopidogrel (PLAVIX) 75 MG tablet Take 75 mg by mouth daily.  James Rodgers docusate sodium (COLACE) 100 MG capsule Take 100 mg by mouth at bedtime.  James Rodgers esomeprazole (NEXIUM) 40 MG capsule Take 40 mg by mouth daily.    . fexofenadine (ALLEGRA) 180 MG tablet Take 180 mg by mouth daily as needed (for allergies).   . FIBER PO Take 1 capsule by mouth 2 (two) times daily.  . fluticasone (FLONASE) 50 MCG/ACT nasal spray Place 2 sprays into both nostrils daily as needed for allergies or rhinitis.  . metFORMIN (GLUCOPHAGE-XR) 500 MG 24 hr tablet Take 500 mg by mouth 2 (two) times daily.   . metoprolol succinate (TOPROL-XL) 25 MG 24 hr tablet Take 1 tablet (25 mg total) by mouth daily. Take with or immediately following a meal.  . olmesartan (BENICAR) 40 MG tablet Take 1 tablet (40 mg total) by mouth daily.  . tamsulosin (FLOMAX) 0.4 MG CAPS capsule Take 0.4 mg by mouth at bedtime.   . [DISCONTINUED] metoprolol succinate (TOPROL-XL) 25 MG 24 hr tablet Take 1 tablet (25 mg total) by mouth daily. Take with or immediately following a meal.     Allergies: Allergies  Allergen Reactions  . Codeine Other (See Comments)    dizzy    Social History: The patient  reports that he has never smoked. He has never used smokeless tobacco. He reports that he does not drink alcohol or use drugs.   Family History: The patient's family history includes Diabetes in an other family member; Heart disease in his father.   Review of Systems: Please see the history of present illness.   Otherwise, the review of systems is positive for none.   All other systems are  reviewed and negative.   Physical Exam: VS:  BP 132/82 (BP Location: Left Arm, Patient Position: Sitting, Cuff Size: Large)   Pulse 75   Ht 6\' 5"  (1.956 m)   Wt 289 lb 12.8 oz (131.5 kg)   SpO2 96% Comment: at rest  BMI 34.37 kg/m  .  BMI Body mass index is 34.37 kg/m.  Wt Readings from Last 3 Encounters:  03/25/18 289 lb 12.8 oz (131.5 kg)  11/26/17 280 lb (127 kg)  11/26/17 283 lb 3.2 oz (128.5 kg)   His BP is 130/80 in the right arm, 130/82 in the left and his machine read 145/98  General: Pleasant. Well developed, well nourished and in no acute distress.  He has gained weight.  HEENT: Normal.  Neck: Supple, no JVD, carotid bruits, or masses noted.  Cardiac: Regular rate and rhythm. No murmurs, rubs, or gallops. No edema.  Respiratory:  Lungs are clear to auscultation bilaterally with normal work of breathing.  GI: Soft and nontender.  MS: No deformity or atrophy. Gait and ROM intact.  Skin: Warm and dry. Color is normal.  Neuro:  Strength and sensation are intact and no gross focal deficits noted.  Psych: Alert, appropriate and with normal affect.   LABORATORY DATA:  EKG:  EKG is not ordered today.  Lab Results  Component Value Date   WBC 8.6 10/01/2017   HGB 14.0 10/01/2017   HCT 43.4 10/01/2017   PLT 480 (H) 10/01/2017   GLUCOSE 179 (H) 02/17/2018   CHOL 129 11/26/2017   TRIG 104 11/26/2017   HDL 36 (L) 11/26/2017   LDLCALC 72 11/26/2017   ALT 26 11/26/2017   AST 14 11/26/2017   NA 141 02/17/2018   K 4.5 02/17/2018   CL 102 02/17/2018   CREATININE 1.22 02/17/2018   BUN 27 02/17/2018   CO2 21 02/17/2018   TSH 4.170 10/01/2017     BNP (last 3 results) No results for input(s): BNP in the last 8760 hours.  ProBNP (last 3 results) No results for input(s): PROBNP in the last 8760 hours.   Other Studies Reviewed Today:  MRA CHESTFINDINGS6/2019: Maximal diameters of the ascending aorta at the sinus of Valsalva, sino-tubular junction, and ascending  aorta are 4.1 cm, 3.5 cm, and 4.1 cm respectively. This compares with a maximal diameter of 4.1 cm in the ascending aorta on the prior study. There is no evidence of dissection. There is no evidence of acute intramural hematoma. The great vessels are patent including the vertebral arteries, within the confines of the examination.  No obvious mediastinal mass. Small nodule in the right middle lobe on image 39 of series 11 is stable. No acute findings in the abdomen.  IMPRESSION: Maximal diameter of the ascending aorta is stable at 4.1 cm. Recommend annual imaging followup by CTA or MRA. This recommendation follows 2010 ACCF/AHA/AATS/ACR/ASA/SCA/SCAI/SIR/STS/SVM Guidelines for the Diagnosis and Management of Patients with Thoracic Aortic Disease. Circulation. 2010; 121: J884-Z660   Electronically Signed By: Marybelle Killings M.D. On: 08/12/2017 08:45    MyoviewStudy Highlights8/2017    The left ventricular ejection fraction is mildly decreased (45-54%).  Nuclear stress EF: 53%.  There was no ST segment deviation noted during stress.  Defect 1: There is a small defect of moderate severity present in the basal inferoseptal and mid inferoseptal location.  Defect 2: There is a small defect of mild severity present in the apex location.  This is a low risk study.  Low risk stress nuclear study with mild ischemia in the basal inferoseptal wall and mild ischemia in the apical wall; EF 53 with mild global hypokinesis and mild LVE.   EchoStudy Conclusions5/2017  - Left ventricle: The cavity size was normal. There was moderate concentric hypertrophy. Systolic function was normal. The estimated ejection fraction was in the range of 55% to 60%. Wall motion was normal; there were no regional wall motion abnormalities. Doppler parameters are  consistent with abnormal left ventricular relaxation (grade 1 diastolic dysfunction). - Aortic valve: Trileaflet;  mildly thickened, mildly calcified leaflets. - Aorta: Ascending aortic diameter: 45 mm (S). - Mitral valve: There was mild regurgitation. - Left atrium: The atrium was mildly dilated. - Right ventricle: The cavity size was mildly dilated. Wall thickness was normal  Assessment/Plan:  1. Prior orthostasis - this is resolved.  2. CAD - nonobstructive - he remains on chronic DAPT therapy - he has done this for many years and wishes to continue - he had low risk Myoview from 2017. Hehas no active chest pain. Favor continue CV risk factor modification. He knows he needs to get back on track with this.   3. Thoracic aneurysm - will need monitoring- next study in June of 2020.We have discussed this at past visit. Precautions given in the past. His BP is controlled by my checks here today.   4. HTN - BP is fine by me today. His cuff does not correlate. He feels very well clinically with less Toprol. No longer on chlorthalidone as well. I have opted to leave him on his current regimen going forward. He will get a new cuff.  Noted by pharmacy that since his is now on Farxiga, we should avoid the addition of another diuretic as it is felt this will drop his pressure significantly again.  5. HLD - on statin.  Current medicines are reviewed with the patient today.  The patient does not have concerns regarding medicines other than what has been noted above.  The following changes have been made:  See above.  Labs/ tests ordered today include:   No orders of the defined types were placed in this encounter.    Disposition:   FU with me in 6 months.   Patient is agreeable to this plan and will call if any problems develop in the interim.   SignedTruitt Merle, NP  03/25/2018 10:57 AM  [facility] [address] [place], [place]  [address] Phone: [phone] Fax: [phone]

## 2018-03-25 ENCOUNTER — Ambulatory Visit: Payer: Medicare Other | Admitting: Nurse Practitioner

## 2018-03-25 ENCOUNTER — Encounter: Payer: Self-pay | Admitting: Nurse Practitioner

## 2018-03-25 VITALS — BP 132/82 | HR 75 | Ht 77.0 in | Wt 289.8 lb

## 2018-03-25 DIAGNOSIS — I712 Thoracic aortic aneurysm, without rupture, unspecified: Secondary | ICD-10-CM

## 2018-03-25 DIAGNOSIS — I2510 Atherosclerotic heart disease of native coronary artery without angina pectoris: Secondary | ICD-10-CM

## 2018-03-25 DIAGNOSIS — E785 Hyperlipidemia, unspecified: Secondary | ICD-10-CM | POA: Diagnosis not present

## 2018-03-25 DIAGNOSIS — I10 Essential (primary) hypertension: Secondary | ICD-10-CM

## 2018-03-25 MED ORDER — METOPROLOL SUCCINATE ER 25 MG PO TB24: 25.0000 mg | ORAL_TABLET | Freq: Every day | ORAL | 3 refills | Status: DC

## 2018-03-25 NOTE — Patient Instructions (Addendum)
We will be checking the following labs today - NONE   Medication Instructions:    Continue with your current medicines.    If you need a refill on your cardiac medications before your next appointment, please call your pharmacy.     Testing/Procedures To Be Arranged:  N/A  Follow-Up:   See me in 6 months    At [facility], you and your health needs are our priority.  As part of our continuing mission to provide you with exceptional heart care, we have created designated Provider Care Teams.  These Care Teams include your primary Cardiologist (physician) and Advanced Practice Providers (APPs -  Physician Assistants and Nurse Practitioners) who all work together to provide you with the care you need, when you need it.  Special Instructions:  . Get a new BP machine.   Call the [place] office at [phone] if you have any questions, problems or concerns.

## 2018-05-21 ENCOUNTER — Encounter (INDEPENDENT_AMBULATORY_CARE_PROVIDER_SITE_OTHER): Payer: Self-pay | Admitting: Orthopaedic Surgery

## 2018-05-21 ENCOUNTER — Ambulatory Visit (INDEPENDENT_AMBULATORY_CARE_PROVIDER_SITE_OTHER): Payer: Medicare Other

## 2018-05-21 ENCOUNTER — Ambulatory Visit (INDEPENDENT_AMBULATORY_CARE_PROVIDER_SITE_OTHER): Payer: Medicare Other | Admitting: Orthopaedic Surgery

## 2018-05-21 ENCOUNTER — Other Ambulatory Visit: Payer: Self-pay

## 2018-05-21 DIAGNOSIS — Z96652 Presence of left artificial knee joint: Secondary | ICD-10-CM | POA: Diagnosis not present

## 2018-05-21 NOTE — Progress Notes (Signed)
The patient is now 8 months status post a left total knee arthroplasty.  He is 68 years old.  He is back to playing golf.  He says the knee is doing well.  He gets some soreness if he sitting too long but he can do all his normal activities.  Sometimes he feels as if there is a slight instability to it.  On exam his range of motion is entirely full.  He does have just some slight play in his drawer sign.  X-rays show well-seated implant with no complicating features.  The incision on his left knee is healed nicely and his calf is soft.  There is minimal swelling.  At this point we will continue increase his activities and work on quad strengthening exercises.  We will see him back in 6 months from now with a repeat 2 views of his left knee.  If he continues to have any symptoms concerning for instability at all he will let us know and I talked about the possibility of a poly-exchange only as needed but I do not know fully to have to have that route based on how he is doing.  He says he is playing golf fine without any issues at all and he does not even think about it.

## 2018-06-23 IMAGING — MR MR MRA CHEST W/ OR W/O CM
10 series · 16 of 16 positions shown · IV contrast (multihance)
Comparison: 08/02/2015

CLINICAL DATA: Follow-up ascending aortic aneurysm

EXAM:
MRA CHEST WITH CONTRAST
TECHNIQUE: Multiplanar, multiecho pulse sequences of the chest were obtained
with intravenous contrast. Angiographic images of chest were
obtained using MRA technique with intravenous contrast.
CONTRAST:  20mL MULTIHANCE GADOBENATE DIMEGLUMINE 529 MG/ML IV SOLN

[Series 3: bSSFP · axial · 8.0mm · 0.86mm/px · 1 of 33 slices shown (1 of 3)]
[im 1/33]
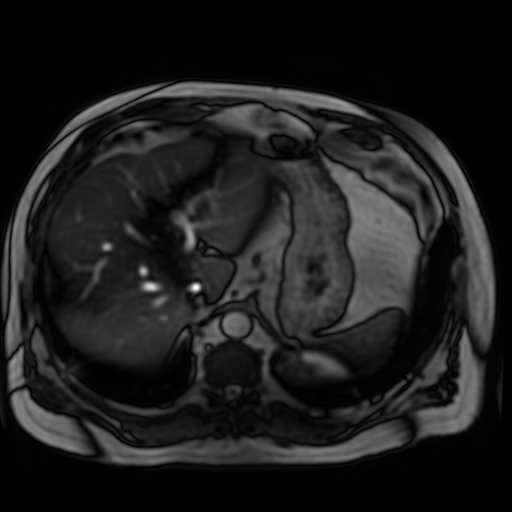

[Series 5: bSSFP · coronal · 8.0mm · 0.86mm/px · 1 of 26 slices shown (2 of 3)]
[im 1/26]
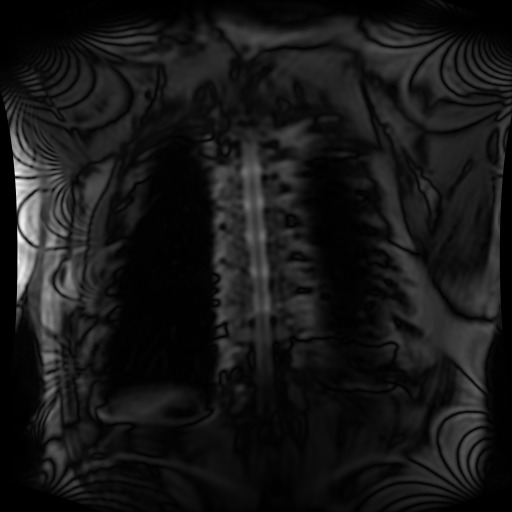

[Series 6: T1 · oblique · 8.0mm · 0.82mm/px · 1 of 5 slices shown]
[im 1/5]
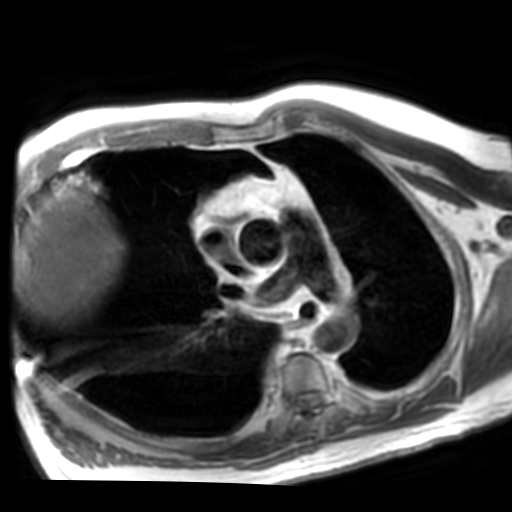

[Series 8: bSSFP · sagittal · 8.0mm · 0.78mm/px · 4 of 160 slices shown (3 of 3)]
[im 1/160]
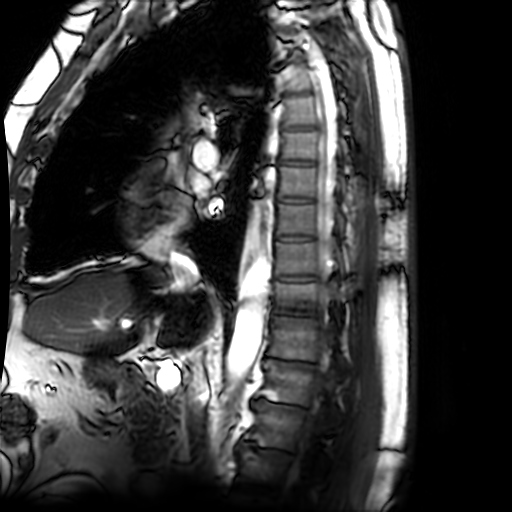
[im 54/160]
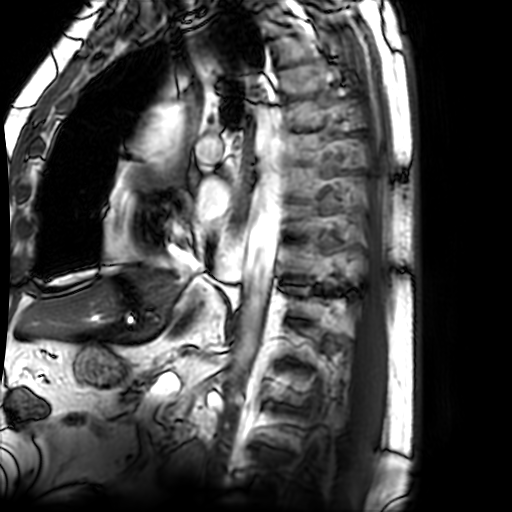
[im 107/160]
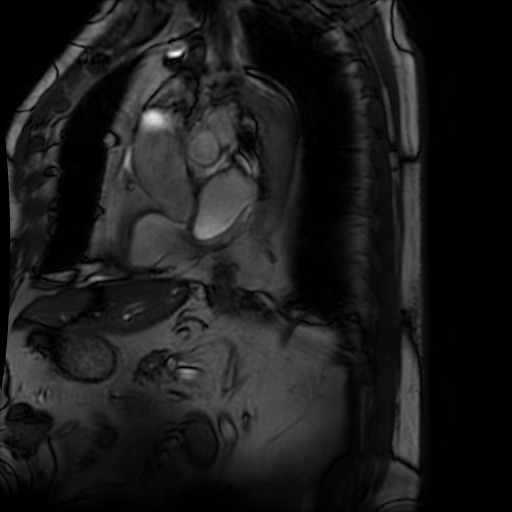
[im 160/160]
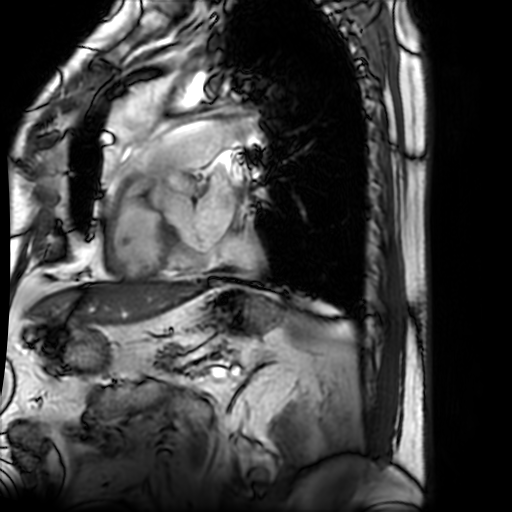

[Series 11: T1 dynamic · axial · 5.0mm · 0.86mm/px · z∈[-104,+150]mm · 2 of 52 slices shown (1 of 2)]
[im 1/52]
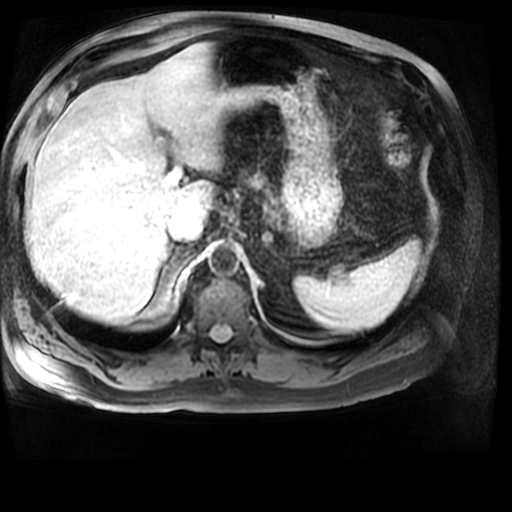
[im 52/52]
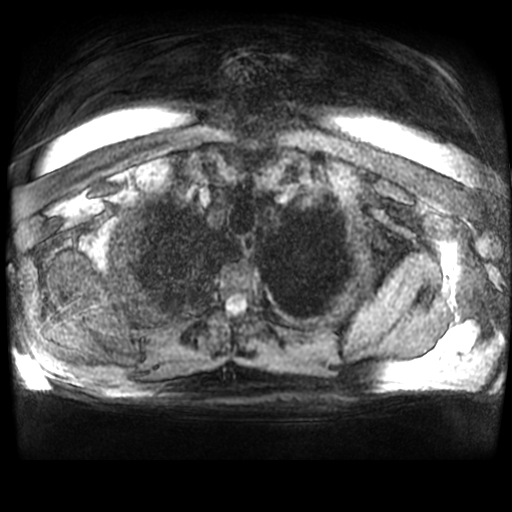

[Series 12: T1 dynamic · coronal · 4.0mm · 0.90mm/px · 1 of 44 slices shown (2 of 2)]
[im 1/44]
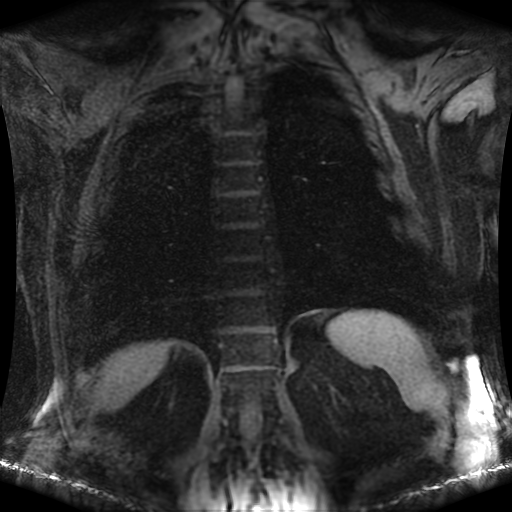

[Series 15: T1 dynamic post-contrast · axial · 5.0mm · 0.86mm/px · z∈[-104,+150]mm · 2 of 52 slices shown (1 of 2)]
[im 1/52]
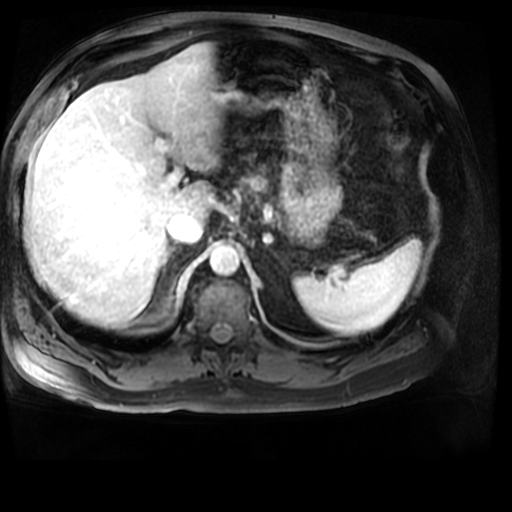
[im 52/52]
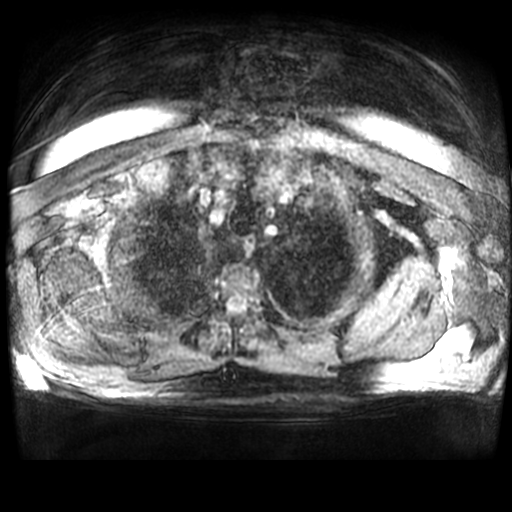

[Series 16: T1 dynamic post-contrast · coronal · 4.0mm · 0.90mm/px · 1 of 44 slices shown (2 of 2)]
[im 1/44]
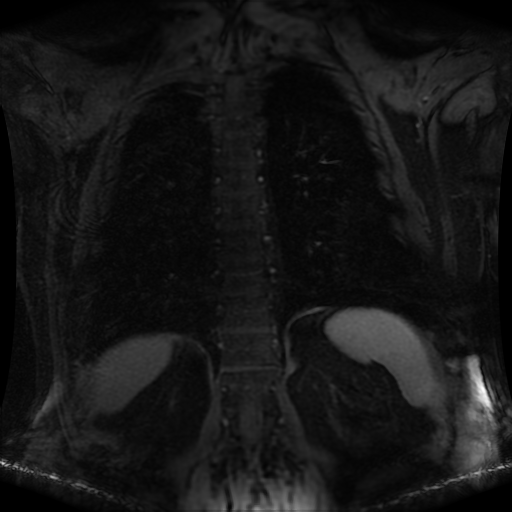

[((id)/(id)/1)-((id)/(id)/1) · sagittal · 3.0mm · 0.78mm/px · 2 of 60 slices shown]
[im 1/60]
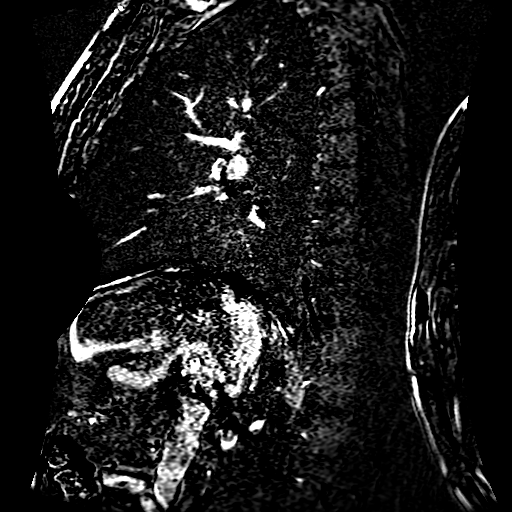
[im 60/60]
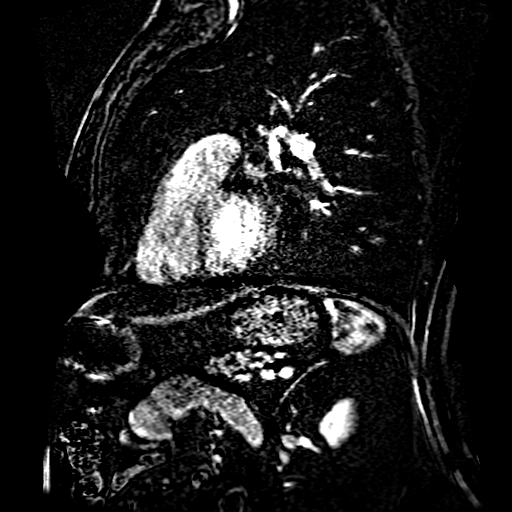

[processed images · sagittal · 3.0mm · 0.88mm/px · 1 of 5 slices shown]
[im 1/5]
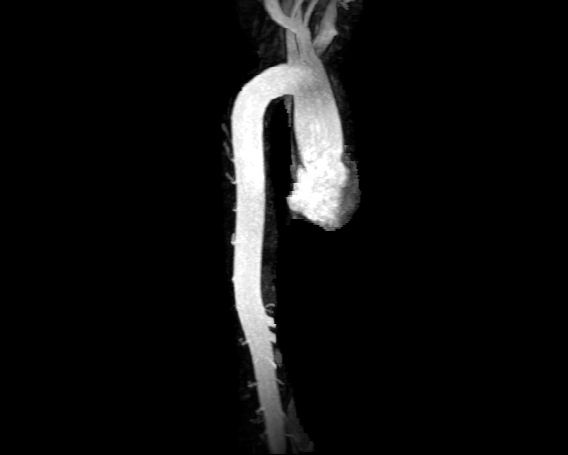

[16 of 16 positions shown; findings below may reference images not displayed]

FINDINGS: Maximal diameter of the ascending aorta at the sinus of all saw
above, Neko junction, and ascending aorta are 4.0 cm,
cm, and 4.1 cm. Previously, maximal diameter of the ascending aorta
was 4.0 cm. There is no evidence of dissection or intramural
hematoma. There is no obvious mass effect within the mediastinum.

The great vessels are patent within the confines of the examination.

Small ovoid density in the right middle lobe on image 30 of series
15 is stable. No new pulmonary mass.

Cystic lesion in the right kidney is grossly stable.
IMPRESSION: Aneurysmal dilatation of the ascending aorta has increased from
cm to 4.1 cm.Recommend annual imaging followup by CTA or MRA. This
recommendation follows 8727
ACCF/AHA/AATS/ACR/ASA/SCA/MATSUMOTO/DECOSTA/BICH THUY/EINO-PEKKA Guidelines for the
Diagnosis and Management of Patients with Thoracic Aortic Disease.
Circulation. 8727; 121: [id number]

## 2018-07-28 ENCOUNTER — Other Ambulatory Visit: Payer: Self-pay | Admitting: Nurse Practitioner

## 2018-08-22 ENCOUNTER — Other Ambulatory Visit: Payer: Self-pay | Admitting: Internal Medicine

## 2018-08-24 NOTE — Telephone Encounter (Signed)
Please review for refill.  

## 2018-09-22 ENCOUNTER — Telehealth: Payer: Self-pay | Admitting: Nurse Practitioner

## 2018-09-22 NOTE — Telephone Encounter (Signed)
New Message     unable to leave message Called to confirm appt and answer covid questions

## 2018-09-22 NOTE — Progress Notes (Signed)
CARDIOLOGY OFFICE NOTE  Date:  09/23/2018    James Rodgers Date of Birth: 12/01/1950 Medical Record #[id number]  PCP:  Leanna Battles, MD  Cardiologist:  [facility]    Chief Complaint  Patient presents with  . Follow-up    6 month check.     History of Present Illness: James Rodgers is a 68 y.o. male who presents today for a follow up visit. Seen for Dr. Saunders Revel. He is a former patient of Dr. Susa Simmonds as well as Dr. Claris Gladden. He would like to follow with me going forward.  He has a history ofmoderate nonobstructive coronary artery disease by catheterization in 2003, thoracic aortic aneurysm,hypertension,hyperlipidemia,and diabetes mellitus. He has been on chronic DAPT as part of his medical management of his CAD.   Last seen by Dr. Saunders Revel in [facility] 2019- felt to be doing ok. Some vague chest pain - but this has been chronic since his remote cath in 2003. He had noted some orthostasis while outside in the heat. BP was marginal at his visit but no medicine changes due to the occasional orthostasis.  Isaw him back in July of 2019 several times - he was very orthostatic followinghisknee surgery andin the setting of significantweight loss. Lots of medicines were stopped. I was able to wean off Midodrine. He ended up doing well - BP started tracking back up - hwe have had to restart medicines. He has been followed in the HTN clinic as well.   Previously tried:irbesartan 150 mg daily, minoxidil 5 mg qhs, hydrochlorothiazide (pt thought It was short term), amlodipine (unknown why it was stopped), valsartan   I last saw him in January - he was doing well - had gained some weight back and was going to work on that. BP cuff did NOT correlate.   The patient does not have symptoms concerning for COVID-19 infection (fever, chills, cough, or new shortness of breath).   Comes in today. Here alone. He feels like he is doing well. He did lose his mother last week -  she was 29 - it was pretty peaceful. Other than that, he is doing ok. No chest pain. Breathing is fine. Has gained some "COVID" weight due to staying in - gym is closed, hot weather, etc. Feels good on his medicines. Very little lightheadedness. He does take ARB in the Am and beta blocker in the PM - thinking about changing that. Labs from May noted from his physical.   Past Medical History:  Diagnosis Date  . Anal fissure   . Arthritis   . Basal cell carcinoma    neck  . CAD (coronary artery disease)    a. 2003 Cath: LAD 50, D1 50-60, RCA 40;  b. 12/2008 MV: no ischemia/infarct, EF 63%. // c. MV 8/17: mild inf-sept and apical ischemia, EF 53%, Low Risk  . DDD (degenerative disc disease), lumbar   . Diabetes type 2, controlled ([place])   . GERD (gastroesophageal reflux disease)   . Hemorrhoids   . History of dizziness   . History of nuclear stress test    a. Myoview 8/17: EF 53%, mild inferoseptal and mild apical ischemia, low risk  . HLD (hyperlipidemia)   . HTN (hypertension)   . LBP (low back pain)    a. h/o surgery in 1991.  . Obesity   . Orthostatic lightheadedness    with heat exposure  . Osteoarthritis    knees  . Perineal abscess 09/2012  . Pneumonia 09/2005  .  PONV (postoperative nausea and vomiting)   . Renal cyst, right   . Retinopathy    Mild, left eye  . Right ureteral stone   . Seasonal allergic rhinitis   . Sleep apnea    a. on cpap.  [name] Spondylosis    lumbar  . Thoracic aortic aneurysm ([place])    a. MRA 5/17 ascending thoracic aorta measuring 40 mm - recommend annual follow-up // b.  Echo 5/17: Moderate concentric LVH, EF 55-60%, normal wall motion, grade 1 diastolic dysfunction, ascending aortic diameter 45 mm, mild MR, mild LAE // c. Chest MRA 6/18: stable ascending aortic aneurysm measuring 4.1 cm (4.0 cm in 5/17). FU 1 year.    Past Surgical History:  Procedure Laterality Date  . anal fissure surgery  1981  . CARDIAC CATHETERIZATION  2004   1 vessel CAD  .  COLONOSCOPY  [id number]  . KNEE ARTHROSCOPY Left   . LASIK    . [place]   x2  . NASAL SINUS SURGERY  2001  . NASAL SINUS SURGERY  2002  . TOTAL KNEE ARTHROPLASTY Left 09/12/2017   Procedure: LEFT TOTAL KNEE ARTHROPLASTY;  Surgeon: Mcarthur Rossetti, MD;  Location: [facility];  Service: Orthopedics;  Laterality: Left;     Medications: Current Meds  Medication Sig  . aspirin EC 81 MG tablet Take 81 mg by mouth at bedtime.  [name] atorvastatin (LIPITOR) 80 MG tablet TAKE ONE TABLET BY MOUTH DAILY  . clopidogrel (PLAVIX) 75 MG tablet Take 75 mg by mouth daily.  [name] docusate sodium (COLACE) 100 MG capsule Take 100 mg by mouth at bedtime.  [name] esomeprazole (NEXIUM) 40 MG capsule Take 40 mg by mouth daily.    [name] ezetimibe (ZETIA) 10 MG tablet Take 10 mg by mouth daily.  [name] FARXIGA 10 MG TABS tablet Take 10 mg by mouth daily.   . fexofenadine (ALLEGRA) 180 MG tablet Take 180 mg by mouth daily as needed (for allergies).   . FIBER PO Take 1 capsule by mouth 2 (two) times daily.  . fluticasone (FLONASE) 50 MCG/ACT nasal spray Place 2 sprays into both nostrils daily as needed for allergies or rhinitis.  . metFORMIN (GLUCOPHAGE-XR) 500 MG 24 hr tablet Take 500 mg by mouth 2 (two) times daily.   . metoprolol succinate (TOPROL-XL) 25 MG 24 hr tablet Take 1 tablet (25 mg total) by mouth daily. Take with or immediately following a meal.  . olmesartan (BENICAR) 40 MG tablet TAKE ONE TABLET BY MOUTH DAILY  . tamsulosin (FLOMAX) 0.4 MG CAPS capsule Take 0.4 mg by mouth at bedtime.      Allergies: Allergies  Allergen Reactions  . Codeine Other (See Comments)    dizzy    Social History: The patient  reports that he has never smoked. He has never used smokeless tobacco. He reports that he does not drink alcohol or use drugs.   Family History: The patient's family history includes Diabetes in an other family member; Heart disease in his father.   Review of Systems: Please see the history  of present illness.   All other systems are reviewed and negative.   Physical Exam: VS:  BP 130/80 (BP Location: Left Arm, Patient Position: Sitting, Cuff Size: Large)   Pulse 75   Ht 6\' 5"  (1.956 m)   Wt 294 lb (133.4 kg)   SpO2 98% Comment: at rest  BMI 34.86 kg/m  .  BMI Body mass index is 34.86 kg/m.  Wt  Readings from Last 3 Encounters:  09/23/18 294 lb (133.4 kg)  03/25/18 289 lb 12.8 oz (131.5 kg)  11/26/17 280 lb (127 kg)    General: Pleasant. Well developed, well nourished and in no acute distress.  He has gained weight.  HEENT: Normal.  Neck: Supple, no JVD, carotid bruits, or masses noted.  Cardiac: Regular rate and rhythm. No murmurs, rubs, or gallops. No edema.  Respiratory:  Lungs are clear to auscultation bilaterally with normal work of breathing.  GI: Soft and nontender.  MS: No deformity or atrophy. Gait and ROM intact.  Skin: Warm and dry. Color is normal.  Neuro:  Strength and sensation are intact and no gross focal deficits noted.  Psych: Alert, appropriate and with normal affect.   LABORATORY DATA:  EKG:  EKG is ordered today. This demonstrates NSR with non specific T wave changes.  Lab Results  Component Value Date   WBC 8.6 10/01/2017   HGB 14.0 10/01/2017   HCT 43.4 10/01/2017   PLT 480 (H) 10/01/2017   GLUCOSE 179 (H) 02/17/2018   CHOL 129 11/26/2017   TRIG 104 11/26/2017   HDL 36 (L) 11/26/2017   LDLCALC 72 11/26/2017   ALT 26 11/26/2017   AST 14 11/26/2017   NA 141 02/17/2018   K 4.5 02/17/2018   CL 102 02/17/2018   CREATININE 1.22 02/17/2018   BUN 27 02/17/2018   CO2 21 02/17/2018   TSH 4.170 10/01/2017       BNP (last 3 results) No results for input(s): BNP in the last 8760 hours.  ProBNP (last 3 results) No results for input(s): PROBNP in the last 8760 hours.   Other Studies Reviewed Today:  MRA CHESTFINDINGS6/2019: Maximal diameters of the ascending aorta at the sinus of Valsalva, sino-tubular junction, and  ascending aorta are 4.1 cm, 3.5 cm, and 4.1 cm respectively. This compares with a maximal diameter of 4.1 cm in the ascending aorta on the prior study. There is no evidence of dissection. There is no evidence of acute intramural hematoma. The great vessels are patent including the vertebral arteries, within the confines of the examination.  No obvious mediastinal mass. Small nodule in the right middle lobe on image 39 of series 11 is stable. No acute findings in the abdomen.  IMPRESSION: Maximal diameter of the ascending aorta is stable at 4.1 cm. Recommend annual imaging followup by CTA or MRA. This recommendation follows 2010 ACCF/AHA/AATS/ACR/ASA/SCA/SCAI/SIR/STS/SVM Guidelines for the Diagnosis and Management of Patients with Thoracic Aortic Disease. Circulation. 2010; 121: C588-F027   Electronically Signed By: Marybelle Killings M.D. On: 08/12/2017 08:45    MyoviewStudy Highlights8/2017    The left ventricular ejection fraction is mildly decreased (45-54%).  Nuclear stress EF: 53%.  There was no ST segment deviation noted during stress.  Defect 1: There is a small defect of moderate severity present in the basal inferoseptal and mid inferoseptal location.  Defect 2: There is a small defect of mild severity present in the apex location.  This is a low risk study.  Low risk stress nuclear study with mild ischemia in the basal inferoseptal wall and mild ischemia in the apical wall; EF 53 with mild global hypokinesis and mild LVE.   EchoStudy Conclusions5/2017  - Left ventricle: The cavity size was normal. There was moderate concentric hypertrophy. Systolic function was normal. The estimated ejection fraction was in the range of 55% to 60%. Wall motion was normal; there were no regional wall motion abnormalities. Doppler parameters are consistent with abnormal  left ventricular relaxation (grade 1 diastolic dysfunction). - Aortic valve:  Trileaflet; mildly thickened, mildly calcified leaflets. - Aorta: Ascending aortic diameter: 45 mm (S). - Mitral valve: There was mild regurgitation. - Left atrium: The atrium was mildly dilated. - Right ventricle: The cavity size was mildly dilated. Wall thickness was normal  Assessment/Plan:  1.Prior orthostasis -this is resolved. Endorses very little lightheadedness - he is going to try and change his timing and switch the ARB and beta blocker. He will continue to monitor.   2. CAD - nonobstructive - heremains onchronic DAPT therapy -he has done this for many years and wishes to continue - he hadlow risk Myoview from 2017. He has no active chest pain. He has gotten lax with exercise due to COVID - encouraged activity as best he can.  3. Thoracic aneurysm - BP is ok. Will arrange for repeat MRA and BMET today. He has been given precautions.   4. HTN -BP looks good today. See #1.   Noted previously by pharmacy that since his is now on Farxiga, we should avoid the addition of another diuretic as it is felt this will drop his pressure significantly again.  5. HLD - on statin and Zetia. Most recent labs noted.   6. COVID-19 Education: The signs and symptoms of COVID-19 were discussed with the patient and how to seek care for testing (follow up with PCP or arrange E-visit).  The importance of social distancing, staying at home, hand hygiene and wearing a mask when out in public were discussed today.  Current medicines are reviewed with the patient today.  The patient does not have concerns regarding medicines other than what has been noted above.  The following changes have been made:  See above.  Labs/ tests ordered today include:    Orders Placed This Encounter  Procedures  . MR Angiogram Chest W Wo Contrast  . Basic metabolic panel  . EKG 12-Lead     Disposition:   FU with me in about 6 months.   Patient is agreeable to this plan and will call if any problems  develop in the interim.   SignedTruitt Merle, NP  09/23/2018 10:40 AM  [facility] [address] [place], [place]  [address] Phone: [phone] Fax: [phone]

## 2018-09-23 ENCOUNTER — Ambulatory Visit (INDEPENDENT_AMBULATORY_CARE_PROVIDER_SITE_OTHER): Payer: Medicare Other | Admitting: Nurse Practitioner

## 2018-09-23 ENCOUNTER — Encounter: Payer: Self-pay | Admitting: Nurse Practitioner

## 2018-09-23 ENCOUNTER — Other Ambulatory Visit: Payer: Self-pay

## 2018-09-23 VITALS — BP 130/80 | HR 75 | Ht 77.0 in | Wt 294.0 lb

## 2018-09-23 DIAGNOSIS — I712 Thoracic aortic aneurysm, without rupture, unspecified: Secondary | ICD-10-CM

## 2018-09-23 DIAGNOSIS — I10 Essential (primary) hypertension: Secondary | ICD-10-CM

## 2018-09-23 DIAGNOSIS — E785 Hyperlipidemia, unspecified: Secondary | ICD-10-CM | POA: Diagnosis not present

## 2018-09-23 DIAGNOSIS — I2510 Atherosclerotic heart disease of native coronary artery without angina pectoris: Secondary | ICD-10-CM | POA: Diagnosis not present

## 2018-09-23 LAB — BASIC METABOLIC PANEL
BUN/Creatinine Ratio: 25 — ABNORMAL HIGH (ref 10–24)
BUN: 22 mg/dL (ref 8–27)
CO2: 21 mmol/L (ref 20–29)
Calcium: 10.1 mg/dL (ref 8.6–10.2)
Chloride: 107 mmol/L — ABNORMAL HIGH (ref 96–106)
Creatinine, Ser: 0.88 mg/dL (ref 0.76–1.27)
GFR calc Af Amer: 101 mL/min/{1.73_m2} (ref >59)
GFR calc non Af Amer: 88 mL/min/{1.73_m2} (ref >59)
Glucose: 129 mg/dL — ABNORMAL HIGH (ref 65–99)
Potassium: 4.5 mmol/L (ref 3.5–5.2)
Sodium: 145 mmol/L — ABNORMAL HIGH (ref 134–144)

## 2018-09-23 NOTE — Patient Instructions (Addendum)
After Visit Summary:  We will be checking the following labs today - BMET  Medication Instructions:    Continue with your current medicines.    If you need a refill on your cardiac medications before your next appointment, please call your pharmacy.     Testing/Procedures To Be Arranged:  MRA of the chest with/without contrast  Follow-Up:   See me in about 6 months    At [facility], you and your health needs are our priority.  As part of our continuing mission to provide you with exceptional heart care, we have created designated Provider Care Teams.  These Care Teams include your primary Cardiologist (physician) and Advanced Practice Providers (APPs -  Physician Assistants and Nurse Practitioners) who all work together to provide you with the care you need, when you need it.  Special Instructions:  . Stay safe, stay home, wash your hands for at least 20 seconds and wear a mask when out in public.  . It was good to talk with you today.    Call the [place] office at [phone] if you have any questions, problems or concerns.

## 2018-10-28 ENCOUNTER — Ambulatory Visit (HOSPITAL_COMMUNITY): Payer: Medicare Other

## 2018-11-02 ENCOUNTER — Ambulatory Visit (HOSPITAL_COMMUNITY)
Admission: RE | Admit: 2018-11-02 | Discharge: 2018-11-02 | Disposition: A | Discharge status: Home / Self Care | Payer: Medicare Other | Source: Ambulatory Visit | Attending: Nurse Practitioner | Admitting: Nurse Practitioner

## 2018-11-02 ENCOUNTER — Other Ambulatory Visit: Payer: Self-pay

## 2018-11-02 DIAGNOSIS — I712 Thoracic aortic aneurysm, without rupture, unspecified: Secondary | ICD-10-CM

## 2018-11-02 MED ORDER — GADOBUTROL 1 MMOL/ML IV SOLN
10.0000 mL | Freq: Once | INTRAVENOUS | Status: AC | PRN
  Administered 2018-11-02: 10 mL via INTRAVENOUS

## 2018-11-23 ENCOUNTER — Ambulatory Visit (INDEPENDENT_AMBULATORY_CARE_PROVIDER_SITE_OTHER): Payer: Medicare Other | Admitting: Orthopaedic Surgery

## 2018-11-23 ENCOUNTER — Ambulatory Visit (INDEPENDENT_AMBULATORY_CARE_PROVIDER_SITE_OTHER): Payer: Medicare Other

## 2018-11-23 ENCOUNTER — Encounter: Payer: Self-pay | Admitting: Orthopaedic Surgery

## 2018-11-23 VITALS — Ht 77.0 in | Wt 294.0 lb

## 2018-11-23 DIAGNOSIS — M545 Low back pain, unspecified: Secondary | ICD-10-CM

## 2018-11-23 DIAGNOSIS — Z96652 Presence of left artificial knee joint: Secondary | ICD-10-CM

## 2018-11-23 DIAGNOSIS — M25562 Pain in left knee: Secondary | ICD-10-CM

## 2018-11-23 MED ORDER — LIDOCAINE HCL 1 % IJ SOLN
1.0000 mL | INTRAMUSCULAR | Status: AC | PRN
  Administered 2018-11-23: 1 mL

## 2018-11-23 MED ORDER — METHYLPREDNISOLONE ACETATE 40 MG/ML IJ SUSP
40.0000 mg | INTRAMUSCULAR | Status: AC | PRN
  Administered 2018-11-23: 40 mg via INTRAMUSCULAR

## 2018-11-23 NOTE — Progress Notes (Signed)
Office Visit Note   Patient: James Rodgers           Date of Birth: 1950-03-14           MRN: [id number] Visit Date: 11/23/2018              Requested by: Leanna Battles, MD [address] [place],  [place] [address] PCP: Leanna Battles, MD   Assessment & Plan: Visit Diagnoses:  1. History of left knee replacement   2. Acute right-sided low back pain without sciatica     Plan: I was comfortable with providing a trigger point injection with a 1 cc of steroid and 1 cc of Depo-Medrol around the area of maximum tenderness in his lower back.  He can try Voltaren gel in this area as well as intermittent ice and heat.  All question concerns were answered addressed.  He will follow-up for this as needed as well as his left total knee arthroplasty.  We can always set him up for physical therapy for dry needling and other modalities for his back if he needs it.  Follow-Up Instructions: Return if symptoms worsen or fail to improve.   Orders:  Orders Placed This Encounter  Procedures  . Trigger Point Inj  . XR Knee 1-2 Views Left   No orders of the defined types were placed in this encounter.     Procedures: Trigger Point Inj  Date/Time: 11/23/2018 11:38 AM Performed by: Mcarthur Rossetti, MD Authorized by: Mcarthur Rossetti, MD   Indications:  Muscle spasm and pain Total # of Trigger Points:  1 Location: back   Medications #1:  1 mL lidocaine 1 %; 40 mg methylPREDNISolone acetate 40 MG/ML     Clinical Data: No additional findings.   Subjective: Chief Complaint  Patient presents with  . Left Knee - Follow-up  The patient is well-known to me.  He is over a year out from a left total knee arthroplasty.  This was done with a press-fit implant.  He has no problems with his knee at all or complaints with his left knee.  He is back to golf as well.  He does report some occasional numbness in the left knee that has been improving and resolving.  He does wish  for me to take a look at his low back today in terms of the muscles to just the right side.  He has a trigger point is developed in the mid lumbar spine area to the right side that is been aggravating him for just a few weeks.  He denies any radicular symptoms down his legs.  He denies any weakness in his legs.  Denies any change in bowel bladder function.  Again the left knee does not swell on him at all.  HPI  Review of Systems He currently denies any headache, chest pain, shortness of breath, fever, chills, nausea,  Objective: Vital Signs: Ht 6\' 5"  (1.956 m)   Wt 294 lb (133.4 kg)   BMI 34.86 kg/m   Physical Exam He is alert and orient x3 and in no acute distress Ortho Exam Examination of his left operative knee shows a well-healed surgical incision.  There is no effusion.  His knee has excellent range of motion on the left side.  It is ligamentously stable as well.  Examination of his lumbar spine shows a small area of trigger point pain in the paraspinal muscles more lateral at the right side of the mid lumbar spine.  I agree that this seems to be more of a spasm and trigger point area. Specialty Comments:  No specialty comments available.  Imaging: No results found.   PMFS History: Patient Active Problem List   Diagnosis Date Noted  . Status post total left knee replacement 09/12/2017  . Unilateral primary osteoarthritis, left knee 08/13/2017  . Chronic pain of left knee 08/13/2017  . Obstructive sleep apnea treated with continuous positive airway pressure (CPAP) 05/26/2017  . Thoracic aortic aneurysm ([place])   . Dizziness 07/25/2015  . Syncope 11/03/2012  . Hemorrhoids, complicated [id number]  . Perirectal abscess 01/07/2012  . Essential hypertension 10/02/2010  . Hyperlipidemia LDL goal <70 10/02/2010  . DM 01/31/2010  . Coronary artery disease involving native heart without angina pectoris 01/31/2010   Past Medical History:  Diagnosis Date  . Anal fissure   .  Arthritis   . Basal cell carcinoma    neck  . CAD (coronary artery disease)    a. 2003 Cath: LAD 50, D1 50-60, RCA 40;  b. 12/2008 MV: no ischemia/infarct, EF 63%. // c. MV 8/17: mild inf-sept and apical ischemia, EF 53%, Low Risk  . DDD (degenerative disc disease), lumbar   . Diabetes type 2, controlled ([place])   . GERD (gastroesophageal reflux disease)   . Hemorrhoids   . History of dizziness   . History of nuclear stress test    a. Myoview 8/17: EF 53%, mild inferoseptal and mild apical ischemia, low risk  . HLD (hyperlipidemia)   . HTN (hypertension)   . LBP (low back pain)    a. h/o surgery in 1991.  . Obesity   . Orthostatic lightheadedness    with heat exposure  . Osteoarthritis    knees  . Perineal abscess 09/2012  . Pneumonia 09/2005  . PONV (postoperative nausea and vomiting)   . Renal cyst, right   . Retinopathy    Mild, left eye  . Right ureteral stone   . Seasonal allergic rhinitis   . Sleep apnea    a. on cpap.  [name] Spondylosis    lumbar  . Thoracic aortic aneurysm ([place])    a. MRA 5/17 ascending thoracic aorta measuring 40 mm - recommend annual follow-up // b.  Echo 5/17: Moderate concentric LVH, EF 55-60%, normal wall motion, grade 1 diastolic dysfunction, ascending aortic diameter 45 mm, mild MR, mild LAE // c. Chest MRA 6/18: stable ascending aortic aneurysm measuring 4.1 cm (4.0 cm in 5/17). FU 1 year.    Family History  Problem Relation Age of Onset  . Heart disease Father   . Diabetes Other        GM    Past Surgical History:  Procedure Laterality Date  . anal fissure surgery  1981  . CARDIAC CATHETERIZATION  2004   1 vessel CAD  . COLONOSCOPY  [id number]  . KNEE ARTHROSCOPY Left   . LASIK    . [place]   x2  . NASAL SINUS SURGERY  2001  . NASAL SINUS SURGERY  2002  . TOTAL KNEE ARTHROPLASTY Left 09/12/2017   Procedure: LEFT TOTAL KNEE ARTHROPLASTY;  Surgeon: Mcarthur Rossetti, MD;  Location: [facility];  Service: Orthopedics;   Laterality: Left;   Social History   Occupational History  . Not on file  Tobacco Use  . Smoking status: Never Smoker  . Smokeless tobacco: Never Used  Substance and Sexual Activity  . Alcohol use: No  . Drug use: No  . Sexual  activity: Not on file

## 2018-11-25 ENCOUNTER — Encounter: Payer: Self-pay | Admitting: Neurology

## 2018-12-01 ENCOUNTER — Encounter: Payer: Self-pay | Admitting: Neurology

## 2018-12-01 ENCOUNTER — Other Ambulatory Visit: Payer: Self-pay

## 2018-12-01 ENCOUNTER — Ambulatory Visit (INDEPENDENT_AMBULATORY_CARE_PROVIDER_SITE_OTHER): Payer: Medicare Other | Admitting: Neurology

## 2018-12-01 DIAGNOSIS — G4733 Obstructive sleep apnea (adult) (pediatric): Secondary | ICD-10-CM | POA: Diagnosis not present

## 2018-12-01 DIAGNOSIS — E66812 Obesity, class 2: Secondary | ICD-10-CM

## 2018-12-01 DIAGNOSIS — Z9989 Dependence on other enabling machines and devices: Secondary | ICD-10-CM | POA: Diagnosis not present

## 2018-12-01 NOTE — Progress Notes (Signed)
[facility]  PATIENT: James Rodgers DOB: Jan 11, 1951   REASON FOR VISIT: Follow-up for obstructive sleep apnea with  CPAP compliance  HISTORY FROM: Patient alone     HISTORY OF PRESENT ILLNESS:  Update 12-01-2018, James Rodgers is a meanwhile 68 year old caucasian right handed male patient , following up for CPAP compliance.  James Rodgers still reports 1 or 2 nocturnal use by using CPAP, but he has been extremely compliant patient.  He is kind 30-day download encompassing the time between 18 August on 25 November 2018 shows 100% compliance by days and hours.  Average user time 7 hours 10 minutes.  The air sense 10 AutoSet gives a pressure window between 8 and 15 cmH2O with 3 cm EPR, residual AHI is 0.7/h which speaks for an excellent resolution.  The 95th percentile pressure is 9.3 so well within the current settings.  He endorsed the Epworth sleepiness score at 5 out of 25 points on the severe fatigue severity at 20 out of 63 points.  Both would be in normal limits his blood pressure today is 132/84 mmHg with a regular pulse of 62.  His current BMI is 35.4    UPDATE 11/26/2017 CM James Rodgers 68 year old male returns for follow-up with history of obstructive sleep apnea here for CPAP compliance.  He is doing well with his CPAP has no new issues.  Much less fatigue, no daytime drowsiness.  CPAP data dated 10/26/2017-11/24/2017 shows compliance greater than 4 hours at 93%.  Average usage 7 hours 30 minutes.  Pressure 8 to 15 cm.  EPR level 3 AHI 0.5.  ESS 3.  He had left knee replacement in July.  He returns for reevaluation  UPDATE 3/18/2019CM James Rodgers, 68 year old male returns for follow-up with history of obstructive sleep apnea here with new CPAP machine with initial CPAP compliance.  Data dated 04/25/2017 -05/24/2017 shows compliance greater than 4 hours 100%.  Average usage 7 hours 25 minutes pressure 5-15 cm.  EPR level 3 AHI 0.1 leaks 95th percentile 7.1.   Patient's only comment is that he had nasal pillows previously and he felt he got more air with those.  With his current mask he does not feel like he is getting enough air when he first goes to sleep but he sleeps well.  He was advised to go to [facility] and ask about nasal pillows.  He returns for reevaluation. 11/6/18CDMr. maxum Rodgers today on 14 January 2017, a patient I last encountered in February 2010, at the time referred by Dr. Dagmar Hait. The patient was 68 years old at the time, with a diagnosis of hypertension, hyperlipidemia, diabetes type 2, allergic rhinitis and existing diagnosis of obstructive sleep apnea on CPAP.  Altogether he used a setting between 12 and 14 cm water but had started to snore loudly again in spite of using CPAP. He had endorsed the Epworth sleepiness scale at the time at 16 points indicating a high degree of daytime sleepiness.  A polysomnogram documented severe apnea to be still present with an AHI of 70.7/h only mildly accentuated by REM sleep and not depending on sleep position.  He did not have protracted oxygen desaturation but very loud snoring.  He stopped CPAP the same night reducing his AHI to 2.5 at a low pressure setting.  He was also fitted at the time with a Respironics comfort gel nasal mask.  He continues to use a nasal mask and continues to use the machine I prescribed almost 9 years  ago.  He would be definitely due for a new machine now.  He continued to use 10 cm water pressure until now, highly compliant until his machine broke.   Chief complaint according to patient :" I need a new machine and my wife is bothered if I don't use CPAP "  Sleep habits are as follows: James Rodgers usually reads in bed for about a half hour, his bedtime is between 1030 and 11 PM, he has no trouble initiating sleep and usually can sleep through the night except for 1 or 2 nocturia breaks.  Since his machine broke he has to unplug and re-plotted in multiple times.  He  does not use a humidifier feature after he developed condensation water in tube and in the mouth.  He sleeps on a flat mattress, with one pillow only and usually rests on his side- the bedroom is cool, quiet and dark.  He shares a bedroom with his spouse. The patient usually arises at 7 AM.   REVIEW OF SYSTEMS: Full 14 system review of systems performed and notable only for those listed, all others are neg:  Allergy/Immunology: Environmental allergies Obesity.  Chronic back pain- " pulled muscle".  Sleep : Obstructive sleep apnea with CPAP. Status post knee replacement on the left side, orthostatic hypotension- followed surgery- now resolved.  Nocturia.  How likely are you to doze in the following situations: 0 = not likely, 1 = slight chance, 2 = moderate chance, 3 = high chance  Sitting and Reading? Watching Television? Sitting inactive in a public place (theater or meeting)? Lying down in the afternoon when circumstances permit? Sitting and talking to someone? Sitting quietly after lunch without alcohol? In a car, while stopped for a few minutes in traffic? As a passenger in a car for an hour without a break?  Total = 5, no sleep attacks. Lunchtime nap 15-20 minute power nap, feels refreshing.      ALLERGIES: Allergies  Allergen Reactions  . Codeine Other (See Comments)    dizzy    HOME MEDICATIONS: Outpatient Medications Prior to Visit  Medication Sig Dispense Refill  . aspirin EC 81 MG tablet Take 81 mg by mouth at bedtime.    [name] atorvastatin (LIPITOR) 80 MG tablet TAKE ONE TABLET BY MOUTH DAILY 90 tablet 2  . clopidogrel (PLAVIX) 75 MG tablet Take 75 mg by mouth daily.    [name] docusate sodium (COLACE) 100 MG capsule Take 100 mg by mouth at bedtime.    [name] esomeprazole (NEXIUM) 40 MG capsule Take 40 mg by mouth daily.      [name] ezetimibe (ZETIA) 10 MG tablet Take 10 mg by mouth daily.    [name] FARXIGA 10 MG TABS tablet Take 10 mg by mouth daily.     . fexofenadine (ALLEGRA) 180 MG  tablet Take 180 mg by mouth daily as needed (for allergies).     . FIBER PO Take 1 capsule by mouth 2 (two) times daily.    . fluticasone (FLONASE) 50 MCG/ACT nasal spray Place 2 sprays into both nostrils daily as needed for allergies or rhinitis.    . metFORMIN (GLUCOPHAGE-XR) 500 MG 24 hr tablet Take 500 mg by mouth 2 (two) times daily.     [name] olmesartan (BENICAR) 40 MG tablet TAKE ONE TABLET BY MOUTH DAILY 90 tablet 0  . tamsulosin (FLOMAX) 0.4 MG CAPS capsule Take 0.4 mg by mouth at bedtime.     . metoprolol succinate (TOPROL-XL) 25 MG 24 hr tablet Take  1 tablet (25 mg total) by mouth daily. Take with or immediately following a meal. 90 tablet 3   No facility-administered medications prior to visit.     PAST MEDICAL HISTORY: Past Medical History:  Diagnosis Date  . Anal fissure   . Arthritis   . Basal cell carcinoma    neck  . CAD (coronary artery disease)    a. 2003 Cath: LAD 50, D1 50-60, RCA 40;  b. 12/2008 MV: no ischemia/infarct, EF 63%. // c. MV 8/17: mild inf-sept and apical ischemia, EF 53%, Low Risk  . DDD (degenerative disc disease), lumbar   . Diabetes type 2, controlled ([place])   . GERD (gastroesophageal reflux disease)   . Hemorrhoids   . History of dizziness   . History of nuclear stress test    a. Myoview 8/17: EF 53%, mild inferoseptal and mild apical ischemia, low risk  . HLD (hyperlipidemia)   . HTN (hypertension)   . LBP (low back pain)    a. h/o surgery in 1991.  . Obesity   . Orthostatic lightheadedness    with heat exposure  . Osteoarthritis    knees  . Perineal abscess 09/2012  . Pneumonia 09/2005  . PONV (postoperative nausea and vomiting)   . Renal cyst, right   . Retinopathy    Mild, left eye  . Right ureteral stone   . Seasonal allergic rhinitis   . Sleep apnea    a. on cpap.  [name] Spondylosis    lumbar  . Thoracic aortic aneurysm ([place])    a. MRA 5/17 ascending thoracic aorta measuring 40 mm - recommend annual follow-up // b.  Echo 5/17:  Moderate concentric LVH, EF 55-60%, normal wall motion, grade 1 diastolic dysfunction, ascending aortic diameter 45 mm, mild James, mild LAE // c. Chest MRA 6/18: stable ascending aortic aneurysm measuring 4.1 cm (4.0 cm in 5/17). FU 1 year.  2019 abdominal aneurysm scan showed 4.2 mm     PAST SURGICAL HISTORY: Past Surgical History:  Procedure Laterality Date  . anal fissure surgery  1981  . CARDIAC CATHETERIZATION  2004   1 vessel CAD  . COLONOSCOPY  [id number]  . KNEE ARTHROSCOPY Left   . LASIK    . [place]   x2  . NASAL SINUS SURGERY  2001  . NASAL SINUS SURGERY  2002  . TOTAL KNEE ARTHROPLASTY Left 09/12/2017   Procedure: LEFT TOTAL KNEE ARTHROPLASTY;  Surgeon: Mcarthur Rossetti, MD;  Location: [facility];  Service: Orthopedics;  Laterality: Left;    FAMILY HISTORY: Family History  Problem Relation Age of Onset  . Heart disease Father Died age 3  . Diabetes Grandmother            PGM Alcoholism and emphysema in father -  One sister, healthy but has DM.     SOCIAL HISTORY: Social History   Socioeconomic History  . Marital status: Married    Spouse name: Romie Minus   . Number of children: 2 adult   . Years of education: Not on file  . Highest education level: Not on file  Occupational History  . Not on file  Social Needs  . Financial resource strain:   . Food insecurity    Worry:     Inability:   . Transportation needs    Medical:     Non-medical:   Tobacco Use  . Smoking status: Never Smoker  . Smokeless tobacco: Never Used  Substance and Sexual Activity  .  Alcohol use: No  . Drug use:  No  . Caffeine  Coffee in AM and caffeinated ice tea and soda in after lunch .   Lifestyle  . Physical activity    Days per week: Not on file    Minutes per session: Not on file  . Stress: Not on file  Relationships  . Social Herbalist on phone: Not on file    Gets together: Not on file    Attends religious service: Not on file    Active  member of club or organization: Not on file    Attends meetings of clubs or organizations: Not on file    Relationship status: Not on file  . Intimate partner violence    Fear of current or ex partner: Not on file    Emotionally abused: Not on file    Physically abused: Not on file    Forced sexual activity: Not on file  Other Topics Concern  . Not on file  Social History Narrative   Married; retired Agricultural consultant; daily caffeine use.  Lives with wife in [place]. Goes to the gym about 2 times a week.     PHYSICAL EXAM  Vitals:   12/01/18 1014  BP: 132/84  Pulse: 62  Temp: (!) 96.8 F (36 C)  Weight: 299 lb (135.6 kg)  Height: 6\' 5"  (1.956 m)   Body mass index is 35.46 kg/m.  Generalized: Well developed, obese male in no acute distress - reports orthostatic hypotension, dizziness.  Head: normocephalic and atraumatic,. Oropharynx benign [place] 3 Neck: Supple, circumference is now 19 " down from 20 . Lungs clear to auscultation Musculoskeletal: No deformity  Skin no peripheral edema. Neurological examination   Mentation: Alert oriented to time, place, history taking. Attention span and concentration appropriate. Recent and remote memory intact.  Follows all commands speech and language fluent.   Cranial nerve : sense of smell impaired for years- sinus surgery. Taste is unaffected.  Pupils were equal round reactive to light extraocular movements were full,  visual field were full on confrontational test. Facial sensation and strength were normal. hearing was intact to finger rubbing bilaterally. Uvula tongue midline. head turning and shoulder shrug were symmetric.Tongue protrusion into cheek strength was normal. Motor: normal bulk and tone, full strength. Sensory: symmetric to light touch, pin prick  Coordination: finger-nose-finger, heel-to-shin bilaterally, no dysmetria Gait and Station: Rising up from seated position without assistance, normal stance,    DIAGNOSTIC DATA (LABS,  IMAGING, TESTING) - I reviewed patient records, labs, notes, testing and imaging myself where available.  02-18-2017 ; IMPRESSION:  1. Obstructive Sleep Apnea (OSA) is much milder than 9 years ago- AHI was 10.5 and in REM AHI was 23.4/hr.  2. Loud Primary Snoring. 3. Hypoxemia   RECOMMENDATIONS: Auto CPAP 5-15 cm water with nasal pillow or nasal mask of patient's choice.  We would not recommend dental device or ENT surgery due to REM accentuated and hypoxemia associated apnea form.     Lab Results  Component Value Date   WBC 8.6 10/01/2017   HGB 14.0 10/01/2017   HCT 43.4 10/01/2017   MCV 87 10/01/2017   PLT 480 (H) 10/01/2017      Component Value Date/Time   NA 145 (H) 09/23/2018 1051   K 4.5 09/23/2018 1051   CL 107 (H) 09/23/2018 1051   CO2 21 09/23/2018 1051   GLUCOSE 129 (H) 09/23/2018 1051   GLUCOSE 168 (H) 09/13/2017 0505   BUN 22 09/23/2018  1051   CREATININE 0.88 09/23/2018 1051   CALCIUM 10.1 09/23/2018 1051   PROT 6.8 11/26/2017 1553   ALBUMIN 4.5 11/26/2017 1553   AST 14 11/26/2017 1553   ALT 26 11/26/2017 1553   ALKPHOS 86 11/26/2017 1553   BILITOT 0.6 11/26/2017 1553   GFRNONAA 88 09/23/2018 1051   GFRAA 101 09/23/2018 1051   Lab Results  Component Value Date   CHOL 129 11/26/2017   HDL 36 (L) 11/26/2017   LDLCALC 72 11/26/2017   TRIG 104 11/26/2017   CHOLHDL 3.6 11/26/2017    ASSESSMENT AND PLAN  68 y.o. year old male  has a past medical history of obstructive sleep apnea with new CPAP machine here for initial CPAP compliance. Data dated 10/26/2017-11/24/2017 shows compliance greater than 4 hours at 93%.  Average usage 7 hours 30 minutes.  Pressure 8 to 15 cm.  EPR level 3 AHI 0.5.  ESS 3.    PLAN: CPAP compliance 100% reviewed data with patient, excellent resolution, AHI is below 1.  continue same settings, order supplies Follow-up yearly Larey Seat, MD  12-01-2018  [facility] [address], [place], [place]  [address] [phone]

## 2018-12-01 NOTE — Patient Instructions (Signed)

## 2018-12-16 ENCOUNTER — Other Ambulatory Visit: Payer: Self-pay

## 2018-12-16 DIAGNOSIS — Z20822 Contact with and (suspected) exposure to covid-19: Secondary | ICD-10-CM

## 2018-12-17 LAB — NOVEL CORONAVIRUS, NAA: SARS-CoV-2, NAA: NOT DETECTED

## 2018-12-24 ENCOUNTER — Other Ambulatory Visit: Payer: Self-pay | Admitting: Nurse Practitioner

## 2019-02-08 ENCOUNTER — Other Ambulatory Visit: Payer: Self-pay | Admitting: Neurology

## 2019-02-08 DIAGNOSIS — G4733 Obstructive sleep apnea (adult) (pediatric): Secondary | ICD-10-CM

## 2019-02-08 NOTE — Telephone Encounter (Signed)
Pt left voicemail on answering maching and stated that he ordered a CPAP machine offline and would like a script sent to the company he ordered from. Pt is requesting a call back to discuss further information

## 2019-02-08 NOTE — Telephone Encounter (Signed)
Called the patient back. Per the previous office visit he had discussed with Dr Brett Fairy about ordering a Travel CPAP. He has done some research and has found the one he would like. Dream station go. [employer]  [email or web address] is needing a script to start the process for the patient. Patient knows this is a out of pocket expense and knows that he will need to bring the machine to future office visit apts so we may download data from the machine. Patient is requesting that the order is sent to [email or web address]. Their fax [phone]. Please write this is RE: Weather man and order number [id number]. Advised I would enter the order and print the order for the patient and have someone fax on my behalf to the [email or web address]. patient was appreciative.

## 2019-02-08 NOTE — Telephone Encounter (Signed)
Order faxed and confirmed to the number below.

## 2019-02-09 NOTE — Telephone Encounter (Signed)
[email or web address] orders received, signed by Dr. Brett Fairy, and faxed back to [email or web address] @ [phone]. Received a receipt of confirmation.

## 2019-03-19 NOTE — Progress Notes (Signed)
CARDIOLOGY OFFICE NOTE  Date:  03/23/2019    James Rodgers Date of Birth: 1950/03/15 Medical Record [id number]  PCP:  Leanna Battles, MD  Cardiologist:  [facility]  Chief Complaint  Patient presents with  . Follow-up    History of Present Illness: James Rodgers is a 69 y.o. male who presents today for a 6 month check.  Seen for Dr. Saunders Revel. He is a former patient of Dr. Susa Simmonds as well as Dr. Claris Gladden. He would like to follow with me going forward.  He has a history ofmoderate nonobstructive coronary artery disease by catheterization in 2003, thoracic aortic aneurysm,hypertension,hyperlipidemia,and diabetes mellitus. He has been on chronic DAPT as part of his medical management of his CAD.   Last seen by Dr. Saunders Revel in [facility] 2019- felt to be doing ok. Some vague chest pain - but this has been chronic since his remote cath in 2003. He had noted some orthostasis while outside in the heat. BP was marginal at his visit but no medicine changes due to the occasional orthostasis.  Isaw him back in 22-Oct-2017 several times - he was very orthostatic followinghisknee surgery andin the setting of significantweight loss. Lots of medicines were stopped. I was able to wean off Midodrine.He ended up doing well - BP started tracking back up - we have had to restart medicines. He has been followed in the HTN clinic as well.   Previously tried:irbesartan 150 mg daily, minoxidil 5 mg qhs, hydrochlorothiazide (pt thought It was short term), amlodipine (unknown why it was stopped), valsartan.   Last seen here in Oct 23, 2022 - mom had just died - she was 61. He was doing well - had gained the "COVID weight". Cardiac status ok.   The patient does not have symptoms concerning for COVID-19 infection (fever, chills, cough, or new shortness of breath).   Comes in today. Here alone. Doing well. Not very active given the pandemic situation. No chest pain. Breathing is good. BP is  good. Very rare chest pain - that he has had for many years - totally unchanged. He feels like he is doing well.   Past Medical History:  Diagnosis Date  . Anal fissure   . Arthritis   . Basal cell carcinoma    neck  . CAD (coronary artery disease)    a. 2003 Cath: LAD 50, D1 50-60, RCA 40;  b. 12/2008 MV: no ischemia/infarct, EF 63%. // c. MV 8/17: mild inf-sept and apical ischemia, EF 53%, Low Risk  . DDD (degenerative disc disease), lumbar   . Diabetes type 2, controlled ([place])   . GERD (gastroesophageal reflux disease)   . Hemorrhoids   . History of dizziness   . History of nuclear stress test    a. Myoview 8/17: EF 53%, mild inferoseptal and mild apical ischemia, low risk  . HLD (hyperlipidemia)   . HTN (hypertension)   . LBP (low back pain)    a. h/o surgery in 1991.  . Obesity   . Orthostatic lightheadedness    with heat exposure  . Osteoarthritis    knees  . Perineal abscess Oct 22, 2012  . Pneumonia 10/22/2005  . PONV (postoperative nausea and vomiting)   . Renal cyst, right   . Retinopathy    Mild, left eye  . Right ureteral stone   . Seasonal allergic rhinitis   . Sleep apnea    a. on cpap.  [name] Spondylosis    lumbar  . Thoracic aortic  aneurysm ([place])    a. MRA 5/17 ascending thoracic aorta measuring 40 mm - recommend annual follow-up // b.  Echo 5/17: Moderate concentric LVH, EF 55-60%, normal wall motion, grade 1 diastolic dysfunction, ascending aortic diameter 45 mm, mild MR, mild LAE // c. Chest MRA 6/18: stable ascending aortic aneurysm measuring 4.1 cm (4.0 cm in 5/17). FU 1 year.    Past Surgical History:  Procedure Laterality Date  . anal fissure surgery  1981  . CARDIAC CATHETERIZATION  2004   1 vessel CAD  . COLONOSCOPY  [id number]  . KNEE ARTHROSCOPY Left   . LASIK    . [place]   x2  . NASAL SINUS SURGERY  2001  . NASAL SINUS SURGERY  2002  . TOTAL KNEE ARTHROPLASTY Left 09/12/2017   Procedure: LEFT TOTAL KNEE ARTHROPLASTY;  Surgeon:  Mcarthur Rossetti, MD;  Location: [facility];  Service: Orthopedics;  Laterality: Left;     Medications: Current Meds  Medication Sig  . aspirin EC 81 MG tablet Take 81 mg by mouth at bedtime.  [name] atorvastatin (LIPITOR) 80 MG tablet TAKE ONE TABLET BY MOUTH DAILY  . clopidogrel (PLAVIX) 75 MG tablet Take 75 mg by mouth daily.  [name] docusate sodium (COLACE) 100 MG capsule Take 100 mg by mouth at bedtime.  [name] esomeprazole (NEXIUM) 40 MG capsule Take 40 mg by mouth daily.    [name] ezetimibe (ZETIA) 10 MG tablet Take 10 mg by mouth daily.  [name] FARXIGA 10 MG TABS tablet Take 10 mg by mouth daily.   . fexofenadine (ALLEGRA) 180 MG tablet Take 180 mg by mouth daily as needed (for allergies).   . FIBER PO Take 1 capsule by mouth 2 (two) times daily.  . fluticasone (FLONASE) 50 MCG/ACT nasal spray Place 2 sprays into both nostrils daily as needed for allergies or rhinitis.  . metFORMIN (GLUCOPHAGE-XR) 500 MG 24 hr tablet Take 500 mg by mouth 2 (two) times daily.   [name] olmesartan (BENICAR) 40 MG tablet TAKE ONE TABLET BY MOUTH DAILY  . tamsulosin (FLOMAX) 0.4 MG CAPS capsule Take 0.4 mg by mouth at bedtime.      Allergies: Allergies  Allergen Reactions  . Codeine Other (See Comments)    dizzy    Social History: The patient  reports that he has never smoked. He has never used smokeless tobacco. He reports that he does not drink alcohol or use drugs.   Family History: The patient's family history includes Diabetes in an other family member; Heart disease in his father.   Review of Systems: Please see the history of present illness.   All other systems are reviewed and negative.   Physical Exam: VS:  BP 128/78   Pulse 72   Ht 6\' 6"  (1.981 m)   Wt 297 lb 12.8 oz (135.1 kg)   SpO2 96%   BMI 34.41 kg/m  .  BMI Body mass index is 34.41 kg/m.  Wt Readings from Last 3 Encounters:  03/23/19 297 lb 12.8 oz (135.1 kg)  12/01/18 299 lb (135.6 kg)  11/23/18 294 lb (133.4 kg)    General: Pleasant.  Well developed, well nourished and in no acute distress.  Large stature.  HEENT: Normal.  Neck: Supple, no JVD, carotid bruits, or masses noted.  Cardiac: Regular rate and rhythm. No murmurs, rubs, or gallops. No edema.  Respiratory:  Lungs are clear to auscultation bilaterally with normal work of breathing.  GI: Soft and nontender.  MS: No  deformity or atrophy. Gait and ROM intact.  Skin: Warm and dry. Color is normal.  Neuro:  Strength and sensation are intact and no gross focal deficits noted.  Psych: Alert, appropriate and with normal affect.   LABORATORY DATA:  EKG:  EKG is not ordered today. This demonstrates .  Lab Results  Component Value Date   WBC 8.6 10/01/2017   HGB 14.0 10/01/2017   HCT 43.4 10/01/2017   PLT 480 (H) 10/01/2017   GLUCOSE 129 (H) 09/23/2018   CHOL 129 11/26/2017   TRIG 104 11/26/2017   HDL 36 (L) 11/26/2017   LDLCALC 72 11/26/2017   ALT 26 11/26/2017   AST 14 11/26/2017   NA 145 (H) 09/23/2018   K 4.5 09/23/2018   CL 107 (H) 09/23/2018   CREATININE 0.88 09/23/2018   BUN 22 09/23/2018   CO2 21 09/23/2018   TSH 4.170 10/01/2017       BNP (last 3 results) No results for input(s): BNP in the last 8760 hours.  ProBNP (last 3 results) No results for input(s): PROBNP in the last 8760 hours.   Other Studies Reviewed Today:  MRA CHEST IMPRESSION 10/2018: VASCULAR  1. Stable 4.1 cm ascending thoracic aortic aneurysm. Recommend annual imaging followup by CTA or MRA. This recommendation follows 2010 ACCF/AHA/AATS/ACR/ASA/SCA/SCAI/SIR/STS/SVM Guidelines for the Diagnosis and Management of Patients with Thoracic Aortic Disease. Circulation. 2010; 121JN:9224643. Aortic aneurysm NOS (ICD10-I71.9)   Electronically Signed   By: Lucrezia Europe M.D.   On: 11/02/2018 11:06  MyoviewStudy Highlights8/2017    The left ventricular ejection fraction is mildly decreased (45-54%).  Nuclear stress EF: 53%.  There was no ST segment deviation  noted during stress.  Defect 1: There is a small defect of moderate severity present in the basal inferoseptal and mid inferoseptal location.  Defect 2: There is a small defect of mild severity present in the apex location.  This is a low risk study.  Low risk stress nuclear study with mild ischemia in the basal inferoseptal wall and mild ischemia in the apical wall; EF 53 with mild global hypokinesis and mild LVE.   EchoStudy Conclusions5/2017  - Left ventricle: The cavity size was normal. There was moderate concentric hypertrophy. Systolic function was normal. The estimated ejection fraction was in the range of 55% to 60%. Wall motion was normal; there were no regional wall motion abnormalities. Doppler parameters are consistent with abnormal left ventricular relaxation (grade 1 diastolic dysfunction). - Aortic valve: Trileaflet; mildly thickened, mildly calcified leaflets. - Aorta: Ascending aortic diameter: 45 mm (S). - Mitral valve: There was mild regurgitation. - Left atrium: The atrium was mildly dilated. - Right ventricle: The cavity size was mildly dilated. Wall thickness was normal  Assessment/Plan:  1. HTN - has had prior orthostasis - noted by pharmacy that since he is on Farxiga - avoid further diuretics that could lead to dehydration/orthostasis - fortunately, BP is good - no changes made today.   2. Non obstructive CAD - remains on chronic DAPT therapy - has done this for many years and has wished to continue - lab today. Needs aggressive CV risk factor modification.   3. HLD - on statin - lab today.   4. Thoracic aneurysm - scans from August noted - precautions given again - repeat later this year. He is aware.   5. COVID-19 Education: The signs and symptoms of COVID-19 were discussed with the patient and how to seek care for testing (follow up with PCP or arrange E-visit).  The importance of social distancing, staying at home, hand hygiene and  wearing a mask when out in public were discussed today.  Current medicines are reviewed with the patient today.  The patient does not have concerns regarding medicines other than what has been noted above.  The following changes have been made:  See above.  Labs/ tests ordered today include:    Orders Placed This Encounter  Procedures  . Basic metabolic panel  . CBC no Diff  . Hepatic function panel  . Lipid Profile  . TSH  . HgB A1c     Disposition:   FU with me in 6 months.   Patient is agreeable to this plan and will call if any problems develop in the interim.   SignedTruitt Merle, NP  03/23/2019 10:52 AM  [facility] [address] [place], [place]  [address] Phone: [phone] Fax: [phone]

## 2019-03-23 ENCOUNTER — Other Ambulatory Visit: Payer: Self-pay

## 2019-03-23 ENCOUNTER — Encounter: Payer: Self-pay | Admitting: Nurse Practitioner

## 2019-03-23 ENCOUNTER — Ambulatory Visit (INDEPENDENT_AMBULATORY_CARE_PROVIDER_SITE_OTHER): Payer: Medicare Other | Admitting: Nurse Practitioner

## 2019-03-23 VITALS — BP 128/78 | HR 72 | Ht 78.0 in | Wt 297.8 lb

## 2019-03-23 DIAGNOSIS — I2510 Atherosclerotic heart disease of native coronary artery without angina pectoris: Secondary | ICD-10-CM | POA: Diagnosis not present

## 2019-03-23 DIAGNOSIS — I712 Thoracic aortic aneurysm, without rupture, unspecified: Secondary | ICD-10-CM

## 2019-03-23 DIAGNOSIS — I10 Essential (primary) hypertension: Secondary | ICD-10-CM | POA: Diagnosis not present

## 2019-03-23 DIAGNOSIS — R7309 Other abnormal glucose: Secondary | ICD-10-CM

## 2019-03-23 DIAGNOSIS — E785 Hyperlipidemia, unspecified: Secondary | ICD-10-CM | POA: Diagnosis not present

## 2019-03-23 LAB — BASIC METABOLIC PANEL
BUN/Creatinine Ratio: 19 (ref 10–24)
BUN: 18 mg/dL (ref 8–27)
CO2: 19 mmol/L — ABNORMAL LOW (ref 20–29)
Calcium: 9.8 mg/dL (ref 8.6–10.2)
Chloride: 104 mmol/L (ref 96–106)
Creatinine, Ser: 0.96 mg/dL (ref 0.76–1.27)
GFR calc Af Amer: 93 mL/min/{1.73_m2} (ref >59)
GFR calc non Af Amer: 80 mL/min/{1.73_m2} (ref >59)
Glucose: 135 mg/dL — ABNORMAL HIGH (ref 65–99)
Potassium: 4.6 mmol/L (ref 3.5–5.2)
Sodium: 142 mmol/L (ref 134–144)

## 2019-03-23 LAB — HEPATIC FUNCTION PANEL
ALT: 79 IU/L — ABNORMAL HIGH (ref 0–44)
AST: 43 IU/L — ABNORMAL HIGH (ref 0–40)
Albumin: 4.4 g/dL (ref 3.8–4.8)
Alkaline Phosphatase: 88 IU/L (ref 39–117)
Bilirubin Total: 0.6 mg/dL (ref 0.0–1.2)
Bilirubin, Direct: 0.21 mg/dL (ref 0.00–0.40)
Total Protein: 6.7 g/dL (ref 6.0–8.5)

## 2019-03-23 LAB — HEMOGLOBIN A1C
Est. average glucose Bld gHb Est-mCnc: 154 mg/dL
Hgb A1c MFr Bld: 7 % — ABNORMAL HIGH (ref 4.8–5.6)

## 2019-03-23 LAB — LIPID PANEL
Chol/HDL Ratio: 3 ratio (ref 0.0–5.0)
Cholesterol, Total: 118 mg/dL (ref 100–199)
HDL: 40 mg/dL (ref >39)
LDL Chol Calc (NIH): 54 mg/dL (ref 0–99)
Triglycerides: 135 mg/dL (ref 0–149)
VLDL Cholesterol Cal: 24 mg/dL (ref 5–40)

## 2019-03-23 LAB — TSH: TSH: 2.8 u[IU]/mL (ref 0.450–4.500)

## 2019-03-23 LAB — CBC
Hematocrit: 46.3 % (ref 37.5–51.0)
Hemoglobin: 15.7 g/dL (ref 13.0–17.7)
MCH: 28 pg (ref 26.6–33.0)
MCHC: 33.9 g/dL (ref 31.5–35.7)
MCV: 83 fL (ref 79–97)
Platelets: 196 10*3/uL (ref 150–450)
RBC: 5.6 x10E6/uL (ref 4.14–5.80)
RDW: 13.1 % (ref 11.6–15.4)
WBC: 6.4 10*3/uL (ref 3.4–10.8)

## 2019-03-23 NOTE — Patient Instructions (Addendum)
After Visit Summary:  We will be checking the following labs today - A1C, BMET, CBC, HPF, Lipids and TSH   Medication Instructions:    Continue with your current medicines.    If you need a refill on your cardiac medications before your next appointment, please call your pharmacy.     Testing/Procedures To Be Arranged:  N/A  Follow-Up:   See me in 6 months    At [facility], you and your health needs are our priority.  As part of our continuing mission to provide you with exceptional heart care, we have created designated Provider Care Teams.  These Care Teams include your primary Cardiologist (physician) and Advanced Practice Providers (APPs -  Physician Assistants and Nurse Practitioners) who all work together to provide you with the care you need, when you need it.  Special Instructions:  . Stay safe, stay home, wash your hands for at least 20 seconds and wear a mask when out in public.  . It was good to talk with you today.    Call the [place] office at [phone] if you have any questions, problems or concerns.

## 2019-03-24 ENCOUNTER — Other Ambulatory Visit: Payer: Self-pay | Admitting: *Deleted

## 2019-03-24 DIAGNOSIS — R7989 Other specified abnormal findings of blood chemistry: Secondary | ICD-10-CM

## 2019-04-19 ENCOUNTER — Ambulatory Visit: Payer: Medicare Other

## 2019-04-28 ENCOUNTER — Other Ambulatory Visit: Payer: Self-pay

## 2019-04-28 ENCOUNTER — Other Ambulatory Visit: Payer: Medicare Other | Admitting: *Deleted

## 2019-04-28 DIAGNOSIS — R7989 Other specified abnormal findings of blood chemistry: Secondary | ICD-10-CM

## 2019-04-28 LAB — HEPATIC FUNCTION PANEL
ALT: 46 IU/L — ABNORMAL HIGH (ref 0–44)
AST: 29 IU/L (ref 0–40)
Albumin: 4.4 g/dL (ref 3.8–4.8)
Alkaline Phosphatase: 81 IU/L (ref 39–117)
Bilirubin Total: 0.5 mg/dL (ref 0.0–1.2)
Bilirubin, Direct: 0.16 mg/dL (ref 0.00–0.40)
Total Protein: 6.8 g/dL (ref 6.0–8.5)

## 2019-05-08 ENCOUNTER — Ambulatory Visit: Payer: Medicare Other

## 2019-06-15 ENCOUNTER — Other Ambulatory Visit: Payer: Self-pay | Admitting: Nurse Practitioner

## 2019-09-07 NOTE — Progress Notes (Signed)
CARDIOLOGY OFFICE NOTE  Date:  09/21/2019    James Rodgers Date of Birth: 1951-01-10 Medical Record #[id number]  PCP:  Leanna Battles, MD  Cardiologist:  [facility]   Chief Complaint  Patient presents with  . Follow-up    History of Present Illness: James Rodgers is a 69 y.o. male who presents today for a follow up visit.  Seen for Dr. Saunders Revel. He is a former patient of Dr. Susa Simmonds as well as Dr. Claris Gladden. He had asked to follow with me going forward.  He has a history ofmoderate nonobstructive coronary artery disease by catheterization in 2003, thoracic aortic aneurysm,hypertension,hyperlipidemia,and diabetes mellitus. He has been on chronic DAPT as part of his medical management of his CAD.   Last seen by Dr. Saunders Revel in [facility] 2019- felt to be doing ok. Some vague chest pain - but this has been chronic since his remote cath in 2003. He had noted some orthostasis while outside in the heat. BP was marginal at his visit but no medicine changes due to the occasional orthostasis.  Isaw him back in [place] times - he was very orthostatic followinghisknee surgery andin the setting of significantweight loss. Lots of medicines were stopped. I was able to wean off Midodrine.He ended up doing well - BP started tracking back up - we have had to restart medicines. He has been followed in the HTN clinic as well.   Previously tried:irbesartan 150 mg daily, minoxidil 5 mg qhs, hydrochlorothiazide (pt thought It was short term), amlodipine (unknown why it was stopped), valsartan.   Last seen in January - not very active due to the pandemic. Very rare chest pain - nothing any different than what he has had for many years. BP was good.   Comes in today. Here alone. He is doing well. No chest pain. Breathing is good. Has not been back to the gym but staying active at home and playing golf. He has been holding his Bp medicines the night before and day of  if playing golf - has sometimes done this two days in a row. Last BP he checked was 130/78. Does not check regularly. Needs labs. Weight is basically unchanged. Overall, he feels like he is doing ok.   Past Medical History:  Diagnosis Date  . Anal fissure   . Arthritis   . Basal cell carcinoma    neck  . CAD (coronary artery disease)    a. 2003 Cath: LAD 50, D1 50-60, RCA 40;  b. 12/2008 MV: no ischemia/infarct, EF 63%. // c. MV 8/17: mild inf-sept and apical ischemia, EF 53%, Low Risk  . DDD (degenerative disc disease), lumbar   . Diabetes type 2, controlled ([place])   . GERD (gastroesophageal reflux disease)   . Hemorrhoids   . History of dizziness   . History of nuclear stress test    a. Myoview 8/17: EF 53%, mild inferoseptal and mild apical ischemia, low risk  . HLD (hyperlipidemia)   . HTN (hypertension)   . LBP (low back pain)    a. h/o surgery in 1991.  . Obesity   . Orthostatic lightheadedness    with heat exposure  . Osteoarthritis    knees  . Perineal abscess 09/2012  . Pneumonia 09/2005  . PONV (postoperative nausea and vomiting)   . Renal cyst, right   . Retinopathy    Mild, left eye  . Right ureteral stone   . Seasonal allergic rhinitis   . Sleep  apnea    a. on cpap.  [name] Spondylosis    lumbar  . Thoracic aortic aneurysm ([place])    a. MRA 5/17 ascending thoracic aorta measuring 40 mm - recommend annual follow-up // b.  Echo 5/17: Moderate concentric LVH, EF 55-60%, normal wall motion, grade 1 diastolic dysfunction, ascending aortic diameter 45 mm, mild MR, mild LAE // c. Chest MRA 6/18: stable ascending aortic aneurysm measuring 4.1 cm (4.0 cm in 5/17). FU 1 year.    Past Surgical History:  Procedure Laterality Date  . anal fissure surgery  1981  . CARDIAC CATHETERIZATION  2004   1 vessel CAD  . COLONOSCOPY  [id number]  . KNEE ARTHROSCOPY Left   . LASIK    . [place]   x2  . NASAL SINUS SURGERY  2001  . NASAL SINUS SURGERY  2002  . TOTAL  KNEE ARTHROPLASTY Left 09/12/2017   Procedure: LEFT TOTAL KNEE ARTHROPLASTY;  Surgeon: Mcarthur Rossetti, MD;  Location: [facility];  Service: Orthopedics;  Laterality: Left;     Medications: Current Meds  Medication Sig  . aspirin EC 81 MG tablet Take 81 mg by mouth at bedtime.  [name] atorvastatin (LIPITOR) 80 MG tablet TAKE ONE TABLET BY MOUTH DAILY  . clopidogrel (PLAVIX) 75 MG tablet Take 75 mg by mouth daily.  [name] docusate sodium (COLACE) 100 MG capsule Take 100 mg by mouth at bedtime.  [name] esomeprazole (NEXIUM) 40 MG capsule Take 40 mg by mouth daily.    [name] ezetimibe (ZETIA) 10 MG tablet Take 10 mg by mouth daily.  [name] FARXIGA 10 MG TABS tablet Take 10 mg by mouth daily.   . fexofenadine (ALLEGRA) 180 MG tablet Take 180 mg by mouth daily as needed (for allergies).   . FIBER PO Take 1 capsule by mouth 2 (two) times daily.  . fluticasone (FLONASE) 50 MCG/ACT nasal spray Place 2 sprays into both nostrils daily as needed for allergies or rhinitis.  . metFORMIN (GLUCOPHAGE-XR) 500 MG 24 hr tablet Take 500 mg by mouth 2 (two) times daily.   [name] olmesartan (BENICAR) 40 MG tablet TAKE ONE TABLET BY MOUTH DAILY  . tamsulosin (FLOMAX) 0.4 MG CAPS capsule Take 0.4 mg by mouth at bedtime.      Allergies: Allergies  Allergen Reactions  . Codeine Other (See Comments)    dizzy    Social History: The patient  reports that he has never smoked. He has never used smokeless tobacco. He reports that he does not drink alcohol and does not use drugs.   Family History: The patient's family history includes Diabetes in an other family member; Heart disease in his father.   Review of Systems: Please see the history of present illness.   All other systems are reviewed and negative.   Physical Exam: VS:  BP 116/80   Pulse 68   Ht 6\' 5"  (1.956 m)   Wt 297 lb (134.7 kg)   SpO2 95%   BMI 35.22 kg/m  .  BMI Body mass index is 35.22 kg/m.  Wt Readings from Last 3 Encounters:  09/21/19 297 lb (134.7 kg)    03/23/19 297 lb 12.8 oz (135.1 kg)  12/01/18 299 lb (135.6 kg)    General: Alert and in no acute distress.  He is of large stature.  Cardiac: Regular rate and rhythm. No murmurs, rubs, or gallops. No edema.  Respiratory:  Lungs are clear to auscultation bilaterally with normal work of breathing.  GI: Soft  and nontender.  MS: No deformity or atrophy. Gait and ROM intact.  Skin: Warm and dry. Color is normal.  Neuro:  Strength and sensation are intact and no gross focal deficits noted.  Psych: Alert, appropriate and with normal affect.   LABORATORY DATA:  EKG:  EKG is ordered today.  Personally reviewed by me. This demonstrates sinus rhythm - no acute changes - HR is 68.  Lab Results  Component Value Date   WBC 6.4 03/23/2019   HGB 15.7 03/23/2019   HCT 46.3 03/23/2019   PLT 196 03/23/2019   GLUCOSE 135 (H) 03/23/2019   CHOL 118 03/23/2019   TRIG 135 03/23/2019   HDL 40 03/23/2019   LDLCALC 54 03/23/2019   ALT 46 (H) 04/28/2019   AST 29 04/28/2019   NA 142 03/23/2019   K 4.6 03/23/2019   CL 104 03/23/2019   CREATININE 0.96 03/23/2019   BUN 18 03/23/2019   CO2 19 (L) 03/23/2019   TSH 2.800 03/23/2019   HGBA1C 7.0 (H) 03/23/2019     BNP (last 3 results) No results for input(s): BNP in the last 8760 hours.  ProBNP (last 3 results) No results for input(s): PROBNP in the last 8760 hours.   Other Studies Reviewed Today:  MRA CHEST IMPRESSION 10/2018: VASCULAR  1. Stable 4.1 cm ascending thoracic aortic aneurysm. Recommend annual imaging followup by CTA or MRA. This recommendation follows 2010 ACCF/AHA/AATS/ACR/ASA/SCA/SCAI/SIR/STS/SVM Guidelines for the Diagnosis and Management of Patients with Thoracic Aortic Disease. Circulation. 2010; 121: V956-L875. Aortic aneurysm NOS (ICD10-I71.9)   Electronically Signed By: Lucrezia Europe M.D. On: 11/02/2018 11:06  MyoviewStudy Highlights8/2017    The left ventricular ejection fraction is mildly decreased  (45-54%).  Nuclear stress EF: 53%.  There was no ST segment deviation noted during stress.  Defect 1: There is a small defect of moderate severity present in the basal inferoseptal and mid inferoseptal location.  Defect 2: There is a small defect of mild severity present in the apex location.  This is a low risk study.  Low risk stress nuclear study with mild ischemia in the basal inferoseptal wall and mild ischemia in the apical wall; EF 53 with mild global hypokinesis and mild LVE.   EchoStudy Conclusions5/2017  - Left ventricle: The cavity size was normal. There was moderate concentric hypertrophy. Systolic function was normal. The estimated ejection fraction was in the range of 55% to 60%. Wall motion was normal; there were no regional wall motion abnormalities. Doppler parameters are consistent with abnormal left ventricular relaxation (grade 1 diastolic dysfunction). - Aortic valve: Trileaflet; mildly thickened, mildly calcified leaflets. - Aorta: Ascending aortic diameter: 45 mm (S). - Mitral valve: There was mild regurgitation. - Left atrium: The atrium was mildly dilated. - Right ventricle: The cavity size was mildly dilated. Wall thickness was normal  Assessment/Plan:  1. HTN - BP looks good - I have asked him to monitor when he is holding for his golf, especially when he plays golf for consecutive days.  He has had prior tendency towards orthostasis/dehydration. BP today looks good. Would continue Toprol and Benicar.   2. Non obstructive CAD - remains on chronic DAPT - has done this for many years and has wished to continue. Lab today.   3. HLD - on statin - lab today.   4. Thoracic aneurysm - scan needs to be updated next month - we will arrange - has good BP control. Precautions given in the past.   Current medicines are reviewed with the patient  today.  The patient does not have concerns regarding medicines other than what has been noted  above.  The following changes have been made:  See above.  Labs/ tests ordered today include:    Orders Placed This Encounter  Procedures  . MR Angiogram Chest W Wo Contrast  . Basic metabolic panel  . CBC  . Hepatic function panel  . Lipid panel  . Hemoglobin A1c  . EKG 12-Lead     Disposition:   FU with me in 6 months. Labs today. For now, no change in his current regimen.    Patient is agreeable to this plan and will call if any problems develop in the interim.   SignedTruitt Merle, NP  09/21/2019 11:03 AM  [facility] [address] [place], [place]  [address] Phone: [phone] Fax: [phone]

## 2019-09-21 ENCOUNTER — Other Ambulatory Visit: Payer: Self-pay

## 2019-09-21 ENCOUNTER — Encounter: Payer: Self-pay | Admitting: Nurse Practitioner

## 2019-09-21 ENCOUNTER — Ambulatory Visit: Payer: Medicare Other | Admitting: Nurse Practitioner

## 2019-09-21 VITALS — BP 116/80 | HR 68 | Ht 77.0 in | Wt 297.0 lb

## 2019-09-21 DIAGNOSIS — I712 Thoracic aortic aneurysm, without rupture, unspecified: Secondary | ICD-10-CM

## 2019-09-21 DIAGNOSIS — R7989 Other specified abnormal findings of blood chemistry: Secondary | ICD-10-CM

## 2019-09-21 DIAGNOSIS — R7309 Other abnormal glucose: Secondary | ICD-10-CM | POA: Diagnosis not present

## 2019-09-21 DIAGNOSIS — I2510 Atherosclerotic heart disease of native coronary artery without angina pectoris: Secondary | ICD-10-CM | POA: Diagnosis not present

## 2019-09-21 DIAGNOSIS — I10 Essential (primary) hypertension: Secondary | ICD-10-CM

## 2019-09-21 NOTE — Patient Instructions (Addendum)
After Visit Summary:  We will be checking the following labs today - BMET, CBC, HPF, Lipids, A1C   Medication Instructions:    Continue with your current medicines.    If you need a refill on your cardiac medications before your next appointment, please call your pharmacy.     Testing/Procedures To Be Arranged:  MRA of the chest/aorta for August to follow up aneurysm.   Follow-Up:   See me in January    At [facility], you and your health needs are our priority.  As part of our continuing mission to provide you with exceptional heart care, we have created designated Provider Care Teams.  These Care Teams include your primary Cardiologist (physician) and Advanced Practice Providers (APPs -  Physician Assistants and Nurse Practitioners) who all work together to provide you with the care you need, when you need it.  Special Instructions:  . Stay safe, wash your hands for at least 20 seconds and wear a mask when needed.  . It was good to talk with you today.    Call the [place] office at [phone] if you have any questions, problems or concerns.

## 2019-09-22 LAB — LIPID PANEL
Chol/HDL Ratio: 3.4 ratio (ref 0.0–5.0)
Cholesterol, Total: 124 mg/dL (ref 100–199)
HDL: 37 mg/dL — ABNORMAL LOW (ref >39)
LDL Chol Calc (NIH): 60 mg/dL (ref 0–99)
Triglycerides: 158 mg/dL — ABNORMAL HIGH (ref 0–149)
VLDL Cholesterol Cal: 27 mg/dL (ref 5–40)

## 2019-09-22 LAB — HEMOGLOBIN A1C
Est. average glucose Bld gHb Est-mCnc: 174 mg/dL
Hgb A1c MFr Bld: 7.7 % — ABNORMAL HIGH (ref 4.8–5.6)

## 2019-09-22 LAB — CBC
Hematocrit: 46.9 % (ref 37.5–51.0)
Hemoglobin: 15.7 g/dL (ref 13.0–17.7)
MCH: 27.5 pg (ref 26.6–33.0)
MCHC: 33.5 g/dL (ref 31.5–35.7)
MCV: 82 fL (ref 79–97)
Platelets: 236 10*3/uL (ref 150–450)
RBC: 5.7 x10E6/uL (ref 4.14–5.80)
RDW: 13.6 % (ref 11.6–15.4)
WBC: 6.9 10*3/uL (ref 3.4–10.8)

## 2019-09-22 LAB — BASIC METABOLIC PANEL
BUN/Creatinine Ratio: 17 (ref 10–24)
BUN: 18 mg/dL (ref 8–27)
CO2: 21 mmol/L (ref 20–29)
Calcium: 9.8 mg/dL (ref 8.6–10.2)
Chloride: 102 mmol/L (ref 96–106)
Creatinine, Ser: 1.06 mg/dL (ref 0.76–1.27)
GFR calc Af Amer: 82 mL/min/{1.73_m2} (ref >59)
GFR calc non Af Amer: 71 mL/min/{1.73_m2} (ref >59)
Glucose: 147 mg/dL — ABNORMAL HIGH (ref 65–99)
Potassium: 4.6 mmol/L (ref 3.5–5.2)
Sodium: 137 mmol/L (ref 134–144)

## 2019-09-22 LAB — HEPATIC FUNCTION PANEL
ALT: 56 IU/L — ABNORMAL HIGH (ref 0–44)
AST: 27 IU/L (ref 0–40)
Albumin: 4.7 g/dL (ref 3.8–4.8)
Alkaline Phosphatase: 92 IU/L (ref 48–121)
Bilirubin Total: 0.8 mg/dL (ref 0.0–1.2)
Bilirubin, Direct: 0.19 mg/dL (ref 0.00–0.40)
Total Protein: 7.5 g/dL (ref 6.0–8.5)

## 2019-11-03 ENCOUNTER — Other Ambulatory Visit: Payer: Self-pay

## 2019-11-03 ENCOUNTER — Ambulatory Visit (HOSPITAL_COMMUNITY)
Admission: RE | Admit: 2019-11-03 | Discharge: 2019-11-03 | Disposition: A | Discharge status: Home / Self Care | Payer: Medicare Other | Source: Ambulatory Visit | Attending: Nurse Practitioner | Admitting: Nurse Practitioner

## 2019-11-03 DIAGNOSIS — I712 Thoracic aortic aneurysm, without rupture, unspecified: Secondary | ICD-10-CM

## 2019-11-03 MED ORDER — GADOBUTROL 1 MMOL/ML IV SOLN
10.0000 mL | Freq: Once | INTRAVENOUS | Status: AC | PRN
  Administered 2019-11-03: 10 mL via INTRAVENOUS

## 2019-11-04 ENCOUNTER — Other Ambulatory Visit: Payer: Self-pay | Admitting: Nurse Practitioner

## 2019-11-04 DIAGNOSIS — I712 Thoracic aortic aneurysm, without rupture, unspecified: Secondary | ICD-10-CM

## 2019-11-05 ENCOUNTER — Telehealth: Payer: Self-pay | Admitting: Nurse Practitioner

## 2019-11-05 NOTE — Telephone Encounter (Signed)
Patient calling in regards to a call he received recently to schedule an appointment with Dr. Cyndia Bent due to his CT test results. He states he has not received the results yet and would like to know if he should be concerned about them. He would like a call back to explain everything.

## 2019-11-05 NOTE — Telephone Encounter (Signed)
Per Truitt Merle NP, Ok to report. He has had some increase in the size of his aneurysm. Would like to refer on to [facility] - Dr. Cyndia Bent if possible to start following. Order has been placed.   Called patient back about results. Patient already has appointment with Dr. Cyndia Bent in September. Encouraged patient to keep appointment and if he has any symptoms that come up to give our office a call. Patient asked if he should be concerned. Informed patient that right now we want to get him established with Dr. Cyndia Bent, and get an idea of his plan of care for his aneurysm. Patient verbalized understanding.

## 2019-11-17 ENCOUNTER — Other Ambulatory Visit: Payer: Self-pay

## 2019-11-17 ENCOUNTER — Encounter: Payer: Self-pay | Admitting: Surgery

## 2019-11-17 ENCOUNTER — Institutional Professional Consult (permissible substitution): Payer: Medicare Other | Admitting: Surgery

## 2019-11-17 VITALS — BP 127/83 | HR 90 | Resp 20 | Ht 77.0 in | Wt 292.0 lb

## 2019-11-17 DIAGNOSIS — I712 Thoracic aortic aneurysm, without rupture, unspecified: Secondary | ICD-10-CM

## 2019-11-17 NOTE — Progress Notes (Signed)
Cardiothoracic Surgery Consultation  PCP is Leanna Battles, MD Referring Provider is Burtis Junes, NP  Chief Complaint  Patient presents with  . Thoracic Aortic Aneurysm    Surgical consult, MRA Chest 11/04/19     HPI:  The patient is a 69 year old gentleman with history of hypertension, hyperlipidemia, type 2 diabetes, orthostatic hypotension and moderate nonobstructive coronary artery disease who also has an ascending thoracic aortic aneurysm that is being followed with MRA since 2017 when it was measured at 4.0 cm.  He had an MRA of the chest on 11/02/2018 and the ascending aortic aneurysm was measured at 4.1 cm in the mid ascending aorta by radiology.  His most recent follow-up MRA on 11/04/2019 showed the maximum diameter of the ascending aortic aneurysm to measure 4.5 cm.  The radiologist who reviewed this scan also remeasured the aneurysm on the scan done last year and felt that it was 4.3 cm last year.  The patient denies any family history of thoracic or abdominal aortic aneurysm.  There is no family history of bicuspid aortic valve disease or connective tissue disorder.  There is no family history of aortic dissection.  Past Medical History:  Diagnosis Date  . Anal fissure   . Arthritis   . Basal cell carcinoma    neck  . CAD (coronary artery disease)    a. 2003 Cath: LAD 50, D1 50-60, RCA 40;  b. 12/2008 MV: no ischemia/infarct, EF 63%. // c. MV 8/17: mild inf-sept and apical ischemia, EF 53%, Low Risk  . DDD (degenerative disc disease), lumbar   . Diabetes type 2, controlled ([place])   . GERD (gastroesophageal reflux disease)   . Hemorrhoids   . History of dizziness   . History of nuclear stress test    a. Myoview 8/17: EF 53%, mild inferoseptal and mild apical ischemia, low risk  . HLD (hyperlipidemia)   . HTN (hypertension)   . LBP (low back pain)    a. h/o surgery in 1991.  . Obesity   . Orthostatic lightheadedness    with heat exposure  . Osteoarthritis     knees  . Perineal abscess 09/2012  . Pneumonia 09/2005  . PONV (postoperative nausea and vomiting)   . Renal cyst, right   . Retinopathy    Mild, left eye  . Right ureteral stone   . Seasonal allergic rhinitis   . Sleep apnea    a. on cpap.  [name] Spondylosis    lumbar  . Thoracic aortic aneurysm ([place])    a. MRA 5/17 ascending thoracic aorta measuring 40 mm - recommend annual follow-up // b.  Echo 5/17: Moderate concentric LVH, EF 55-60%, normal wall motion, grade 1 diastolic dysfunction, ascending aortic diameter 45 mm, mild MR, mild LAE // c. Chest MRA 6/18: stable ascending aortic aneurysm measuring 4.1 cm (4.0 cm in 5/17). FU 1 year.    Past Surgical History:  Procedure Laterality Date  . anal fissure surgery  1981  . CARDIAC CATHETERIZATION  2004   1 vessel CAD  . COLONOSCOPY  [id number]  . KNEE ARTHROSCOPY Left   . LASIK    . [place]   x2  . NASAL SINUS SURGERY  2001  . NASAL SINUS SURGERY  2002  . TOTAL KNEE ARTHROPLASTY Left 09/12/2017   Procedure: LEFT TOTAL KNEE ARTHROPLASTY;  Surgeon: Mcarthur Rossetti, MD;  Location: [facility];  Service: Orthopedics;  Laterality: Left;    Family History  Problem Relation Age  of Onset  . Heart disease Father   . Diabetes Other        GM    Social History Social History   Tobacco Use  . Smoking status: Never Smoker  . Smokeless tobacco: Never Used  Vaping Use  . Vaping Use: Never used  Substance Use Topics  . Alcohol use: No  . Drug use: No    Current Outpatient Medications  Medication Sig Dispense Refill  . aspirin EC 81 MG tablet Take 81 mg by mouth at bedtime.    [name] atorvastatin (LIPITOR) 80 MG tablet TAKE ONE TABLET BY MOUTH DAILY 90 tablet 1  . clopidogrel (PLAVIX) 75 MG tablet Take 75 mg by mouth daily.    [name] docusate sodium (COLACE) 100 MG capsule Take 100 mg by mouth at bedtime.    [name] esomeprazole (NEXIUM) 40 MG capsule Take 40 mg by mouth daily.      [name] ezetimibe (ZETIA) 10 MG tablet Take 10 mg  by mouth daily.    [name] FARXIGA 10 MG TABS tablet Take 10 mg by mouth daily.     . fexofenadine (ALLEGRA) 180 MG tablet Take 180 mg by mouth daily as needed (for allergies).     . FIBER PO Take 1 capsule by mouth 2 (two) times daily.    . fluticasone (FLONASE) 50 MCG/ACT nasal spray Place 2 sprays into both nostrils daily as needed for allergies or rhinitis.    . metFORMIN (GLUCOPHAGE-XR) 500 MG 24 hr tablet Take 500 mg by mouth 2 (two) times daily.     [name] olmesartan (BENICAR) 40 MG tablet TAKE ONE TABLET BY MOUTH DAILY 90 tablet 3  . tamsulosin (FLOMAX) 0.4 MG CAPS capsule Take 0.4 mg by mouth at bedtime.     . metoprolol succinate (TOPROL-XL) 25 MG 24 hr tablet Take 1 tablet (25 mg total) by mouth daily. Take with or immediately following a meal. 90 tablet 3   No current facility-administered medications for this visit.    Allergies  Allergen Reactions  . Codeine Other (See Comments)    dizzy    Review of Systems  Constitutional: Positive for activity change. Negative for fatigue.  HENT: Negative.   Eyes:       Floaters  Respiratory:       OSA and uses CPAP at night  Cardiovascular: Negative.   Gastrointestinal:       Hiatal hernia  Endocrine: Negative.   Genitourinary: Positive for frequency.  Musculoskeletal: Positive for arthralgias.  Skin: Negative.   Allergic/Immunologic: Negative.   Neurological: Positive for dizziness. Negative for syncope.  Hematological: Negative.   Psychiatric/Behavioral: Negative.     BP 127/83   Pulse 90   Resp 20   Ht 6\' 5"  (1.956 m)   Wt 292 lb (132.5 kg)   SpO2 93% Comment: RA  BMI 34.63 kg/m  Physical Exam Constitutional:      Appearance: Normal appearance. He is obese.  HENT:     Head: Normocephalic and atraumatic.  Eyes:     Extraocular Movements: Extraocular movements intact.     Conjunctiva/sclera: Conjunctivae normal.     Pupils: Pupils are equal, round, and reactive to light.  Neck:     Vascular: No carotid bruit.    Cardiovascular:     Rate and Rhythm: Normal rate and regular rhythm.     Heart sounds: Normal heart sounds. No murmur heard.   Pulmonary:     Effort: Pulmonary effort is normal.     Breath  sounds: Normal breath sounds.  Musculoskeletal:        General: No swelling.  Skin:    General: Skin is warm and dry.  Neurological:     General: No focal deficit present.     Mental Status: He is alert and oriented to person, place, and time.  Psychiatric:        Mood and Affect: Mood normal.        Behavior: Behavior normal.      Diagnostic Tests:  Narrative & Impression  CLINICAL DATA:  Thoracic aortic aneurysm, follow-up  EXAM: MRA CHEST WITH OR WITHOUT CONTRAST  TECHNIQUE: Angiographic images of the chest were obtained using MRA technique without and with intravenous contrast.  CONTRAST:  42mL GADAVIST GADOBUTROL 1 MMOL/ML IV SOLN  COMPARISON:  11/02/2018  FINDINGS: Cardiovascular:  Heart size normal. No pericardial effusion. Central pulmonary arteries normal in caliber. No aortic dissection or stenosis.  Aortic Root:  --Valve: 2.9 cm  --Sinuses: 4.1 cm  --Sinotubular Junction: 3.3 cm  Limitations by motion: Minimal  Thoracic Aorta:  --Ascending Aorta: 4.5 cm (previously 4.3 by my measurement)  --Aortic Arch: 3.7 cm  --Descending Aorta: 3.1  Classic 3 vessel brachiocephalic arterial origin anatomy without proximal stenosis. No significant atheromatous irregularity.  Mediastinum/Nodes: No mass or adenopathy.  Lungs/Pleura: No pleural effusion.  No pulmonary mass evident.  Upper Abdomen: Negative limited evaluation  Musculoskeletal: No bone lesion identified  Review of the MIP images confirms the above findings.  IMPRESSION: 1. 4.5 cm ascending thoracic aortic aneurysm, previously 4.3. Recommend semi-annual imaging followup by CTA or MRA and referral to cardiothoracic surgery if not already obtained. This recommendation follows  2010 ACCF/AHA/AATS/ACR/ASA/SCA/SCAI/SIR/STS/SVM Guidelines for the Diagnosis and Management of Patients With Thoracic Aortic Disease. Circulation. 2010; 121: O671-I458 2. No acute findings.   Electronically Signed   By: Lucrezia Europe M.D.   On: 11/04/2019 10:22    Impression:  This 69 year old gentleman has a 4.5 cm fusiform ascending aortic aneurysm with a 3.1 cm descending aorta.  He is a 6 foot 5 inch tall,  292 pound gentleman with the size and perspective.  The ascending aortic aneurysm measured 4.3 cm on his prior MRA of the chest on 11/02/2018 and measured 4 cm by MRI on 08/02/2015.  I have personally reviewed all the studies and done the measurements myself so I think they are accurate.  There is been a 5 mm increase in size over the past 4-1/2 years.  This is still well below the surgical threshold of 5.5 cm in this patient with a normal trileaflet aortic valve.  I reviewed the MRA images with the patient and his wife and answered all their questions.  I stressed the importance of continued good blood pressure control in preventing further enlargement and acute aortic dissection.  He has had problems with orthostatic hypotension and dizziness for quite some time and said that he frequently has to hold his blood pressure medication if he is going to be physically active the next day.  He does check his blood pressure at home and said it is usually 130/80 or less.  I have recommended that he have a follow-up MRA in 1 year.  He is in agreement with that.   Plan:  I will plan to see him back in 1 year with an MRA of the chest to follow-up on his ascending aortic aneurysm.  I spent 30 minutes performing this consultation and > 50% of this time was spent face to face counseling  and coordinating the care of this patient's ascending aortic aneurysm   Gaye Pollack, MD [facility] [phone]

## 2019-12-06 ENCOUNTER — Other Ambulatory Visit: Payer: Self-pay

## 2019-12-06 ENCOUNTER — Ambulatory Visit: Payer: Medicare Other | Admitting: Adult Health

## 2019-12-06 ENCOUNTER — Encounter: Payer: Self-pay | Admitting: Adult Health

## 2019-12-06 VITALS — BP 119/81 | HR 85 | Ht 77.0 in | Wt 300.2 lb

## 2019-12-06 DIAGNOSIS — Z9989 Dependence on other enabling machines and devices: Secondary | ICD-10-CM | POA: Diagnosis not present

## 2019-12-06 DIAGNOSIS — G4733 Obstructive sleep apnea (adult) (pediatric): Secondary | ICD-10-CM | POA: Diagnosis not present

## 2019-12-06 NOTE — Progress Notes (Signed)
PATIENT: James Rodgers DOB: [birth year]  REASON FOR VISIT: follow up HISTORY FROM: patient  HISTORY OF PRESENT ILLNESS: Today 12/06/19:  Mr. Casale is a 69 year old male with a history of obstructive sleep apnea on CPAP.  His download indicates that he uses machine 29 out of 30 days for compliance of 97%.  He uses machine greater than 4 hours each night.  On average he uses his machine 7 hours and 34 minutes.  His residual AHI is 0.9 on 8 to 15 cm of water with EPR of three.  Leak in the 95th percentile is 12.5 L/min.  Reports that his CPAP is working well for him.  He also has a travel machine that he uses.  He returns today for an evaluation.  HISTORY 12-01-2018, Mr mantz is a meanwhile 69 year old caucasian right handed male patient , following up for CPAP compliance.  Mr. holliman still reports 1 or 2 nocturnal use by using CPAP, but he has been extremely compliant patient.  He is kind 30-day download encompassing the time between 18 August on 25 November 2018 shows 100% compliance by days and hours.  Average user time 7 hours 10 minutes.  The air sense 10 AutoSet gives a pressure window between 8 and 15 cmH2O with 3 cm EPR, residual AHI is 0.7/h which speaks for an excellent resolution.  The 95th percentile pressure is 9.3 so well within the current settings.  He endorsed the Epworth sleepiness score at 5 out of 25 points on the severe fatigue severity at 20 out of 63 points.  Both would be in normal limits his blood pressure today is 132/84 mmHg with a regular pulse of 62.  His current BMI is 35.4  REVIEW OF SYSTEMS: Out of a complete 14 system review of symptoms, the patient complains only of the following symptoms, and all other reviewed systems are negative.  FSS 17 ESS 7  ALLERGIES: Allergies  Allergen Reactions  . Codeine Other (See Comments)    dizzy    HOME MEDICATIONS: Outpatient Medications Prior to Visit  Medication Sig Dispense Refill  . aspirin EC 81  MG tablet Take 81 mg by mouth at bedtime.    [name] atorvastatin (LIPITOR) 80 MG tablet TAKE ONE TABLET BY MOUTH DAILY 90 tablet 1  . clopidogrel (PLAVIX) 75 MG tablet Take 75 mg by mouth daily.    [name] docusate sodium (COLACE) 100 MG capsule Take 100 mg by mouth at bedtime.    [name] esomeprazole (NEXIUM) 40 MG capsule Take 40 mg by mouth daily.      [name] ezetimibe (ZETIA) 10 MG tablet Take 10 mg by mouth daily.    [name] FARXIGA 10 MG TABS tablet Take 10 mg by mouth daily.     . fexofenadine (ALLEGRA) 180 MG tablet Take 180 mg by mouth daily as needed (for allergies).     . FIBER PO Take 1 capsule by mouth 2 (two) times daily.    . fluticasone (FLONASE) 50 MCG/ACT nasal spray Place 2 sprays into both nostrils daily as needed for allergies or rhinitis.    . metFORMIN (GLUCOPHAGE-XR) 500 MG 24 hr tablet Take 500 mg by mouth 2 (two) times daily.     [name] olmesartan (BENICAR) 40 MG tablet TAKE ONE TABLET BY MOUTH DAILY 90 tablet 3  . tamsulosin (FLOMAX) 0.4 MG CAPS capsule Take 0.4 mg by mouth at bedtime.     . metoprolol succinate (TOPROL-XL) 25 MG 24 hr tablet Take 1 tablet (  25 mg total) by mouth daily. Take with or immediately following a meal. 90 tablet 3   No facility-administered medications prior to visit.    PAST MEDICAL HISTORY: Past Medical History:  Diagnosis Date  . Anal fissure   . Arthritis   . Basal cell carcinoma    neck  . CAD (coronary artery disease)    a. 2003 Cath: LAD 50, D1 50-60, RCA 40;  b. 12/2008 MV: no ischemia/infarct, EF 63%. // c. MV 8/17: mild inf-sept and apical ischemia, EF 53%, Low Risk  . DDD (degenerative disc disease), lumbar   . Diabetes type 2, controlled ([place])   . GERD (gastroesophageal reflux disease)   . Hemorrhoids   . History of dizziness   . History of nuclear stress test    a. Myoview 8/17: EF 53%, mild inferoseptal and mild apical ischemia, low risk  . HLD (hyperlipidemia)   . HTN (hypertension)   . LBP (low back pain)    a. h/o surgery in 1991.  . Obesity    . Orthostatic lightheadedness    with heat exposure  . Osteoarthritis    knees  . Perineal abscess 09/2012  . Pneumonia 09/2005  . PONV (postoperative nausea and vomiting)   . Renal cyst, right   . Retinopathy    Mild, left eye  . Right ureteral stone   . Seasonal allergic rhinitis   . Sleep apnea    a. on cpap.  [name] Spondylosis    lumbar  . Thoracic aortic aneurysm ([place])    a. MRA 5/17 ascending thoracic aorta measuring 40 mm - recommend annual follow-up // b.  Echo 5/17: Moderate concentric LVH, EF 55-60%, normal wall motion, grade 1 diastolic dysfunction, ascending aortic diameter 45 mm, mild MR, mild LAE // c. Chest MRA 6/18: stable ascending aortic aneurysm measuring 4.1 cm (4.0 cm in 5/17). FU 1 year.    PAST SURGICAL HISTORY: Past Surgical History:  Procedure Laterality Date  . anal fissure surgery  1981  . CARDIAC CATHETERIZATION  2004   1 vessel CAD  . COLONOSCOPY  [id number]  . KNEE ARTHROSCOPY Left   . LASIK    . [place]   x2  . NASAL SINUS SURGERY  2001  . NASAL SINUS SURGERY  2002  . TOTAL KNEE ARTHROPLASTY Left 09/12/2017   Procedure: LEFT TOTAL KNEE ARTHROPLASTY;  Surgeon: Mcarthur Rossetti, MD;  Location: [facility];  Service: Orthopedics;  Laterality: Left;    FAMILY HISTORY: Family History  Problem Relation Age of Onset  . Heart disease Father   . Diabetes Other        GM    SOCIAL HISTORY: Social History   Socioeconomic History  . Marital status: Married    Spouse name: Not on file  . Number of children: Not on file  . Years of education: Not on file  . Highest education level: Not on file  Occupational History  . Not on file  Tobacco Use  . Smoking status: Never Smoker  . Smokeless tobacco: Never Used  Vaping Use  . Vaping Use: Never used  Substance and Sexual Activity  . Alcohol use: No  . Drug use: No  . Sexual activity: Not on file  Other Topics Concern  . Not on file  Social History Narrative   Married;  retired Agricultural consultant; daily caffeine use.  Lives with wife in [place]. Goes to the gym about 2 times a week.   Social Determinants of Health  Financial Resource Strain:   . Difficulty of Paying Living Expenses: Not on file  Food Insecurity:   . Worried About Charity fundraiser in the Last Year: Not on file  . Ran Out of Food in the Last Year: Not on file  Transportation Needs:   . Lack of Transportation (Medical): Not on file  . Lack of Transportation (Non-Medical): Not on file  Physical Activity:   . Days of Exercise per Week: Not on file  . Minutes of Exercise per Session: Not on file  Stress:   . Feeling of Stress : Not on file  Social Connections:   . Frequency of Communication with Friends and Family: Not on file  . Frequency of Social Gatherings with Friends and Family: Not on file  . Attends Religious Services: Not on file  . Active Member of Clubs or Organizations: Not on file  . Attends Archivist Meetings: Not on file  . Marital Status: Not on file  Intimate Partner Violence:   . Fear of Current or Ex-Partner: Not on file  . Emotionally Abused: Not on file  . Physically Abused: Not on file  . Sexually Abused: Not on file      PHYSICAL EXAM  Vitals:   12/06/19 1018  BP: 119/81  Pulse: 85  Weight: (!) 300 lb 3.2 oz (136.2 kg)  Height: 6\' 5"  (1.956 m)   Body mass index is 35.6 kg/m.  Generalized: Well developed, in no acute distress  Chest: Lungs clear to auscultation bilaterally  Neurological examination  Mentation: Alert oriented to time, place, history taking. Follows all commands speech and language fluent Cranial nerve II-XII: Extraocular movements were full, visual field were full on confrontational test Head turning and shoulder shrug  were normal and symmetric. Motor: The motor testing reveals 5 over 5 strength of all 4 extremities. Good symmetric motor tone is noted throughout.  Sensory: Sensory testing is intact to soft touch on all 4  extremities. No evidence of extinction is noted.  Gait and station: Gait is normal.    DIAGNOSTIC DATA (LABS, IMAGING, TESTING) - I reviewed patient records, labs, notes, testing and imaging myself where available.  Lab Results  Component Value Date   WBC 6.9 09/21/2019   HGB 15.7 09/21/2019   HCT 46.9 09/21/2019   MCV 82 09/21/2019   PLT 236 09/21/2019      Component Value Date/Time   NA 137 09/21/2019 1122   K 4.6 09/21/2019 1122   CL 102 09/21/2019 1122   CO2 21 09/21/2019 1122   GLUCOSE 147 (H) 09/21/2019 1122   GLUCOSE 168 (H) 09/13/2017 0505   BUN 18 09/21/2019 1122   CREATININE 1.06 09/21/2019 1122   CALCIUM 9.8 09/21/2019 1122   PROT 7.5 09/21/2019 1122   ALBUMIN 4.7 09/21/2019 1122   AST 27 09/21/2019 1122   ALT 56 (H) 09/21/2019 1122   ALKPHOS 92 09/21/2019 1122   BILITOT 0.8 09/21/2019 1122   GFRNONAA 71 09/21/2019 1122   GFRAA 82 09/21/2019 1122   Lab Results  Component Value Date   CHOL 124 09/21/2019   HDL 37 (L) 09/21/2019   LDLCALC 60 09/21/2019   TRIG 158 (H) 09/21/2019   CHOLHDL 3.4 09/21/2019   Lab Results  Component Value Date   HGBA1C 7.7 (H) 09/21/2019    Lab Results  Component Value Date   TSH 2.800 03/23/2019      ASSESSMENT AND PLAN 69 y.o. year old male  has a past medical history  of Anal fissure, Arthritis, Basal cell carcinoma, CAD (coronary artery disease), DDD (degenerative disc disease), lumbar, Diabetes type 2, controlled ([place]), GERD (gastroesophageal reflux disease), Hemorrhoids, History of dizziness, History of nuclear stress test, HLD (hyperlipidemia), HTN (hypertension), LBP (low back pain), Obesity, Orthostatic lightheadedness, Osteoarthritis, Perineal abscess (09/2012), Pneumonia (09/2005), PONV (postoperative nausea and vomiting), Renal cyst, right, Retinopathy, Right ureteral stone, Seasonal allergic rhinitis, Sleep apnea, Spondylosis, and Thoracic aortic aneurysm ([place]). here with:  1. OSA on CPAP  - CPAP compliance  excellent - Good treatment of AHI  - Encourage patient to use CPAP nightly and > 4 hours each night - F/U in 1 year or sooner if needed   I spent25 minutes of face-to-face and non-face-to-face time with patient.  This included previsit chart review, lab review, study review, order entry, electronic health record documentation, patient education.  Ward Givens, MSN, NP-C 12/06/2019, 10:23 AM [facility] [address], [place], [place] [address] [phone]

## 2019-12-06 NOTE — Patient Instructions (Signed)
Continue using CPAP nightly and greater than 4 hours each night °If your symptoms worsen or you develop new symptoms please let us know.  ° °

## 2019-12-11 ENCOUNTER — Other Ambulatory Visit: Payer: Self-pay | Admitting: Nurse Practitioner

## 2020-03-15 NOTE — Progress Notes (Signed)
CARDIOLOGY OFFICE NOTE  Date:  03/21/2020    James Rodgers Date of Birth: 11-Jan-1951 Medical Record [id number]  PCP:  Leanna Battles, MD  Cardiologist:  [facility]    Chief Complaint  Patient presents with  . Follow-up    History of Present Illness: James Rodgers is a 70 y.o. male who presents today for a follow up visit. Seen for Dr. Saunders Revel. He is a former patient of Dr. Susa Simmonds as well as Dr. Claris Gladden. He had asked to follow with me going forward.    He has a history of moderate nonobstructive coronary artery disease by catheterization in 2003, thoracic aortic aneurysm, hypertension, hyperlipidemia, and diabetes mellitus. He has been on chronic DAPT as part of his medical management of his CAD.    Last seen by Dr. Saunders Revel in March of 2019 - felt to be doing ok. Some vague chest pain - but this has been chronic since his remote cath in 2003. He had noted some orthostasis while outside in the heat. BP was marginal at his visit but no medicine changes due to the occasional orthostasis.    I saw him back in July of 2019 several times - he was very orthostatic following his knee surgery and in the setting of significant weight loss. Lots of medicines were stopped. I was able to wean off Midodrine. He ended up doing well - BP started tracking back up - we have had to restart medicines. He has been followed in the HTN clinic as well.    Previously tried: irbesartan 150 mg daily, minoxidil 5 mg qhs, hydrochlorothiazide (pt thought It was short term), amlodipine (unknown why it was stopped) and valsartan.    When seen last January - not very active due to the pandemic. Very rare chest pain - nothing any different than what he has had for many years. BP was good. Last seen in July - continuing to do well. BP ok.    Comes in today. Here alone. He is doing well. Has lost some weight. His aorta now followed by Dr. Cyndia Bent. BP looks good. Feels good on his medicines. Has added some  Metamucil to his regimen for better bowel movements. Not dizzy. No chest pain. Breathing is good. He has no real concerns and feels like he is doing well.    Past Medical History:  Diagnosis Date  . Anal fissure   . Arthritis   . Basal cell carcinoma    neck  . CAD (coronary artery disease)    a. 2003 Cath: LAD 50, D1 50-60, RCA 40;  b. 12/2008 MV: no ischemia/infarct, EF 63%. // c. MV 8/17: mild inf-sept and apical ischemia, EF 53%, Low Risk  . DDD (degenerative disc disease), lumbar   . Diabetes type 2, controlled ([place])   . GERD (gastroesophageal reflux disease)   . Hemorrhoids   . History of dizziness   . History of nuclear stress test    a. Myoview 8/17: EF 53%, mild inferoseptal and mild apical ischemia, low risk  . HLD (hyperlipidemia)   . HTN (hypertension)   . LBP (low back pain)    a. h/o surgery in 1991.  . Obesity   . Orthostatic lightheadedness    with heat exposure  . Osteoarthritis    knees  . Perineal abscess 09/2012  . Pneumonia 09/2005  . PONV (postoperative nausea and vomiting)   . Renal cyst, right   . Retinopathy    Mild, left eye  .  Right ureteral stone   . Seasonal allergic rhinitis   . Sleep apnea    a. on cpap.  [name] Spondylosis    lumbar  . Thoracic aortic aneurysm (HCC)    a. MRA 5/17 ascending thoracic aorta measuring 40 mm - recommend annual follow-up // b.  Echo 5/17: Moderate concentric LVH, EF 55-60%, normal wall motion, grade 1 diastolic dysfunction, ascending aortic diameter 45 mm, mild MR, mild LAE // c. Chest MRA 6/18: stable ascending aortic aneurysm measuring 4.1 cm (4.0 cm in 5/17). FU 1 year.    Past Surgical History:  Procedure Laterality Date  . anal fissure surgery  1981  . CARDIAC CATHETERIZATION  2004   1 vessel CAD  . COLONOSCOPY  8938,1017  . KNEE ARTHROSCOPY Left   . LASIK    . LUMBAR SPINE SURGERY  1990   x2  . NASAL SINUS SURGERY  2001  . NASAL SINUS SURGERY  2002  . TOTAL KNEE ARTHROPLASTY Left 09/12/2017   Procedure:  LEFT TOTAL KNEE ARTHROPLASTY;  Surgeon: Kathryne Hitch, MD;  Location: [facility];  Service: Orthopedics;  Laterality: Left;     Medications: Current Meds  Medication Sig  . aspirin EC 81 MG tablet Take 81 mg by mouth at bedtime.  [name] atorvastatin (LIPITOR) 80 MG tablet TAKE ONE TABLET BY MOUTH DAILY  . clopidogrel (PLAVIX) 75 MG tablet Take 75 mg by mouth daily.  [name] docusate sodium (COLACE) 100 MG capsule Take 100 mg by mouth at bedtime.  [name] esomeprazole (NEXIUM) 40 MG capsule Take 40 mg by mouth daily.  [name] ezetimibe (ZETIA) 10 MG tablet Take 10 mg by mouth daily.  [name] FARXIGA 10 MG TABS tablet Take 10 mg by mouth daily.   . fexofenadine (ALLEGRA) 180 MG tablet Take 180 mg by mouth daily as needed (for allergies).  . FIBER PO Take 1 capsule by mouth 2 (two) times daily.  . fluorouracil (EFUDEX) 5 % cream Apply topically 2 (two) times daily. 2 weeks only - pre-skin cancer on face  . fluticasone (FLONASE) 50 MCG/ACT nasal spray Place 2 sprays into both nostrils daily as needed for allergies or rhinitis.  . metFORMIN (GLUCOPHAGE-XR) 500 MG 24 hr tablet Take 500 mg by mouth 2 (two) times daily.   [name] olmesartan (BENICAR) 40 MG tablet TAKE ONE TABLET BY MOUTH DAILY  . tamsulosin (FLOMAX) 0.4 MG CAPS capsule Take 0.4 mg by mouth at bedtime.      Allergies: Allergies  Allergen Reactions  . Codeine Other (See Comments)    dizzy    Social History: The patient  reports that he has never smoked. He has never used smokeless tobacco. He reports that he does not drink alcohol and does not use drugs.   Family History: The patient's family history includes Diabetes in an other family member; Heart disease in his father.   Review of Systems: Please see the history of present illness.   All other systems are reviewed and negative.   Physical Exam: VS:  BP 130/80   Pulse 79   Ht 6\' 5"  (1.956 m)   Wt 290 lb (131.5 kg)   SpO2 96%   BMI 34.39 kg/m  .  BMI Body mass index is 34.39 kg/m.  Wt  Readings from Last 3 Encounters:  03/21/20 290 lb (131.5 kg)  12/06/19 (!) 300 lb 3.2 oz (136.2 kg)  11/17/19 292 lb (132.5 kg)    General: Alert and in no acute distress. He is of large stature.  Weight is down.   Cardiac: Regular rate and rhythm. Heart tones are distant. No murmurs, rubs, or gallops. No edema.  Respiratory:  Lungs are clear to auscultation bilaterally with normal work of breathing.  GI: Soft and nontender.  MS: No deformity or atrophy. Gait and ROM intact.  Skin: Warm and dry. Color is normal.  Neuro:  Strength and sensation are intact and no gross focal deficits noted.  Psych: Alert, appropriate and with normal affect.   LABORATORY DATA:  EKG:  EKG is not ordered today.    Lab Results  Component Value Date   WBC 6.9 09/21/2019   HGB 15.7 09/21/2019   HCT 46.9 09/21/2019   PLT 236 09/21/2019   GLUCOSE 147 (H) 09/21/2019   CHOL 124 09/21/2019   TRIG 158 (H) 09/21/2019   HDL 37 (L) 09/21/2019   LDLCALC 60 09/21/2019   ALT 56 (H) 09/21/2019   AST 27 09/21/2019   NA 137 09/21/2019   K 4.6 09/21/2019   CL 102 09/21/2019   CREATININE 1.06 09/21/2019   BUN 18 09/21/2019   CO2 21 09/21/2019   TSH 2.800 03/23/2019   HGBA1C 7.7 (H) 09/21/2019     BNP (last 3 results) No results for input(s): BNP in the last 8760 hours.  ProBNP (last 3 results) No results for input(s): PROBNP in the last 8760 hours.   Other Studies Reviewed Today:  MRA IMPRESSION 10/2019: 1. 4.5 cm ascending thoracic aortic aneurysm, previously 4.3. Recommend semi-annual imaging followup by CTA or MRA and referral to cardiothoracic surgery if not already obtained. This recommendation follows 2010 ACCF/AHA/AATS/ACR/ASA/SCA/SCAI/SIR/STS/SVM Guidelines for the Diagnosis and Management of Patients With Thoracic Aortic Disease. Circulation. 2010; 121: [id number] 2. No acute findings.   Electronically Signed   By: Lucrezia Europe M.D.   On: 11/04/2019 10:22   Myoview Study Highlights  10/2015      The left ventricular ejection fraction is mildly decreased (45-54%).  Nuclear stress EF: 53%.  There was no ST segment deviation noted during stress.  Defect 1: There is a small defect of moderate severity present in the basal inferoseptal and mid inferoseptal location.  Defect 2: There is a small defect of mild severity present in the apex location.  This is a low risk study.   Low risk stress nuclear study with mild ischemia in the basal inferoseptal wall and mild ischemia in the apical wall; EF 53 with mild global hypokinesis and mild LVE.    Echo Study Conclusions 07/2015   - Left ventricle: The cavity size was normal. There was moderate   concentric hypertrophy. Systolic function was normal. The   estimated ejection fraction was in the range of 55% to 60%. Wall   motion was normal; there were no regional wall motion   abnormalities. Doppler parameters are consistent with abnormal   left ventricular relaxation (grade 1 diastolic dysfunction). - Aortic valve: Trileaflet; mildly thickened, mildly calcified   leaflets. - Aorta: Ascending aortic diameter: 45 mm (S). - Mitral valve: There was mild regurgitation. - Left atrium: The atrium was mildly dilated. - Right ventricle: The cavity size was mildly dilated. Wall   thickness was normal   Assessment/Plan:  1. HTN - BP at goal - doing well - I have left him on his current reigmen of Toprol and Benicar.   2. Non obstructive CAD - remains on chronic DAPT - has done this for many years and he has wished to continue. No worrisome symptoms.   3. HLD -  on statin - lab today.   4. Thoracic aneurysm - followed now by Dr. Cyndia Bent. Has good BP control.   Current medicines are reviewed with the patient today.  The patient does not have concerns regarding medicines other than what has been noted above.  The following changes have been made:  See above.  Labs/ tests ordered today include:    Orders Placed This  Encounter  Procedures  . Basic metabolic panel  . CBC  . Hepatic function panel  . Lipid panel  . Hemoglobin A1c     Disposition:   FU with Dr. Stanford Breed going forward. He is aware that I am leaving next month. Lab today. Overall felt to be doing well.    Patient is agreeable to this plan and will call if any problems develop in the interim.   SignedTruitt Merle, NP  03/21/2020 10:45 AM  [facility] [address] [place], [place]  [address] Phone: [phone] Fax: [phone]

## 2020-03-21 ENCOUNTER — Encounter: Payer: Self-pay | Admitting: Nurse Practitioner

## 2020-03-21 ENCOUNTER — Ambulatory Visit: Payer: Medicare Other | Admitting: Nurse Practitioner

## 2020-03-21 ENCOUNTER — Other Ambulatory Visit: Payer: Self-pay

## 2020-03-21 VITALS — BP 130/80 | HR 79 | Ht 77.0 in | Wt 290.0 lb

## 2020-03-21 DIAGNOSIS — R7989 Other specified abnormal findings of blood chemistry: Secondary | ICD-10-CM

## 2020-03-21 DIAGNOSIS — I2510 Atherosclerotic heart disease of native coronary artery without angina pectoris: Secondary | ICD-10-CM | POA: Diagnosis not present

## 2020-03-21 DIAGNOSIS — I712 Thoracic aortic aneurysm, without rupture, unspecified: Secondary | ICD-10-CM

## 2020-03-21 DIAGNOSIS — I10 Essential (primary) hypertension: Secondary | ICD-10-CM

## 2020-03-21 DIAGNOSIS — E785 Hyperlipidemia, unspecified: Secondary | ICD-10-CM

## 2020-03-21 DIAGNOSIS — R7309 Other abnormal glucose: Secondary | ICD-10-CM

## 2020-03-21 NOTE — Patient Instructions (Addendum)
After Visit Summary:  We will be checking the following labs today - BMET, CBC, HPF, Lipids and A1C   Medication Instructions:    Continue with your current medicines.    If you need a refill on your cardiac medications before your next appointment, please call your pharmacy.     Testing/Procedures To Be Arranged:  N/A  Follow-Up:   See Dr. Kirk Ruths in June    At [facility], you and your health needs are our priority.  As part of our continuing mission to provide you with exceptional heart care, we have created designated Provider Care Teams.  These Care Teams include your primary Cardiologist (physician) and Advanced Practice Providers (APPs -  Physician Assistants and Nurse Practitioners) who all work together to provide you with the care you need, when you need it.  Special Instructions:  . Stay safe, wash your hands for at least 20 seconds and wear a mask when needed.  . It was good to talk with you today.    Call the [place] office at [phone] if you have any questions, problems or concerns.

## 2020-03-22 LAB — BASIC METABOLIC PANEL
BUN/Creatinine Ratio: 18 (ref 10–24)
BUN: 18 mg/dL (ref 8–27)
CO2: 20 mmol/L (ref 20–29)
Calcium: 9.9 mg/dL (ref 8.6–10.2)
Chloride: 103 mmol/L (ref 96–106)
Creatinine, Ser: 0.98 mg/dL (ref 0.76–1.27)
GFR calc Af Amer: 90 mL/min/{1.73_m2} (ref >59)
GFR calc non Af Amer: 78 mL/min/{1.73_m2} (ref >59)
Glucose: 139 mg/dL — ABNORMAL HIGH (ref 65–99)
Potassium: 4.6 mmol/L (ref 3.5–5.2)
Sodium: 139 mmol/L (ref 134–144)

## 2020-03-22 LAB — CBC
Hematocrit: 49 % (ref 37.5–51.0)
Hemoglobin: 16.5 g/dL (ref 13.0–17.7)
MCH: 27.6 pg (ref 26.6–33.0)
MCHC: 33.7 g/dL (ref 31.5–35.7)
MCV: 82 fL (ref 79–97)
Platelets: 209 10*3/uL (ref 150–450)
RBC: 5.98 x10E6/uL — ABNORMAL HIGH (ref 4.14–5.80)
RDW: 13.2 % (ref 11.6–15.4)
WBC: 6.6 10*3/uL (ref 3.4–10.8)

## 2020-03-22 LAB — HEPATIC FUNCTION PANEL
ALT: 46 IU/L — ABNORMAL HIGH (ref 0–44)
AST: 25 IU/L (ref 0–40)
Albumin: 4.7 g/dL (ref 3.8–4.8)
Alkaline Phosphatase: 90 IU/L (ref 44–121)
Bilirubin Total: 0.7 mg/dL (ref 0.0–1.2)
Bilirubin, Direct: 0.21 mg/dL (ref 0.00–0.40)
Total Protein: 7.4 g/dL (ref 6.0–8.5)

## 2020-03-22 LAB — HEMOGLOBIN A1C
Est. average glucose Bld gHb Est-mCnc: 169 mg/dL
Hgb A1c MFr Bld: 7.5 % — ABNORMAL HIGH (ref 4.8–5.6)

## 2020-03-22 LAB — LIPID PANEL
Chol/HDL Ratio: 3.1 ratio (ref 0.0–5.0)
Cholesterol, Total: 120 mg/dL (ref 100–199)
HDL: 39 mg/dL — ABNORMAL LOW (ref >39)
LDL Chol Calc (NIH): 57 mg/dL (ref 0–99)
Triglycerides: 138 mg/dL (ref 0–149)
VLDL Cholesterol Cal: 24 mg/dL (ref 5–40)

## 2020-05-24 ENCOUNTER — Encounter: Payer: Self-pay | Admitting: Gastroenterology

## 2020-06-01 ENCOUNTER — Other Ambulatory Visit: Payer: Self-pay

## 2020-06-01 ENCOUNTER — Ambulatory Visit: Payer: Medicare Other | Admitting: Gastroenterology

## 2020-06-01 ENCOUNTER — Encounter: Payer: Self-pay | Admitting: Gastroenterology

## 2020-06-01 ENCOUNTER — Telehealth: Payer: Self-pay

## 2020-06-01 DIAGNOSIS — Z7902 Long term (current) use of antithrombotics/antiplatelets: Secondary | ICD-10-CM | POA: Insufficient documentation

## 2020-06-01 DIAGNOSIS — Z86010 Personal history of colonic polyps: Secondary | ICD-10-CM

## 2020-06-01 MED ORDER — SUPREP BOWEL PREP KIT 17.5-3.13-1.6 GM/177ML PO SOLN: 1.0000 | ORAL | 0 refills | Status: DC

## 2020-06-01 NOTE — Telephone Encounter (Signed)
   Primary Cardiologist: Nelva Bush, MD  Chart reviewed as part of pre-operative protocol coverage. Patient was contacted 06/01/2020 in reference to pre-operative risk assessment for pending surgery as outlined below.  James Rodgers was last seen on 03/21/2020 by Truitt Merle NP.  Since that day, James Rodgers has done well without any chest pain or shortness of breath.  He has a history of nonobstructive CAD noted on previous cath in 2003.  He was doing well during the last visit back in January.  He has not had any recurrent chest discomfort or shortness of breath.  He is okay to proceed with colonoscopy procedure after holding Plavix for 5 days.  We do defer the earliest restarting time of the Plavix to the surgeon who does the procedure based on the bleeding risk.  Therefore, based on ACC/AHA guidelines, the patient would be at acceptable risk for the planned procedure without further cardiovascular testing.   The patient was advised that if he develops new symptoms prior to surgery to contact our office to arrange for a follow-up visit, and he verbalized understanding.  I will route this recommendation to the requesting party via Epic fax function and remove from pre-op pool. Please call with questions.  [place], [place] 06/01/2020, 1:12 PM

## 2020-06-01 NOTE — Progress Notes (Signed)
Noted  

## 2020-06-01 NOTE — Progress Notes (Signed)
06/01/2020 James Rodgers [id number] 12-04-1950   HISTORY OF PRESENT ILLNESS: This is a pleasant 70 year old male who is a remote patient of Dr. Blanch Media.  He has not been seen here since 2011 at the time of his last colonoscopy when he was found to have 4 polyps that were removed that were both tubular adenomas and hyperplastic polyps.  He also had diverticulosis and internal hemorrhoids.  He says that he takes Metamucil every evening and that helps him to move his bowels well.  He recently saw a little bit of bright red blood on the toilet paper after a bowel movement upon wiping.  That resolved without any issues.  He believes it is probably secondary to his known history of hemorrhoids.  He denies any abdominal pain.  He is on Plavix for history of coronary artery disease.  He follows with Dr. Montey Hora, NP.   Past Medical History:  Diagnosis Date  . Adenomatous colon polyp   . Anal fissure   . Arthritis   . Basal cell carcinoma    neck  . CAD (coronary artery disease)    a. 2003 Cath: LAD 50, D1 50-60, RCA 40;  b. 12/2008 MV: no ischemia/infarct, EF 63%. // c. MV 8/17: mild inf-sept and apical ischemia, EF 53%, Low Risk  . DDD (degenerative disc disease), lumbar   . Diabetes type 2, controlled ([place])   . GERD (gastroesophageal reflux disease)   . Hemorrhoids   . History of dizziness   . History of nuclear stress test    a. Myoview 8/17: EF 53%, mild inferoseptal and mild apical ischemia, low risk  . HLD (hyperlipidemia)   . HTN (hypertension)   . LBP (low back pain)    a. h/o surgery in 1991.  . Obesity   . Orthostatic lightheadedness    with heat exposure  . Osteoarthritis    knees  . Perineal abscess 09/2012  . Pneumonia 09/2005  . PONV (postoperative nausea and vomiting)   . Renal cyst, right   . Retinopathy    Mild, left eye  . Right ureteral stone   . Seasonal allergic rhinitis   . Sleep apnea    a. on cpap.  [name] Spondylosis    lumbar  . Thoracic  aortic aneurysm ([place])    a. MRA 5/17 ascending thoracic aorta measuring 40 mm - recommend annual follow-up // b.  Echo 5/17: Moderate concentric LVH, EF 55-60%, normal wall motion, grade 1 diastolic dysfunction, ascending aortic diameter 45 mm, mild MR, mild LAE // c. Chest MRA 6/18: stable ascending aortic aneurysm measuring 4.1 cm (4.0 cm in 5/17). FU 1 year.   Past Surgical History:  Procedure Laterality Date  . anal fissure surgery  1981  . CARDIAC CATHETERIZATION  2004   1 vessel CAD  . COLONOSCOPY  7829,5621  . KNEE ARTHROSCOPY Left   . LASIK    . [place]   x2  . NASAL SINUS SURGERY  2001  . NASAL SINUS SURGERY  2002  . TOTAL KNEE ARTHROPLASTY Left 09/12/2017   Procedure: LEFT TOTAL KNEE ARTHROPLASTY;  Surgeon: Mcarthur Rossetti, MD;  Location: [facility];  Service: Orthopedics;  Laterality: Left;    reports that he has never smoked. He has never used smokeless tobacco. He reports that he does not drink alcohol and does not use drugs. family history includes Cancer in his paternal aunt; Diabetes in his paternal grandmother, sister, and another family member; Heart  disease in his father; Hypertension in his father; Lung cancer in his paternal uncle; Uterine cancer in his mother. Allergies  Allergen Reactions  . Cipro [Ciprofloxacin Hcl]     Due to thoracic anuerysm  . Codeine Other (See Comments)    dizzy      Outpatient Encounter Medications as of 06/01/2020  Medication Sig  . atorvastatin (LIPITOR) 80 MG tablet TAKE ONE TABLET BY MOUTH DAILY  . clopidogrel (PLAVIX) 75 MG tablet Take 75 mg by mouth daily.  [name] docusate sodium (COLACE) 100 MG capsule Take 100 mg by mouth at bedtime.  [name] esomeprazole (NEXIUM) 40 MG capsule Take 40 mg by mouth daily.  [name] ezetimibe (ZETIA) 10 MG tablet Take 10 mg by mouth daily.  [name] FARXIGA 10 MG TABS tablet Take 10 mg by mouth daily.   . fexofenadine (ALLEGRA) 180 MG tablet Take 180 mg by mouth daily as needed (for allergies).  .  FIBER PO Take 1 capsule by mouth 2 (two) times daily.  . fluticasone (FLONASE) 50 MCG/ACT nasal spray Place 2 sprays into both nostrils daily as needed for allergies or rhinitis.  . metFORMIN (GLUCOPHAGE-XR) 500 MG 24 hr tablet Take 500 mg by mouth 2 (two) times daily.   . metoprolol succinate (TOPROL-XL) 25 MG 24 hr tablet Take 1 tablet (25 mg total) by mouth daily. Take with or immediately following a meal.  . olmesartan (BENICAR) 40 MG tablet TAKE ONE TABLET BY MOUTH DAILY  . Semaglutide (RYBELSUS) 14 MG TABS 1 tablet at least 30 minutes before first food, beverage or other oral medicine of the day  . tamsulosin (FLOMAX) 0.4 MG CAPS capsule Take 0.4 mg by mouth at bedtime.   . [DISCONTINUED] aspirin EC 81 MG tablet Take 81 mg by mouth at bedtime.  . [DISCONTINUED] fluorouracil (EFUDEX) 5 % cream Apply topically 2 (two) times daily. 2 weeks only - pre-skin cancer on face   No facility-administered encounter medications on file as of 06/01/2020.     REVIEW OF SYSTEMS  : All other systems reviewed and negative except where noted in the History of Present Illness.   PHYSICAL EXAM: BP 100/70 (BP Location: Left Arm, Patient Position: Sitting, Cuff Size: Normal)   Pulse 80   Ht 6' 3.75" (1.924 m) Comment: height measured without shoes  Wt 273 lb 2 oz (123.9 kg)   BMI 33.47 kg/m  General: Well developed white male in no acute distress Head: Normocephalic and atraumatic Eyes:  Sclerae anicteric, conjunctiva pink. Ears: Normal auditory acuity Lungs: Clear throughout to auscultation; no W/R/R. Heart: Regular rate and rhythm; no M/R/G. Abdomen: Soft, non-distended.  BS present.  Non-tender. Rectal:  Will be done at the time of colonoscopy. Musculoskeletal: Symmetrical with no gross deformities  Skin: No lesions on visible extremities Extremities: No edema  Neurological: Alert oriented x 4, grossly non-focal Psychological:  Alert and cooperative. Normal mood and affect  ASSESSMENT AND  PLAN: *Personal history of colon polyp: History of tubular adenomas, last colonoscopy 2011.  We will plan for colonoscopy with Dr. Henrene Pastor. *Chronic antiplatelet use with Plavix due to history of coronary artery disease:  Hold Plavix for 5 days before procedure - will instruct when and how to resume after procedure. Risks and benefits of procedure including bleeding, perforation, infection, missed lesions, medication reactions and possible hospitalization or surgery if complications occur explained. Additional rare but real risk of cardiovascular event such as heart attack or ischemia/infarct of other organs off of Plavix explained and need to seek  urgent help if this occurs. Will communicate by phone or EMR with patient's prescribing provider, Dr. Marlou Porch to confirm that holding Plavix is reasonable in this case.   CC:  Leanna Battles, MD

## 2020-06-01 NOTE — Telephone Encounter (Signed)
[facility] Pre-operative Risk Assessment     Request for surgical clearance:     Endoscopy Procedure  What type of surgery is being performed?     Colonoscopy  When is this surgery scheduled?     08-16-2020  What type of clearance is required ?   Pharmacy  Are there any medications that need to be held prior to surgery and how long? Plavix x 5 days  Practice name and name of physician performing surgery?      [facility]  What is your office phone and fax number?      Phone- [phone]  [phone]  Anesthesia type (None, local, MAC, general) ?       MAC

## 2020-06-01 NOTE — Patient Instructions (Addendum)
If you are age 71 or older, your body mass index should be between 23-30. Your Body mass index is 33.47 kg/m. If this is out of the aforementioned range listed, please consider follow up with your Primary Care Provider.  You have been scheduled for a colonoscopy. Please follow written instructions given to you at your visit today.  Please pick up your prep supplies at the pharmacy within the next 1-3 days. If you use inhalers (even only as needed), please bring them with you on the day of your procedure.  Due to recent changes in healthcare laws, you may see the results of your imaging and laboratory studies on MyChart before your provider has had a chance to review them.  We understand that in some cases there may be results that are confusing or concerning to you. Not all laboratory results come back in the same time frame and the provider may be waiting for multiple results in order to interpret others.  Please give [place] 48 hours in order for your provider to thoroughly review all the results before contacting the office for clarification of your results.   Thank you for entrusting me with your care and choosing [facility].  Alonza Bogus, PA

## 2020-06-02 NOTE — Telephone Encounter (Signed)
Patient's wife James Rodgers advised that the patient has been given clearance to hold Plavix 5 days prior to colonoscopy scheduled for 08-16-2020.  Patient advised to take last dose of Plavix on 08-10-2020, and he will be advised when to restart Plavix by Dr Henrene Pastor after the procedure.  Patient agreed to plan and verbalized understanding.  No further questions.

## 2020-06-12 ENCOUNTER — Other Ambulatory Visit: Payer: Self-pay

## 2020-06-12 MED ORDER — ATORVASTATIN CALCIUM 80 MG PO TABS: 1.0000 | ORAL_TABLET | Freq: Every day | ORAL | 1 refills | Status: DC

## 2020-06-12 NOTE — Telephone Encounter (Signed)
Pt has an upcoming appt on 08/15/20 with Dr. Stanford Breed. Would he like to refill pt's medication until the appointment time? Please address

## 2020-08-01 NOTE — Progress Notes (Signed)
HPI: Follow-up coronary artery disease, thoracic aortic aneurysm, hypertension and hyperlipidemia.  Previously followed by [facility].  Cardiac catheterization in 2003 revealed a 50% LAD, 50 to 60% first diagonal and 40% right coronary artery.  Most recent echocardiogram May 2017 showed normal LV function, grade 1 diastolic dysfunction, dilated ascending aorta at 45 mm, mild mitral regurgitation, mild left atrial enlargement and mild right ventricular enlargement.  Nuclear study August 2017 showed ejection fraction 53% with mild ischemia in the basal inferoseptal wall and apex; study felt to be low risk and therefore treated medically.  MRA August 2021 showed 4.5 cm ascending thoracic aortic aneurysm.  Patient was seen by Dr. Cyndia Bent and follow-up MRI recommended 1 year.  Also with history of orthostasis. Since last seen he has lost 40 to 50 pounds.  His blood pressure has since been running low.  Occasionally his systolic is in the high 35K low 90s and he feels dizzy.  He denies dyspnea, exertional chest pain or syncope.  Current Outpatient Medications  Medication Sig Dispense Refill  . atorvastatin (LIPITOR) 80 MG tablet Take 1 tablet (80 mg total) by mouth daily. 90 tablet 1  . clopidogrel (PLAVIX) 75 MG tablet Take 75 mg by mouth daily.    [name] docusate sodium (COLACE) 100 MG capsule Take 100 mg by mouth at bedtime.    [name] esomeprazole (NEXIUM) 40 MG capsule Take 40 mg by mouth daily.    [name] ezetimibe (ZETIA) 10 MG tablet Take 10 mg by mouth daily.    [name] FARXIGA 10 MG TABS tablet Take 10 mg by mouth daily.     . fexofenadine (ALLEGRA) 180 MG tablet Take 180 mg by mouth daily as needed (for allergies).    . FIBER PO Take 1 capsule by mouth 2 (two) times daily.    . fluticasone (FLONASE) 50 MCG/ACT nasal spray Place 2 sprays into both nostrils daily as needed for allergies or rhinitis.    . metFORMIN (GLUCOPHAGE-XR) 500 MG 24 hr tablet Take 500 mg by mouth 2 (two) times daily.     . metoprolol  succinate (TOPROL-XL) 25 MG 24 hr tablet Take 1 tablet (25 mg total) by mouth daily. Take with or immediately following a meal. 90 tablet 3  . olmesartan (BENICAR) 40 MG tablet TAKE ONE TABLET BY MOUTH DAILY 90 tablet 3  . Semaglutide (RYBELSUS) 14 MG TABS 1 tablet at least 30 minutes before first food, beverage or other oral medicine of the day    . tamsulosin (FLOMAX) 0.4 MG CAPS capsule Take 0.4 mg by mouth at bedtime.     . Na Sulfate-K Sulfate-Mg Sulf (SUPREP BOWEL PREP KIT) 17.5-3.13-1.6 GM/177ML SOLN Take 1 kit by mouth as directed. (Patient not taking: Reported on 08/15/2020) 324 mL 0   No current facility-administered medications for this visit.     Past Medical History:  Diagnosis Date  . Adenomatous colon polyp   . Anal fissure   . Arthritis   . Basal cell carcinoma    neck  . CAD (coronary artery disease)    a. 2003 Cath: LAD 50, D1 50-60, RCA 40;  b. 12/2008 MV: no ischemia/infarct, EF 63%. // c. MV 8/17: mild inf-sept and apical ischemia, EF 53%, Low Risk  . DDD (degenerative disc disease), lumbar   . Diabetes type 2, controlled ([place])   . GERD (gastroesophageal reflux disease)   . Hemorrhoids   . History of dizziness   . History of nuclear stress test  a. Myoview 8/17: EF 53%, mild inferoseptal and mild apical ischemia, low risk  . HLD (hyperlipidemia)   . HTN (hypertension)   . LBP (low back pain)    a. h/o surgery in 1991.  . Obesity   . Orthostatic lightheadedness    with heat exposure  . Osteoarthritis    knees  . Perineal abscess 09/2012  . Pneumonia 09/2005  . PONV (postoperative nausea and vomiting)   . Renal cyst, right   . Retinopathy    Mild, left eye  . Right ureteral stone   . Seasonal allergic rhinitis   . Sleep apnea    a. on cpap.  [name] Spondylosis    lumbar  . Thoracic aortic aneurysm ([place])    a. MRA 5/17 ascending thoracic aorta measuring 40 mm - recommend annual follow-up // b.  Echo 5/17: Moderate concentric LVH, EF 55-60%, normal wall  motion, grade 1 diastolic dysfunction, ascending aortic diameter 45 mm, mild MR, mild LAE // c. Chest MRA 6/18: stable ascending aortic aneurysm measuring 4.1 cm (4.0 cm in 5/17). FU 1 year.    Past Surgical History:  Procedure Laterality Date  . anal fissure surgery  1981  . CARDIAC CATHETERIZATION  2004   1 vessel CAD  . COLONOSCOPY  4132,4401  . KNEE ARTHROSCOPY Left   . LASIK    . [place]   x2  . NASAL SINUS SURGERY  2001  . NASAL SINUS SURGERY  2002  . TOTAL KNEE ARTHROPLASTY Left 09/12/2017   Procedure: LEFT TOTAL KNEE ARTHROPLASTY;  Surgeon: Mcarthur Rossetti, MD;  Location: [facility];  Service: Orthopedics;  Laterality: Left;    Social History   Socioeconomic History  . Marital status: Married    Spouse name: Not on file  . Number of children: 2  . Years of education: Not on file  . Highest education level: Not on file  Occupational History  . Occupation: retired IT trainer  Tobacco Use  . Smoking status: Never Smoker  . Smokeless tobacco: Never Used  Vaping Use  . Vaping Use: Never used  Substance and Sexual Activity  . Alcohol use: No  . Drug use: No  . Sexual activity: Not on file  Other Topics Concern  . Not on file  Social History Narrative   Married; retired Agricultural consultant; daily caffeine use.  Lives with wife in [place]. Goes to the gym about 2 times a week.   Social Determinants of Health   Financial Resource Strain: Not on file  Food Insecurity: Not on file  Transportation Needs: Not on file  Physical Activity: Not on file  Stress: Not on file  Social Connections: Not on file  Intimate Partner Violence: Not on file    Family History  Problem Relation Age of Onset  . Heart disease Father   . Hypertension Father   . Uterine cancer Mother        ?  . Diabetes Other        GM  . Diabetes Paternal Grandmother   . Diabetes Sister   . Lung cancer Paternal Uncle   . Cancer Paternal Aunt        type unknown    ROS: no fevers or  chills, productive cough, hemoptysis, dysphasia, odynophagia, melena, hematochezia, dysuria, hematuria, rash, seizure activity, orthopnea, PND, pedal edema, claudication. Remaining systems are negative.  Physical Exam: Well-developed well-nourished in no acute distress.  Skin is warm and dry.  HEENT is normal.  Neck  is supple.  Chest is clear to auscultation with normal expansion.  Cardiovascular exam is regular rate and rhythm.  Abdominal exam nontender or distended. No masses palpated. Extremities show no edema. neuro grossly intact  ECG-normal sinus rhythm with first-degree AV block, no ST changes.  Personally reviewed  A/P  1 thoracic aortic aneurysm-patient will need follow-up MRA August 2022.  2 coronary artery disease-history of nonobstructive disease. Continue Plavix and statin.  3 hyperlipidemia-continue statin.  We will have most recent lipids and liver forwarded to [place] from primary care.  4 hypertension-blood pressure has been running low at home.  He has been symptomatic with dizziness.  Discontinue Toprol and decrease Benicar to 20 mg daily.  Follow blood pressure and adjust medications as needed.  Kirk Ruths, MD

## 2020-08-15 ENCOUNTER — Other Ambulatory Visit: Payer: Self-pay

## 2020-08-15 ENCOUNTER — Encounter: Payer: Self-pay | Admitting: Cardiology

## 2020-08-15 ENCOUNTER — Ambulatory Visit: Payer: Medicare Other | Admitting: Cardiology

## 2020-08-15 VITALS — BP 118/72 | HR 70 | Ht 77.0 in | Wt 256.8 lb

## 2020-08-15 DIAGNOSIS — I2510 Atherosclerotic heart disease of native coronary artery without angina pectoris: Secondary | ICD-10-CM

## 2020-08-15 DIAGNOSIS — E785 Hyperlipidemia, unspecified: Secondary | ICD-10-CM

## 2020-08-15 DIAGNOSIS — I712 Thoracic aortic aneurysm, without rupture, unspecified: Secondary | ICD-10-CM

## 2020-08-15 DIAGNOSIS — I10 Essential (primary) hypertension: Secondary | ICD-10-CM | POA: Diagnosis not present

## 2020-08-15 MED ORDER — OLMESARTAN MEDOXOMIL 20 MG PO TABS
20.0000 mg | ORAL_TABLET | Freq: Every day | ORAL | 3 refills | Status: DC
Start: 2020-08-15 — End: 2021-08-08

## 2020-08-15 NOTE — Patient Instructions (Signed)
Medication Instructions:   STOP METOPROLOL  DECREASE OLMASARTAN TO 20 MG ONCE DAILY= 1/2 OF THE 40 MG TABLET ONCE DAILY  *If you need a refill on your cardiac medications before your next appointment, please call your pharmacy*   Follow-Up: At [facility], you and your health needs are our priority.  As part of our continuing mission to provide you with exceptional heart care, we have created designated Provider Care Teams.  These Care Teams include your primary Cardiologist (physician) and Advanced Practice Providers (APPs -  Physician Assistants and Nurse Practitioners) who all work together to provide you with the care you need, when you need it.  We recommend signing up for the patient portal called "MyChart".  Sign up information is provided on this After Visit Summary.  MyChart is used to connect with patients for Virtual Visits (Telemedicine).  Patients are able to view lab/test results, encounter notes, upcoming appointments, etc.  Non-urgent messages can be sent to your provider as well.   To learn more about what you can do with MyChart, go to [email or web address].    Your next appointment:   6 month(s)  The format for your next appointment:   In Person  Provider:   Kirk Ruths, MD

## 2020-08-16 ENCOUNTER — Encounter: Payer: Self-pay | Admitting: Internal Medicine

## 2020-08-16 ENCOUNTER — Ambulatory Visit (AMBULATORY_SURGERY_CENTER): Payer: Medicare Other | Admitting: Internal Medicine

## 2020-08-16 VITALS — BP 130/76 | HR 72 | Temp 98.6°F | Resp 13 | Ht 75.75 in | Wt 273.0 lb

## 2020-08-16 DIAGNOSIS — D125 Benign neoplasm of sigmoid colon: Secondary | ICD-10-CM

## 2020-08-16 DIAGNOSIS — D122 Benign neoplasm of ascending colon: Secondary | ICD-10-CM

## 2020-08-16 DIAGNOSIS — D123 Benign neoplasm of transverse colon: Secondary | ICD-10-CM

## 2020-08-16 DIAGNOSIS — Z86010 Personal history of colonic polyps: Secondary | ICD-10-CM

## 2020-08-16 DIAGNOSIS — D120 Benign neoplasm of cecum: Secondary | ICD-10-CM

## 2020-08-16 DIAGNOSIS — D124 Benign neoplasm of descending colon: Secondary | ICD-10-CM

## 2020-08-16 MED ORDER — SODIUM CHLORIDE 0.9 % IV SOLN: 500.0000 mL | Freq: Once | INTRAVENOUS | Status: DC

## 2020-08-16 NOTE — Patient Instructions (Addendum)
Ok to resume Plavix today per Dr Henrene Pastor  Handout on polyps given to you today   Await pathology results on polyps removed    YOU HAD AN ENDOSCOPIC PROCEDURE TODAY AT [facility]:   Refer to the procedure report that was given to you for any specific questions about what was found during the examination.  If the procedure report does not answer your questions, please call your gastroenterologist to clarify.  If you requested that your care partner not be given the details of your procedure findings, then the procedure report has been included in a sealed envelope for you to review at your convenience later.  YOU SHOULD EXPECT: Some feelings of bloating in the abdomen. Passage of more gas than usual.  Walking can help get rid of the air that was put into your GI tract during the procedure and reduce the bloating. If you had a lower endoscopy (such as a colonoscopy or flexible sigmoidoscopy) you may notice spotting of blood in your stool or on the toilet paper. If you underwent a bowel prep for your procedure, you may not have a normal bowel movement for a few days.  Please Note:  You might notice some irritation and congestion in your nose or some drainage.  This is from the oxygen used during your procedure.  There is no need for concern and it should clear up in a day or so.  SYMPTOMS TO REPORT IMMEDIATELY:   Following lower endoscopy (colonoscopy or flexible sigmoidoscopy):  Excessive amounts of blood in the stool  Significant tenderness or worsening of abdominal pains  Swelling of the abdomen that is new, acute  Fever of 100F or higher    For urgent or emergent issues, a gastroenterologist can be reached at any hour by calling [phone]. Do not use MyChart messaging for urgent concerns.    DIET:  We do recommend a small meal at first, but then you may proceed to your regular diet.  Drink plenty of fluids but you should avoid alcoholic beverages for 24  hours.  ACTIVITY:  You should plan to take it easy for the rest of today and you should NOT DRIVE or use heavy machinery until tomorrow (because of the sedation medicines used during the test).    FOLLOW UP: Our staff will call the number listed on your records 48-72 hours following your procedure to check on you and address any questions or concerns that you may have regarding the information given to you following your procedure. If we do not reach you, we will leave a message.  We will attempt to reach you two times.  During this call, we will ask if you have developed any symptoms of COVID 19. If you develop any symptoms (ie: fever, flu-like symptoms, shortness of breath, cough etc.) before then, please call [phone].  If you test positive for Covid 19 in the 2 weeks post procedure, please call and report this information to [place].    If any biopsies were taken you will be contacted by phone or by letter within the next 1-3 weeks.  Please call us at [phone] if you have not heard about the biopsies in 3 weeks.    SIGNATURES/CONFIDENTIALITY: You and/or your care partner have signed paperwork which will be entered into your electronic medical record.  These signatures attest to the fact that that the information above on your After Visit Summary has been reviewed and is understood.  Full responsibility of the  confidentiality of this discharge information lies with you and/or your care-partner. 

## 2020-08-16 NOTE — Op Note (Signed)
[facility] Patient Name: James Rodgers Procedure Date: 08/16/2020 10:33 AM MRN: [id number] Endoscopist: Docia Chuck. Henrene Pastor , MD Age: 70 Referring MD:  Date of Birth: 1950-06-11 Gender: Male Account #: [id number] Procedure:                Colonoscopy with cold snare polypectomy x 13 Indications:              High risk colon cancer surveillance: Personal                            history of multiple (3 or more) adenomas. Previous                            examination 2011. Overdue for follow-up Medicines:                Monitored Anesthesia Care Procedure:                Pre-Anesthesia Assessment:                           - Prior to the procedure, a History and Physical                            was performed, and patient medications and                            allergies were reviewed. The patient's tolerance of                            previous anesthesia was also reviewed. The risks                            and benefits of the procedure and the sedation                            options and risks were discussed with the patient.                            All questions were answered, and informed consent                            was obtained. Prior Anticoagulants: The patient has                            taken no previous anticoagulant or antiplatelet                            agents. ASA Grade Assessment: II - A patient with                            mild systemic disease. After reviewing the risks                            and benefits, the patient was deemed in  satisfactory condition to undergo the procedure.                           After obtaining informed consent, the colonoscope                            was passed under direct vision. Throughout the                            procedure, the patient's blood pressure, pulse, and                            oxygen saturations were monitored continuously. The                             Olympus CF-[id number]) Colonoscope was                            introduced through the anus and advanced to the the                            cecum, identified by appendiceal orifice and                            ileocecal valve. The ileocecal valve, appendiceal                            orifice, and rectum were photographed. The quality                            of the bowel preparation was excellent. The                            colonoscopy was performed without difficulty. The                            patient tolerated the procedure well. The bowel                            preparation used was SUPREP via split dose                            instruction. Scope In: 10:40:27 AM Scope Out: 11:09:13 AM Scope Withdrawal Time: 0 hours 24 minutes 1 second  Total Procedure Duration: 0 hours 28 minutes 46 seconds  Findings:                 Thirteen polyps were found in the sigmoid colon,                            descending colon, transverse colon, ascending colon                            and cecum. The polyps were 2 to 5 mm in size. These  polyps were removed with a cold snare. Resection                            and retrieval were complete.                           Multiple diverticula were found in the left colon                            and right colon.                           Internal hemorrhoids were found during retroflexion.                           The exam was otherwise without abnormality on                            direct and retroflexion views. Complications:            No immediate complications. Estimated blood loss:                            None. Estimated Blood Loss:     Estimated blood loss: none. Impression:               - Ten 2 to 5 mm polyps in the sigmoid colon, in the                            descending colon, in the transverse colon, in the                            ascending colon and in the cecum,  removed with a                            cold snare. Resected and retrieved.                           - Diverticulosis in the left colon and in the right                            colon.                           - Internal hemorrhoids.                           - The examination was otherwise normal on direct                            and retroflexion views. Recommendation:           - Repeat colonoscopy in 1 year for surveillance if                            10 or more adenomatous polyps; otherwise 3 years.                           -  Patient has a contact number available for                            emergencies. The signs and symptoms of potential                            delayed complications were discussed with the                            patient. Return to normal activities tomorrow.                            Written discharge instructions were provided to the                            patient.                           - Resume previous diet.                           - Continue present medications.                           - Await pathology results. Docia Chuck. Henrene Pastor, MD 08/16/2020 11:19:13 AM This report has been signed electronically.

## 2020-08-16 NOTE — Progress Notes (Signed)
A and O x3. Report to RN. Tolerated MAC anesthesia well.

## 2020-08-16 NOTE — Progress Notes (Signed)
Called to room to assist during endoscopic procedure.  Patient ID and intended procedure confirmed with present staff. Received instructions for my participation in the procedure from the performing physician.  

## 2020-08-16 NOTE — Progress Notes (Signed)
VS- James Rodgers 

## 2020-08-18 ENCOUNTER — Encounter: Payer: Self-pay | Admitting: Internal Medicine

## 2020-08-18 ENCOUNTER — Telehealth: Payer: Self-pay

## 2020-08-18 ENCOUNTER — Telehealth: Payer: Self-pay | Admitting: *Deleted

## 2020-08-18 NOTE — Telephone Encounter (Signed)
Left message on follow up call. 

## 2020-08-18 NOTE — Telephone Encounter (Signed)
  Follow up Call-  Call back number 08/16/2020  Post procedure Call Back phone  # [phone]  Permission to leave phone message Yes  Some recent data might be hidden     Patient questions:  Do you have a fever, pain , or abdominal swelling? No. Pain Score  0 *  Have you tolerated food without any problems? Yes.    Have you been able to return to your normal activities? Yes.    Do you have any questions about your discharge instructions: Diet   No. Medications  No. Follow up visit  No.  Do you have questions or concerns about your Care? No.  Actions: * If pain score is 4 or above: No action needed, pain <4. Have you developed a fever since your procedure? no  2.   Have you had an respiratory symptoms (SOB or cough) since your procedure? no  3.   Have you tested positive for COVID 19 since your procedure no  4.   Have you had any family members/close contacts diagnosed with the COVID 19 since your procedure?  no   If yes to any of these questions please route to Joylene John, RN and Joella Prince, RN

## 2020-08-30 ENCOUNTER — Ambulatory Visit: Payer: Medicare Other | Admitting: Cardiology

## 2020-09-18 ENCOUNTER — Encounter: Payer: Self-pay | Admitting: *Deleted

## 2020-09-20 ENCOUNTER — Telehealth: Payer: Self-pay | Admitting: Internal Medicine

## 2020-09-20 NOTE — Telephone Encounter (Signed)
Discussed path letter with pt and he is aware of results.

## 2020-09-20 NOTE — Telephone Encounter (Signed)
Patient calling wants to know/discuss colon results.. Plz advise.. Thank you

## 2020-10-19 ENCOUNTER — Other Ambulatory Visit: Payer: Self-pay | Admitting: Surgery

## 2020-10-19 DIAGNOSIS — I712 Thoracic aortic aneurysm, without rupture, unspecified: Secondary | ICD-10-CM

## 2020-10-23 ENCOUNTER — Ambulatory Visit
Admission: RE | Admit: 2020-10-23 | Discharge: 2020-10-23 | Disposition: A | Discharge status: Home / Self Care | Payer: Medicare Other | Source: Ambulatory Visit | Attending: Surgery | Admitting: Surgery

## 2020-10-23 ENCOUNTER — Other Ambulatory Visit: Payer: Self-pay

## 2020-10-23 DIAGNOSIS — I712 Thoracic aortic aneurysm, without rupture, unspecified: Secondary | ICD-10-CM

## 2020-10-23 MED ORDER — GADOBENATE DIMEGLUMINE 529 MG/ML IV SOLN
13.0000 mL | Freq: Once | INTRAVENOUS | Status: AC | PRN
  Administered 2020-10-23: 13 mL via INTRAVENOUS

## 2020-11-01 ENCOUNTER — Ambulatory Visit: Payer: Medicare Other | Admitting: Internal Medicine

## 2020-11-01 ENCOUNTER — Encounter: Payer: Self-pay | Admitting: Internal Medicine

## 2020-11-01 VITALS — BP 112/70 | HR 91 | Ht 77.0 in | Wt 255.0 lb

## 2020-11-01 DIAGNOSIS — K649 Unspecified hemorrhoids: Secondary | ICD-10-CM | POA: Diagnosis not present

## 2020-11-01 DIAGNOSIS — Z86010 Personal history of colonic polyps: Secondary | ICD-10-CM

## 2020-11-01 DIAGNOSIS — Z7902 Long term (current) use of antithrombotics/antiplatelets: Secondary | ICD-10-CM | POA: Diagnosis not present

## 2020-11-01 NOTE — Patient Instructions (Signed)
If you are age 71 or older, your body mass index should be between 23-30. Your Body mass index is 30.24 kg/m. If this is out of the aforementioned range listed, please consider follow up with your Primary Care Provider.  If you are age 90 or younger, your body mass index should be between 19-25. Your Body mass index is 30.24 kg/m. If this is out of the aformentioned range listed, please consider follow up with your Primary Care Provider.   __________________________________________________________  The [facility] providers would like to encourage you to use [facility] to communicate with providers for non-urgent requests or questions.  Due to long hold times on the telephone, sending your provider a message by [facility] may be a faster and more efficient way to get a response.  Please allow 48 business hours for a response.  Please remember that this is for non-urgent requests.   You are scheduled for a hemorrhoid banding with Dr. Hilarie Fredrickson on 12/28/20 at 3:40am.  Increase your Metamucil to 2 tablespoons in 16 ounces of water daily

## 2020-11-01 NOTE — Progress Notes (Signed)
HISTORY OF PRESENT ILLNESS:  James Rodgers is a 70 y.o. male with multiple medical problems including coronary artery disease for which he is on Plavix.  Patient does have a history of multiple adenomatous colon polyps.  His last colonoscopy was performed August 16, 2020.  He was found to have 13 polyps.  Follow-up in 1 year recommended.  He presents today for hemorrhoidal discomfort and possible hemorrhoidal banding procedure.  He describes rectal fullness and pain which he attributed to hemorrhoids.  His bowels do tend to be constipated.  No other complaints.  His recent colonoscopy did not demonstrate internal hemorrhoids.  REVIEW OF SYSTEMS:  All non-GI ROS negative unless otherwise stated in the HPI.  Past Medical History:  Diagnosis Date   Adenomatous colon polyp    Anal fissure    Arthritis    Basal cell carcinoma    neck   CAD (coronary artery disease)    a. 2003 Cath: LAD 50, D1 50-60, RCA 40;  b. 12/2008 MV: no ischemia/infarct, EF 63%. // c. MV 8/17: mild inf-sept and apical ischemia, EF 53%, Low Risk   DDD (degenerative disc disease), lumbar    Diabetes type 2, controlled ([facility])    GERD (gastroesophageal reflux disease)    Hemorrhoids    History of dizziness    History of nuclear stress test    a. Myoview 8/17: EF 53%, mild inferoseptal and mild apical ischemia, low risk   HLD (hyperlipidemia)    HTN (hypertension)    Internal hemorrhoids    LBP (low back pain)    a. h/o surgery in 1991.   Obesity    Orthostatic lightheadedness    with heat exposure   Osteoarthritis    knees   Perineal abscess 09/2012   Pneumonia 09/2005   PONV (postoperative nausea and vomiting)    Renal cyst, right    Retinopathy    Mild, left eye   Right ureteral stone    Seasonal allergic rhinitis    Sleep apnea    a. on cpap.   Spondylosis    lumbar   Thoracic aortic aneurysm ([place])    a. MRA 5/17 ascending thoracic aorta measuring 40 mm - recommend annual follow-up // b.  Echo 5/17:  Moderate concentric LVH, EF 55-60%, normal wall motion, grade 1 diastolic dysfunction, ascending aortic diameter 45 mm, mild MR, mild LAE // c. Chest MRA 6/18: stable ascending aortic aneurysm measuring 4.1 cm (4.0 cm in 5/17). FU 1 year.    Past Surgical History:  Procedure Laterality Date   anal fissure surgery  1981   CARDIAC CATHETERIZATION  2004   1 vessel CAD   COLONOSCOPY  2003,2011   KNEE ARTHROSCOPY Left    LASIK     LUMBAR SPINE SURGERY  1990   x2   NASAL SINUS SURGERY  2001   NASAL SINUS SURGERY  2002   TOTAL KNEE ARTHROPLASTY Left 09/12/2017   Procedure: LEFT TOTAL KNEE ARTHROPLASTY;  Surgeon: Mcarthur Rossetti, MD;  Location: [facility];  Service: Orthopedics;  Laterality: Left;    Social History James Rodgers  reports that he has never smoked. He has never used smokeless tobacco. He reports that he does not drink alcohol and does not use drugs.  family history includes Cancer in his paternal aunt; Diabetes in his paternal grandmother, sister, and another family member; Heart disease in his father; Hypertension in his father; Lung cancer in his paternal uncle; Uterine cancer in his mother.  Allergies  Allergen Reactions   Cipro [Ciprofloxacin Hcl]     Due to thoracic anuerysm   Codeine Other (See Comments)    dizzy       PHYSICAL EXAMINATION: Vital signs: BP 112/70   Pulse 91   Ht '6\' 5"'$  (1.956 m)   Wt 255 lb (115.7 kg)   BMI 30.24 kg/m   Constitutional: generally well-appearing, no acute distress Psychiatric: alert and oriented x3, cooperative Eyes: Anicteric Mouth: Mask Abdomen: Not reexamined Rectal: 2 small external hemorrhoidal tags.  Palpable internal hemorrhoids Skin: no obvious lesions on visible extremities Neuro: No gross deficits  ASSESSMENT:  1.  Symptomatic internal hemorrhoids 2.  History of multiple adenomatous colon polyps 3.  Multiple medical problems including coronary artery disease on Plavix   PLAN:  1.  Refer to Dr.  Hilarie Rodgers for an office hemorrhoidal banding.  I reviewed the procedure with the patient.  The patient is higher than baseline risk.  I discussed with him complications.  Generally performed without interrupting antiplatelet therapy.  Provided with educational brochure on the specific procedure. 2.  Increase fiber and water consumption 3.  Surveillance colonoscopy 1 year

## 2020-11-22 NOTE — Progress Notes (Signed)
Chief Complaint  Patient presents with   Thoracic Aortic Aneurysm    Surgical consult, MRA Chest 11/04/19     HPI:  The patient is a 70 year old gentleman with history of hypertension, hyperlipidemia, type 2 diabetes, orthostatic hypotension and moderate nonobstructive coronary artery disease who also has an ascending thoracic aortic aneurysm that is being followed with MRA since 2017 when it was measured at 4.0 cm.  He had an MRA of the chest on 11/02/2018 and the ascending aortic aneurysm was measured at 4.1 cm in the mid ascending aorta by radiology.  His follow-up MRA on 11/04/2019 showed the maximum diameter of the ascending aortic aneurysm to measure 4.5 cm.  The radiologist who reviewed this scan also remeasured the aneurysm on the scan and felt that it was 4.3 cm.  The patient denies any family history of thoracic or abdominal aortic aneurysm.  There is no family history of bicuspid aortic valve disease or connective tissue disorder.  There is no family history of aortic dissection.  He was seen in consultation last year by Dr. Cyndia Bent. We are seeing him today for annual surveillance of his ascending aortic aneurysm. He has lost 40-45 lbs since last year.   Past Medical History:  Diagnosis Date   Anal fissure    Arthritis    Basal cell carcinoma    neck   CAD (coronary artery disease)    a. 2003 Cath: LAD 50, D1 50-60, RCA 40;  b. 12/2008 MV: no ischemia/infarct, EF 63%. // c. MV 8/17: mild inf-sept and apical ischemia, EF 53%, Low Risk   DDD (degenerative disc disease), lumbar    Diabetes type 2, controlled (HCC)    GERD (gastroesophageal reflux disease)    Hemorrhoids    History of dizziness    History of nuclear stress test    a. Myoview 8/17: EF 53%, mild inferoseptal and mild apical ischemia, low risk   HLD (hyperlipidemia)    HTN (hypertension)    LBP (low back pain)    a. h/o surgery in 1991.   Obesity    Orthostatic lightheadedness    with heat exposure    Osteoarthritis    knees   Perineal abscess 09/2012   Pneumonia 09/2005   PONV (postoperative nausea and vomiting)    Renal cyst, right    Retinopathy    Mild, left eye   Right ureteral stone    Seasonal allergic rhinitis    Sleep apnea    a. on cpap.   Spondylosis    lumbar   Thoracic aortic aneurysm ([place])    a. MRA 5/17 ascending thoracic aorta measuring 40 mm - recommend annual follow-up // b.  Echo 5/17: Moderate concentric LVH, EF 55-60%, normal wall motion, grade 1 diastolic dysfunction, ascending aortic diameter 45 mm, mild MR, mild LAE // c. Chest MRA 6/18: stable ascending aortic aneurysm measuring 4.1 cm (4.0 cm in 5/17). FU 1 year.    Past Surgical History:  Procedure Laterality Date   anal fissure surgery  1981   CARDIAC CATHETERIZATION  2004   1 vessel CAD   COLONOSCOPY  2003,2011   KNEE ARTHROSCOPY Left    LASIK     LUMBAR SPINE SURGERY  1990   x2   NASAL SINUS SURGERY  2001   NASAL SINUS SURGERY  2002   TOTAL KNEE ARTHROPLASTY Left 09/12/2017   Procedure: LEFT TOTAL KNEE ARTHROPLASTY;  Surgeon: Mcarthur Rossetti, MD;  Location: [facility];  Service: Orthopedics;  Laterality:  Left;    Family History  Problem Relation Age of Onset   Heart disease Father    Diabetes Other        GM    Social History Social History   Tobacco Use   Smoking status: Never Smoker   Smokeless tobacco: Never Used  Scientific laboratory technician Use: Never used  Substance Use Topics   Alcohol use: No   Drug use: No    Current Outpatient Medications  Medication Sig Dispense Refill   aspirin EC 81 MG tablet Take 81 mg by mouth at bedtime.     atorvastatin (LIPITOR) 80 MG tablet TAKE ONE TABLET BY MOUTH DAILY 90 tablet 1   clopidogrel (PLAVIX) 75 MG tablet Take 75 mg by mouth daily.     docusate sodium (COLACE) 100 MG capsule Take 100 mg by mouth at bedtime.     esomeprazole (NEXIUM) 40 MG capsule Take 40 mg by mouth daily.       ezetimibe (ZETIA) 10 MG tablet Take 10 mg by mouth  daily.     FARXIGA 10 MG TABS tablet Take 10 mg by mouth daily.      fexofenadine (ALLEGRA) 180 MG tablet Take 180 mg by mouth daily as needed (for allergies).      FIBER PO Take 1 capsule by mouth 2 (two) times daily.     fluticasone (FLONASE) 50 MCG/ACT nasal spray Place 2 sprays into both nostrils daily as needed for allergies or rhinitis.     metFORMIN (GLUCOPHAGE-XR) 500 MG 24 hr tablet Take 500 mg by mouth 2 (two) times daily.      olmesartan (BENICAR) 40 MG tablet TAKE ONE TABLET BY MOUTH DAILY 90 tablet 3   tamsulosin (FLOMAX) 0.4 MG CAPS capsule Take 0.4 mg by mouth at bedtime.      metoprolol succinate (TOPROL-XL) 25 MG 24 hr tablet Take 1 tablet (25 mg total) by mouth daily. Take with or immediately following a meal. 90 tablet 3   No current facility-administered medications for this visit.    Allergies  Allergen Reactions   Codeine Other (See Comments)    dizzy      Vitals:   11/23/20 1325  BP: 133/80  Pulse: 75  Resp: 20  SpO2: 96%     Physical Exam      Diagnostic Tests:  CLINICAL DATA:  Thoracic aortic aneurysm, follow-up   EXAM: MRA CHEST WITH OR WITHOUT CONTRAST   TECHNIQUE: Angiographic images of the chest were obtained using MRA technique without and with intravenous contrast.   CONTRAST:  74m MULTIHANCE GADOBENATE DIMEGLUMINE 529 MG/ML IV SOLN   COMPARISON:  August 2021   FINDINGS: Cardiovascular: Preferential opacification of the thoracic aorta. The ascending thoracic aorta measures up to 4.2 x 4.1 cm, previously 4.2 cm when measured in the same plane by my measurements. The remainder of the thoracic aorta is normal in caliber. Pulmonary arteries are normal in caliber and patent proximally. The heart is normal in size. No pericardial effusion.   Mediastinum/Nodes: No enlarged mediastinal, hilar, or axillary lymph nodes.   Lungs/Pleura: No pleural effusion. No large mass or lobar consolidation.   Musculoskeletal: No aggressive  osseous lesions. The visualized cervicothoracic spinal cord appears within normal limits.   Upper abdomen: The visualized upper abdomen is unremarkable.   IMPRESSION: Stable appearance of ascending thoracic aortic aneurysm measuring up to 4.2 cm, similar to previous exam. Recommend annual imaging followup by CTA or MRA. This recommendation follows 2010 ACCF/AHA/AATS/ACR/ASA/SCA/SCAI/SIR/STS/SVM Guidelines  for the Diagnosis and Management of Patients with Thoracic Aortic Disease. Circulation. 2010; [id number]. Aortic aneurysm NOS (ICD10-I71.9)     Electronically Signed   By: Albin Felling M.D.   On: 10/23/2020 09:05  Narrative & Impression  CLINICAL DATA:  Thoracic aortic aneurysm, follow-up   EXAM: MRA CHEST WITH OR WITHOUT CONTRAST   TECHNIQUE: Angiographic images of the chest were obtained using MRA technique without and with intravenous contrast.   CONTRAST:  19m GADAVIST GADOBUTROL 1 MMOL/ML IV SOLN   COMPARISON:  11/02/2018   FINDINGS: Cardiovascular:   Heart size normal. No pericardial effusion. Central pulmonary arteries normal in caliber. No aortic dissection or stenosis.   Aortic Root:   --Valve: 2.9 cm   --Sinuses: 4.1 cm   --Sinotubular Junction: 3.3 cm   Limitations by motion: Minimal   Thoracic Aorta:   --Ascending Aorta: 4.5 cm (previously 4.3 by my measurement)   --Aortic Arch: 3.7 cm   --Descending Aorta: 3.1   Classic 3 vessel brachiocephalic arterial origin anatomy without proximal stenosis. No significant atheromatous irregularity.   Mediastinum/Nodes: No mass or adenopathy.   Lungs/Pleura: No pleural effusion.  No pulmonary mass evident.   Upper Abdomen: Negative limited evaluation   Musculoskeletal: No bone lesion identified   Review of the MIP images confirms the above findings.   IMPRESSION: 1. 4.5 cm ascending thoracic aortic aneurysm, previously 4.3. Recommend semi-annual imaging followup by CTA or MRA and referral  to cardiothoracic surgery if not already obtained. This recommendation follows 2010 ACCF/AHA/AATS/ACR/ASA/SCA/SCAI/SIR/STS/SVM Guidelines for the Diagnosis and Management of Patients With Thoracic Aortic Disease. Circulation. 2010; 121: [id number]. No acute findings.     Electronically Signed   By: DLucrezia EuropeM.D.   On: 11/04/2019 10:22    Impression: Stable appearing 4.2 cm ascending thoracic aortic aneurysm. This is well below the surgical threshold of 5.5 cm.    Plan:  I will plan to see him back in 1 year with an MRA of the chest to follow-up on his ascending aortic aneurysm. There is no family history of bicuspid aortic valve disease or connective tissue disorder. We reviewed the importance of hypertension control.   No fluoroquinolone (Levaquin)  Discussed low-sodium diet and increasing his exercise. Him and his wife do golf a few times a week and they do look after their 266and 465year-old grandchildren.   I spent 20 minutes performing this consultation and > 50% of this time was spent face to face counseling and coordinating the care of this patient's ascending aortic aneurysm   TNicholes Rough PA-C [facility] ([phone]

## 2020-11-23 ENCOUNTER — Ambulatory Visit: Payer: Medicare Other | Admitting: Physician Assistant

## 2020-11-23 ENCOUNTER — Other Ambulatory Visit: Payer: Self-pay

## 2020-11-23 VITALS — BP 133/80 | HR 75 | Resp 20 | Ht 77.0 in | Wt 258.0 lb

## 2020-11-23 DIAGNOSIS — I712 Thoracic aortic aneurysm, without rupture, unspecified: Secondary | ICD-10-CM

## 2020-11-23 NOTE — Patient Instructions (Signed)
No fluoroquinolone (Levaquin)

## 2020-12-04 NOTE — Progress Notes (Signed)
PATIENT: James Rodgers DOB: 06-09-1950  REASON FOR VISIT: follow up HISTORY FROM: patient PRIMARY NEUROLOGIST:   Virtual Visit via Video Note  I connected with [name] on 12/05/20 at  2:00 PM EDT by a video enabled telemedicine application located remotely at [facility] and verified that I am speaking with the correct person using two identifiers who was located at their own home.   I discussed the limitations of evaluation and management by telemedicine and the availability of in person appointments. The patient expressed understanding and agreed to proceed.   PATIENT: James Rodgers DOB: 10-20-1950  REASON FOR VISIT: follow up HISTORY FROM: patient  HISTORY OF PRESENT ILLNESS: Today 12/05/20: Mr. James Rodgers is a 70 year old male with a history of obstructive sleep apnea on CPAP.  He reports that the CPAP is working well.  He reports that he is due for new machine but his is working fine therefore he is okay with keeping his current machine.  He returns today for an evaluation.       HISTORY 12/06/19:   Mr. James Rodgers is a 70 year old male with a history of obstructive sleep apnea on CPAP.  His download indicates that he uses machine 29 out of 30 days for compliance of 97%.  He uses machine greater than 4 hours each night.  On average he uses his machine 7 hours and 34 minutes.  His residual AHI is 0.9 on 8 to 15 cm of water with EPR of three.  Leak in the 95th percentile is 12.5 L/min.  Reports that his CPAP is working well for him.  He also has a travel machine that he uses.  He returns today for an evaluation.   REVIEW OF SYSTEMS: Out of a complete 14 system review of symptoms, the patient complains only of the following symptoms, and all other reviewed systems are negative.  ALLERGIES: Allergies  Allergen Reactions   Cipro [Ciprofloxacin Hcl]     Due to thoracic anuerysm   Codeine Other (See Comments)    dizzy    HOME  MEDICATIONS: Outpatient Medications Prior to Visit  Medication Sig Dispense Refill   atorvastatin (LIPITOR) 40 MG tablet Take 40 mg by mouth daily.     clopidogrel (PLAVIX) 75 MG tablet Take 75 mg by mouth daily.     docusate sodium (COLACE) 100 MG capsule Take 100 mg by mouth at bedtime.     esomeprazole (NEXIUM) 40 MG capsule Take 40 mg by mouth daily.     FARXIGA 10 MG TABS tablet Take 10 mg by mouth daily.      fexofenadine (ALLEGRA) 180 MG tablet Take 180 mg by mouth daily as needed (for allergies).     FIBER PO Take 1 capsule by mouth 2 (two) times daily.     fluticasone (FLONASE) 50 MCG/ACT nasal spray Place 2 sprays into both nostrils daily as needed for allergies or rhinitis.     metFORMIN (GLUCOPHAGE-XR) 500 MG 24 hr tablet Take 500 mg by mouth 2 (two) times daily.      olmesartan (BENICAR) 20 MG tablet Take 1 tablet (20 mg total) by mouth daily. 90 tablet 3   Semaglutide (OZEMPIC, 2 MG/DOSE, [place]) Inject into the skin once a week.     tamsulosin (FLOMAX) 0.4 MG CAPS capsule Take 0.4 mg by mouth at bedtime.      No facility-administered medications prior to visit.    PAST MEDICAL HISTORY: Past Medical History:  Diagnosis Date  Adenomatous colon polyp    Anal fissure    Arthritis    Basal cell carcinoma    neck   CAD (coronary artery disease)    a. 2003 Cath: LAD 50, D1 50-60, RCA 40;  b. 12/2008 MV: no ischemia/infarct, EF 63%. // c. MV 8/17: mild inf-sept and apical ischemia, EF 53%, Low Risk   DDD (degenerative disc disease), lumbar    Diabetes type 2, controlled (HCC)    GERD (gastroesophageal reflux disease)    Hemorrhoids    History of dizziness    History of nuclear stress test    a. Myoview 8/17: EF 53%, mild inferoseptal and mild apical ischemia, low risk   HLD (hyperlipidemia)    HTN (hypertension)    Internal hemorrhoids    LBP (low back pain)    a. h/o surgery in 1991.   Obesity    Orthostatic lightheadedness    with heat exposure   Osteoarthritis     knees   Perineal abscess 09/2012   Pneumonia 09/2005   PONV (postoperative nausea and vomiting)    Renal cyst, right    Retinopathy    Mild, left eye   Right ureteral stone    Seasonal allergic rhinitis    Sleep apnea    a. on cpap.   Spondylosis    lumbar   Thoracic aortic aneurysm ([place])    a. MRA 5/17 ascending thoracic aorta measuring 40 mm - recommend annual follow-up // b.  Echo 5/17: Moderate concentric LVH, EF 55-60%, normal wall motion, grade 1 diastolic dysfunction, ascending aortic diameter 45 mm, mild MR, mild LAE // c. Chest MRA 6/18: stable ascending aortic aneurysm measuring 4.1 cm (4.0 cm in 5/17). FU 1 year.    PAST SURGICAL HISTORY: Past Surgical History:  Procedure Laterality Date   anal fissure surgery  1981   CARDIAC CATHETERIZATION  2004   1 vessel CAD   COLONOSCOPY  2003,2011   KNEE ARTHROSCOPY Left    LASIK     LUMBAR SPINE SURGERY  1990   x2   NASAL SINUS SURGERY  2001   NASAL SINUS SURGERY  2002   TOTAL KNEE ARTHROPLASTY Left 09/12/2017   Procedure: LEFT TOTAL KNEE ARTHROPLASTY;  Surgeon: Mcarthur Rossetti, MD;  Location: [facility];  Service: Orthopedics;  Laterality: Left;    FAMILY HISTORY: Family History  Problem Relation Age of Onset   Heart disease Father    Hypertension Father    Uterine cancer Mother        ?   Diabetes Other        GM   Diabetes Paternal Grandmother    Diabetes Sister    Lung cancer Paternal Uncle    Cancer Paternal Aunt        type unknown   Colon cancer Neg Hx    Esophageal cancer Neg Hx    Stomach cancer Neg Hx    Rectal cancer Neg Hx     SOCIAL HISTORY: Social History   Socioeconomic History   Marital status: Married    Spouse name: Not on file   Number of children: 2   Years of education: Not on file   Highest education level: Not on file  Occupational History   Occupation: retired IT trainer  Tobacco Use   Smoking status: Never   Smokeless tobacco: Never  Vaping Use   Vaping Use: Never used   Substance and Sexual Activity   Alcohol use: No   Drug use: No  Sexual activity: Not on file  Other Topics Concern   Not on file  Social History Narrative   Married; retired Agricultural consultant; daily caffeine use.  Lives with wife in [place]. Goes to the gym about 2 times a week.   Social Determinants of Health   Financial Resource Strain: Not on file  Food Insecurity: Not on file  Transportation Needs: Not on file  Physical Activity: Not on file  Stress: Not on file  Social Connections: Not on file  Intimate Partner Violence: Not on file      PHYSICAL EXAM Generalized: Well developed, in no acute distress   Neurological examination  Mentation: Alert oriented to time, place, history taking.  speech and language fluent   DIAGNOSTIC DATA (LABS, IMAGING, TESTING) - I reviewed patient records, labs, notes, testing and imaging myself where available.  Lab Results  Component Value Date   WBC 6.6 03/21/2020   HGB 16.5 03/21/2020   HCT 49.0 03/21/2020   MCV 82 03/21/2020   PLT 209 03/21/2020      Component Value Date/Time   NA 139 03/21/2020 1051   K 4.6 03/21/2020 1051   CL 103 03/21/2020 1051   CO2 20 03/21/2020 1051   GLUCOSE 139 (H) 03/21/2020 1051   GLUCOSE 168 (H) 09/13/2017 0505   BUN 18 03/21/2020 1051   CREATININE 0.98 03/21/2020 1051   CALCIUM 9.9 03/21/2020 1051   PROT 7.4 03/21/2020 1051   ALBUMIN 4.7 03/21/2020 1051   AST 25 03/21/2020 1051   ALT 46 (H) 03/21/2020 1051   ALKPHOS 90 03/21/2020 1051   BILITOT 0.7 03/21/2020 1051   GFRNONAA 78 03/21/2020 1051   GFRAA 90 03/21/2020 1051   Lab Results  Component Value Date   CHOL 120 03/21/2020   HDL 39 (L) 03/21/2020   LDLCALC 57 03/21/2020   TRIG 138 03/21/2020   CHOLHDL 3.1 03/21/2020   Lab Results  Component Value Date   HGBA1C 7.5 (H) 03/21/2020   No results found for: [id number] Lab Results  Component Value Date   TSH 2.800 03/23/2019      ASSESSMENT AND PLAN 70 y.o. year old male  has a past  medical history of Adenomatous colon polyp, Anal fissure, Arthritis, Basal cell carcinoma, CAD (coronary artery disease), DDD (degenerative disc disease), lumbar, Diabetes type 2, controlled ([place]), GERD (gastroesophageal reflux disease), Hemorrhoids, History of dizziness, History of nuclear stress test, HLD (hyperlipidemia), HTN (hypertension), Internal hemorrhoids, LBP (low back pain), Obesity, Orthostatic lightheadedness, Osteoarthritis, Perineal abscess (09/2012), Pneumonia (09/2005), PONV (postoperative nausea and vomiting), Renal cyst, right, Retinopathy, Right ureteral stone, Seasonal allergic rhinitis, Sleep apnea, Spondylosis, and Thoracic aortic aneurysm ([place]). here with:  OSA on CPAP  CPAP compliance excellent Residual AHI is good Encouraged patient to continue using CPAP nightly and > 4 hours each night F/U in 1 year or sooner if needed    Ward Givens, MSN, NP-C 12/05/2020, 1:45 PM [facility] [address] [place], [place] [address] [phone]

## 2020-12-05 ENCOUNTER — Telehealth (INDEPENDENT_AMBULATORY_CARE_PROVIDER_SITE_OTHER): Payer: Medicare Other | Admitting: Adult Health

## 2020-12-05 DIAGNOSIS — G4733 Obstructive sleep apnea (adult) (pediatric): Secondary | ICD-10-CM

## 2020-12-05 DIAGNOSIS — Z9989 Dependence on other enabling machines and devices: Secondary | ICD-10-CM | POA: Diagnosis not present

## 2020-12-18 ENCOUNTER — Other Ambulatory Visit: Payer: Self-pay | Admitting: Internal Medicine

## 2020-12-28 ENCOUNTER — Encounter: Payer: Self-pay | Admitting: Internal Medicine

## 2020-12-28 ENCOUNTER — Ambulatory Visit: Payer: Medicare Other | Admitting: Internal Medicine

## 2020-12-28 VITALS — BP 160/80 | HR 75 | Ht 77.0 in | Wt 254.0 lb

## 2020-12-28 DIAGNOSIS — K648 Other hemorrhoids: Secondary | ICD-10-CM

## 2020-12-28 NOTE — Patient Instructions (Addendum)
If you are age 71 or older, your body mass index should be between 23-30. Your Body mass index is 30.12 kg/m. If this is out of the aforementioned range listed, please consider follow up with your Primary Care Provider. ________________________________________________________  The [facility] providers would like to encourage you to use [facility] to communicate with providers for non-urgent requests or questions.  Due to long hold times on the telephone, sending your provider a message by [facility] may be a faster and more efficient way to get a response.  Please allow 48 business hours for a response.  Please remember that this is for non-urgent requests.   HEMORRHOID BANDING PROCEDURE    FOLLOW-UP CARE   The procedure you have had should have been relatively painless since the banding of the area involved does not have nerve endings and there is no pain sensation.  The rubber band cuts off the blood supply to the hemorrhoid and the band may fall off as soon as 48 hours after the banding (the band may occasionally be seen in the toilet bowl following a bowel movement). You may notice a temporary feeling of fullness in the rectum which should respond adequately to plain Tylenol or Motrin.  Following the banding, avoid strenuous exercise that evening and resume full activity the next day.  A sitz bath (soaking in a warm tub) or bidet is soothing, and can be useful for cleansing the area after bowel movements.     To avoid constipation, take two tablespoons of natural wheat bran, natural oat bran, flax, Benefiber or any over the counter fiber supplement and increase your water intake to 7-8 glasses daily.    Unless you have been prescribed anorectal medication, do not put anything inside your rectum for two weeks: No suppositories, enemas, fingers, etc.  Occasionally, you may have more bleeding than usual after the banding procedure.  This is often from the untreated hemorrhoids rather than the  treated one.  Don't be concerned if there is a tablespoon or so of blood.  If there is more blood than this, lie flat with your bottom higher than your head and apply an ice pack to the area. If the bleeding does not stop within a half an hour or if you feel faint, call our office at [phone] or go to the emergency room.  Problems are not common; however, if there is a substantial amount of bleeding, severe pain, chills, fever or difficulty passing urine (very rare) or other problems, you should call us at [phone] or report to the nearest emergency room.  Do not stay seated continuously for more than 2-3 hours for a day or two after the procedure.  Tighten your buttock muscles 10-15 times every two hours and take 10-15 deep breaths every 1-2 hours.  Do not spend more than a few minutes on the toilet if you cannot empty your bowel; instead re-visit the toilet at a later time.   You have been scheduled for your second Hemorrhoid banding on February 08, 2021 at 11:00 am.

## 2020-12-29 NOTE — Progress Notes (Signed)
James Rodgers is a 70 year old male patient of Dr. Henrene Pastor who presents for evaluation of symptomatic and intermittent hemorrhoids. He also has a history of multiple adenomatous colon polyps. History includes sleep apnea, hyperlipidemia, hypertension, CAD on Plavix.  He reports intermittent hemorrhoidal irritation which includes prolapse, intermittent anal itching and burning.  He did a couple of months ago have what sounds like a thrombosed external hemorrhoid which was painful with sitting.  This lasted a few days and then calm down.  He did have 1 day of bright red blood small volume which resolved. Regular bowel movements. No pain with defecation.  He has had hemorrhoidectomy in the past which was outpatient but painful for recovery.   PROCEDURE NOTE:  The patient presents with symptomatic grade 2 internal hemorrhoids, requesting rubber band ligation of his hemorrhoidal disease.  All risks, benefits and alternative forms of therapy were described and informed consent was obtained.  It should be noted that he is on Plavix therapy.  We discussed the risk of post banding bleeding which is 1% or less without antiplatelet or anticoagulant therapy.  Uninterrupted Plavix therapy does raise the risk of post banding hemorrhage.  We discussed however that post banding hemorrhage can occur up to several weeks after banding and thus interrupting Plavix for that long also carries risk from a cardiovascular perspective.  After discussing the slightly elevated risk of post banding bleeding in the setting of Plavix he is agreeable and wishes to proceed.   The anorectum was pre-medicated with 0.125% nitroglycerin ointment The decision was made to band the LL internal hemorrhoid, and the [place] was used to perform band ligation without complication.   Digital anorectal examination was then performed to assure proper positioning of the band, and to adjust the banded tissue as required.  The  patient was discharged home without pain or other issues.  Dietary and behavioral recommendations were given and along with follow-up instructions.      The patient will return as scheduled for follow-up and possible additional banding as required.  Based on rectal exam today would recommend banding right posterior hemorrhoid next.  No significant internal hemorrhoidal tissue found in the right anterior position.  No complications were encountered and the patient tolerated the procedure well.

## 2021-02-08 ENCOUNTER — Ambulatory Visit: Payer: Medicare Other | Admitting: Internal Medicine

## 2021-02-08 ENCOUNTER — Encounter: Payer: Self-pay | Admitting: Internal Medicine

## 2021-02-08 VITALS — BP 120/82 | HR 84 | Ht 77.0 in | Wt 259.0 lb

## 2021-02-08 DIAGNOSIS — K648 Other hemorrhoids: Secondary | ICD-10-CM

## 2021-02-08 NOTE — Patient Instructions (Signed)
You have been scheduled for an appointment with Dr Hilarie Fredrickson on 04/04/21 at 3:40 pm.  If you are age 71 or older, your body mass index should be between 23-30. Your Body mass index is 30.71 kg/m. If this is out of the aforementioned range listed, please consider follow up with your Primary Care Provider.  If you are age 55 or younger, your body mass index should be between 19-25. Your Body mass index is 30.71 kg/m. If this is out of the aformentioned range listed, please consider follow up with your Primary Care Provider.   ________________________________________________________  The [facility] providers would like to encourage you to use [facility] to communicate with providers for non-urgent requests or questions.  Due to long hold times on the telephone, sending your provider a message by [facility] may be a faster and more efficient way to get a response.  Please allow 48 business hours for a response.  Please remember that this is for non-urgent requests.  _______________________________________________________ Due to recent changes in healthcare laws, you may see the results of your imaging and laboratory studies on MyChart before your provider has had a chance to review them.  We understand that in some cases there may be results that are confusing or concerning to you. Not all laboratory results come back in the same time frame and the provider may be waiting for multiple results in order to interpret others.  Please give [place] 48 hours in order for your provider to thoroughly review all the results before contacting the office for clarification of your results.

## 2021-02-08 NOTE — Progress Notes (Signed)
Mr. Bulman returns for additional hemorrhoidal banding. 70 year old patient of Dr. Henrene Pastor with symptomatic internal and external hemorrhoids.  Symptoms prior to banding included: Prolapse, intermittent anal itching and burning.  Issues intermittently with thrombosed external hemorrhoids.  Tolerated the first banding very well.  He feels that overall symptoms have already improved to some degree.  He does remain on Plavix.  We discussed the risk of post banding bleeding being higher than average in patients on anticoagulation and antiplatelet therapy.  He understands this risk and wishes to proceed.  He will monitor for rectal bleeding and let me know if this occurs post banding.   PROCEDURE NOTE:  The patient presents with symptomatic grade 2 internal hemorrhoids, requesting rubber band ligation of his hemorrhoidal disease.  All risks, benefits and alternative forms of therapy were described and informed consent was obtained.   The anorectum was pre-medicated with 0.125% nitroglycerin ointment The decision was made to band the RP (LL banded initially) internal hemorrhoid, and the [place] was used to perform band ligation without complication.   Digital anorectal examination was then performed to assure proper positioning of the band, and to adjust the banded tissue as required.  The patient was discharged home without pain or other issues.  Dietary and behavioral recommendations were given and along with follow-up instructions.      The patient will return as scheduled for follow-up and possible additional banding as required. No complications were encountered and the patient tolerated the procedure well.

## 2021-02-08 NOTE — Progress Notes (Signed)
[id number] coronary artery disease, thoracic aortic aneurysm, hypertension and hyperlipidemia. Cardiac catheterization in 2003 revealed a 50% LAD, 50 to 60% first diagonal and 40% right coronary artery.  Most recent echocardiogram May 2017 showed normal LV function, grade 1 diastolic dysfunction, dilated ascending aorta at 45 mm, mild mitral regurgitation, mild left atrial enlargement and mild right ventricular enlargement.  Nuclear study August 2017 showed ejection fraction 53% with mild ischemia in the basal inferoseptal wall and apex; study felt to be low risk and therefore treated medically.  Also with history of orthostasis.  MRA August 2022 showed stable appearance of a sending thoracic aortic aneurysm measuring 4.2 cm; followed by Dr Cyndia Bent. Since last seen there is no dyspnea, chest pain, palpitations or syncope.  Current Outpatient Medications  Medication Sig Dispense Refill   atorvastatin (LIPITOR) 40 MG tablet Take 40 mg by mouth daily.     clopidogrel (PLAVIX) 75 MG tablet Take 75 mg by mouth daily.     docusate sodium (COLACE) 100 MG capsule Take 100 mg by mouth at bedtime.     esomeprazole (NEXIUM) 40 MG capsule Take 40 mg by mouth daily.     FIBER PO Take 1 capsule by mouth 2 (two) times daily.     metFORMIN (GLUCOPHAGE-XR) 500 MG 24 hr tablet Take 500 mg by mouth 2 (two) times daily.      olmesartan (BENICAR) 20 MG tablet Take 1 tablet (20 mg total) by mouth daily. 90 tablet 3   Semaglutide (OZEMPIC, 2 MG/DOSE, [place]) Inject 1 mg into the skin once a week.     tamsulosin (FLOMAX) 0.4 MG CAPS capsule Take 0.4 mg by mouth at bedtime.      FARXIGA 10 MG TABS tablet Take 10 mg by mouth daily.  (Patient not taking: Reported on 02/14/2021)     fexofenadine (ALLEGRA) 180 MG tablet Take 180 mg by mouth daily as needed (for allergies). (Patient not taking: Reported on 02/14/2021)     fluticasone (FLONASE) 50 MCG/ACT nasal spray Place 2 sprays into both nostrils daily as needed for  allergies or rhinitis. (Patient not taking: Reported on 02/14/2021)     nitroGLYCERIN (NITROLINGUAL) 0.4 MG/SPRAY spray take 1 dose by mouth daily as needed for chest pain (Patient not taking: Reported on 02/14/2021)     No current facility-administered medications for this visit.     Past Medical History:  Diagnosis Date   Adenomatous colon polyp    Anal fissure    Arthritis    Basal cell carcinoma    neck   CAD (coronary artery disease)    a. 2003 Cath: LAD 50, D1 50-60, RCA 40;  b. 12/2008 MV: no ischemia/infarct, EF 63%. // c. MV 8/17: mild inf-sept and apical ischemia, EF 53%, Low Risk   DDD (degenerative disc disease), lumbar    Diabetes type 2, controlled (HCC)    GERD (gastroesophageal reflux disease)    Hemorrhoids    History of dizziness    History of nuclear stress test    a. Myoview 8/17: EF 53%, mild inferoseptal and mild apical ischemia, low risk   HLD (hyperlipidemia)    HTN (hypertension)    Internal hemorrhoids    LBP (low back pain)    a. h/o surgery in 1991.   Obesity    Orthostatic lightheadedness    with heat exposure   Osteoarthritis    knees   Perineal abscess 09/2012   Pneumonia 09/2005   PONV (postoperative nausea and vomiting)  Renal cyst, right    Retinopathy    Mild, left eye   Right ureteral stone    Seasonal allergic rhinitis    Sleep apnea    a. on cpap.   Spondylosis    lumbar   Thoracic aortic aneurysm    a. MRA 5/17 ascending thoracic aorta measuring 40 mm - recommend annual follow-up // b.  Echo 5/17: Moderate concentric LVH, EF 55-60%, normal wall motion, grade 1 diastolic dysfunction, ascending aortic diameter 45 mm, mild MR, mild LAE // c. Chest MRA 6/18: stable ascending aortic aneurysm measuring 4.1 cm (4.0 cm in 5/17). FU 1 year.    Past Surgical History:  Procedure Laterality Date   anal fissure surgery  1981   CARDIAC CATHETERIZATION  2004   1 vessel CAD   COLONOSCOPY  2003,2011   KNEE ARTHROSCOPY Left    LASIK      LUMBAR SPINE SURGERY  1990   x2   NASAL SINUS SURGERY  2001   NASAL SINUS SURGERY  2002   TOTAL KNEE ARTHROPLASTY Left 09/12/2017   Procedure: LEFT TOTAL KNEE ARTHROPLASTY;  Surgeon: Mcarthur Rossetti, MD;  Location: [facility];  Service: Orthopedics;  Laterality: Left;    Social History   Socioeconomic History   Marital status: Married    Spouse name: Not on file   Number of children: 2   Years of education: Not on file   Highest education level: Not on file  Occupational History   Occupation: retired IT trainer  Tobacco Use   Smoking status: Never   Smokeless tobacco: Never  Vaping Use   Vaping Use: Never used  Substance and Sexual Activity   Alcohol use: No   Drug use: No   Sexual activity: Not on file  Other Topics Concern   Not on file  Social History Narrative   Married; retired Agricultural consultant; daily caffeine use.  Lives with wife in [place]. Goes to the gym about 2 times a week.   Social Determinants of Health   Financial Resource Strain: Not on file  Food Insecurity: Not on file  Transportation Needs: Not on file  Physical Activity: Not on file  Stress: Not on file  Social Connections: Not on file  Intimate Partner Violence: Not on file    Family History  Problem Relation Age of Onset   Heart disease Father    Hypertension Father    Uterine cancer Mother        ?   Diabetes Other        GM   Diabetes Paternal Grandmother    Diabetes Sister    Lung cancer Paternal Uncle    Cancer Paternal Aunt        type unknown   Colon cancer Neg Hx    Esophageal cancer Neg Hx    Stomach cancer Neg Hx    Rectal cancer Neg Hx     ROS: no fevers or chills, productive cough, hemoptysis, dysphasia, odynophagia, melena, hematochezia, dysuria, hematuria, rash, seizure activity, orthopnea, PND, pedal edema, claudication. Remaining systems are negative.  Physical Exam: Well-developed well-nourished in no acute distress.  Skin is warm and dry.  HEENT is normal.  Neck is  supple.  Chest is clear to auscultation with normal expansion.  Cardiovascular exam is regular rate and rhythm.  Abdominal exam nontender or distended. No masses palpated. Extremities show no edema. neuro grossly intact  Electrocardiogram today shows sinus rhythm at a rate of 78 and no ST changes; personally reviewed.  A/P  1 thoracic aortic aneurysm-stable on most recent MRA.  Will need follow-up study August 2023.  2 coronary artery disease-he denies chest pain.  Nonobstructive at time of previous catheterization.  Continue Plavix and statin.  3 hypertension-blood pressure elevated; however he states typically controlled.  Continue present medical regimen and follow.  4 hyperlipidemia-continue statin.  Kirk Ruths, MD

## 2021-02-14 ENCOUNTER — Other Ambulatory Visit: Payer: Self-pay

## 2021-02-14 ENCOUNTER — Ambulatory Visit: Payer: Medicare Other | Admitting: Cardiology

## 2021-02-14 ENCOUNTER — Encounter: Payer: Self-pay | Admitting: Cardiology

## 2021-02-14 VITALS — BP 160/86 | HR 93 | Ht 77.0 in | Wt 258.6 lb

## 2021-02-14 DIAGNOSIS — I712 Thoracic aortic aneurysm, without rupture, unspecified: Secondary | ICD-10-CM | POA: Diagnosis not present

## 2021-02-14 DIAGNOSIS — I10 Essential (primary) hypertension: Secondary | ICD-10-CM | POA: Diagnosis not present

## 2021-02-14 DIAGNOSIS — I2510 Atherosclerotic heart disease of native coronary artery without angina pectoris: Secondary | ICD-10-CM | POA: Diagnosis not present

## 2021-02-14 DIAGNOSIS — E785 Hyperlipidemia, unspecified: Secondary | ICD-10-CM | POA: Diagnosis not present

## 2021-02-14 NOTE — Patient Instructions (Signed)
  Follow-Up: At [facility], you and your health needs are our priority.  As part of our continuing mission to provide you with exceptional heart care, we have created designated Provider Care Teams.  These Care Teams include your primary Cardiologist (physician) and Advanced Practice Providers (APPs -  Physician Assistants and Nurse Practitioners) who all work together to provide you with the care you need, when you need it.  We recommend signing up for the patient portal called "MyChart".  Sign up information is provided on this After Visit Summary.  MyChart is used to connect with patients for Virtual Visits (Telemedicine).  Patients are able to view lab/test results, encounter notes, upcoming appointments, etc.  Non-urgent messages can be sent to your provider as well.   To learn more about what you can do with MyChart, go to [email or web address].    Your next appointment:   12 month(s)  The format for your next appointment:   In Person  Provider:   Kirk Ruths MD

## 2021-04-04 ENCOUNTER — Encounter: Payer: Self-pay | Admitting: Internal Medicine

## 2021-04-04 ENCOUNTER — Ambulatory Visit: Payer: Medicare Other | Admitting: Internal Medicine

## 2021-04-04 VITALS — BP 134/78 | HR 68 | Ht 77.0 in | Wt 256.0 lb

## 2021-04-04 DIAGNOSIS — K648 Other hemorrhoids: Secondary | ICD-10-CM

## 2021-04-04 NOTE — Patient Instructions (Signed)

## 2021-04-04 NOTE — Progress Notes (Signed)
Mr. Conwell returns for additional hemorrhoidal banding  Symptoms prior to banding include: Prolapse, intermittent anal itching and burning; also prior thrombosed external hemorrhoids  Tolerated the first banding well.  He feels symptoms have improved.  He remains on Plavix.  We discussed the risk of post banding bleeding being higher than average in patients on anticoagulation or antiplatelet therapy.  He understands this risk and wishes to proceed.   PROCEDURE NOTE:  The patient presents with symptomatic grade w internal hemorrhoids, requesting rubber band ligation of his hemorrhoidal disease.  All risks, benefits and alternative forms of therapy were described and informed consent was obtained.   The anorectum was pre-medicated with 0.125% nitroglycerin ointment The decision was made to band the RA (RP and LL banded previously) internal hemorrhoid, and the [place] was used to perform band ligation without complication.   Digital anorectal examination was then performed to assure proper positioning of the band, and to adjust the banded tissue as required.  The patient was discharged home without pain or other issues.  Dietary and behavioral recommendations were given and along with follow-up instructions.    The patient will return as needed follow-up with Dr. Henrene Pastor. No complications were encountered and the patient tolerated the procedure well.

## 2021-08-07 ENCOUNTER — Other Ambulatory Visit: Payer: Self-pay | Admitting: Cardiology

## 2021-09-27 ENCOUNTER — Encounter: Payer: Self-pay | Admitting: Internal Medicine

## 2021-10-12 ENCOUNTER — Other Ambulatory Visit: Payer: Self-pay | Admitting: Surgery

## 2021-10-12 DIAGNOSIS — I7121 Aneurysm of the ascending aorta, without rupture: Secondary | ICD-10-CM

## 2021-10-19 ENCOUNTER — Ambulatory Visit
Admission: RE | Admit: 2021-10-19 | Discharge: 2021-10-19 | Disposition: A | Discharge status: Home / Self Care | Payer: Medicare Other | Source: Ambulatory Visit | Attending: Surgery | Admitting: Surgery

## 2021-10-19 DIAGNOSIS — I7121 Aneurysm of the ascending aorta, without rupture: Secondary | ICD-10-CM

## 2021-10-19 MED ORDER — GADOBENATE DIMEGLUMINE 529 MG/ML IV SOLN
20.0000 mL | Freq: Once | INTRAVENOUS | Status: AC | PRN
  Administered 2021-10-19: 20 mL via INTRAVENOUS

## 2021-11-27 ENCOUNTER — Ambulatory Visit (INDEPENDENT_AMBULATORY_CARE_PROVIDER_SITE_OTHER): Payer: Self-pay | Admitting: Surgical

## 2021-11-27 VITALS — BP 144/79 | HR 74 | Resp 20 | Ht 77.0 in | Wt 278.0 lb

## 2021-11-27 DIAGNOSIS — I7121 Aneurysm of the ascending aorta, without rupture: Secondary | ICD-10-CM

## 2021-11-27 NOTE — Progress Notes (Signed)
Subjective:     Patient ID: James Rodgers, male    DOB: [birth year], 71 y.o.   MRN: [id number]  Chief complaint: Follow-up for ongoing surveillance of thoracic aortic aneurysm  HPI Patient is in today for follow-up for ongoing surveillance of his 4.2 cm ascending thoracic aneurysm.  His MRA from 10/19/2021 shows very stable measurements.  He denies chest pain.  He reports that he does check his blood pressure and systolic generally ranges in the 120-130 range.  He overall feels pretty well.  He does play golf for exercise.  Echocardiogram done in 2017 shows tricuspid aortic valve.  He reports his type 2 diabetes under good control with most recent hemoglobin A1c of 6.   Past Medical History:  Diagnosis Date   Adenomatous colon polyp    Anal fissure    Arthritis    Basal cell carcinoma    neck   CAD (coronary artery disease)    a. 2003 Cath: LAD 50, D1 50-60, RCA 40;  b. 12/2008 MV: no ischemia/infarct, EF 63%. // c. MV 8/17: mild inf-sept and apical ischemia, EF 53%, Low Risk   DDD (degenerative disc disease), lumbar    Diabetes type 2, controlled (HCC)    GERD (gastroesophageal reflux disease)    Hemorrhoids    History of dizziness    History of nuclear stress test    a. Myoview 8/17: EF 53%, mild inferoseptal and mild apical ischemia, low risk   HLD (hyperlipidemia)    HTN (hypertension)    Internal hemorrhoids    LBP (low back pain)    a. h/o surgery in 1991.   Obesity    Orthostatic lightheadedness    with heat exposure   Osteoarthritis    knees   Perineal abscess 09/2012   Pneumonia 09/2005   PONV (postoperative nausea and vomiting)    Renal cyst, right    Retinopathy    Mild, left eye   Right ureteral stone    Seasonal allergic rhinitis    Sleep apnea    a. on cpap.   Spondylosis    lumbar   Thoracic aortic aneurysm    a. MRA 5/17 ascending thoracic aorta measuring 40 mm - recommend annual follow-up // b.  Echo 5/17: Moderate concentric LVH, EF 55-60%,  normal wall motion, grade 1 diastolic dysfunction, ascending aortic diameter 45 mm, mild MR, mild LAE // c. Chest MRA 6/18: stable ascending aortic aneurysm measuring 4.1 cm (4.0 cm in 5/17). FU 1 year.      Current Outpatient Medications  Medication Instructions   atorvastatin (LIPITOR) 40 mg, Oral, Daily   clopidogrel (PLAVIX) 75 mg, Oral, Daily   docusate sodium (COLACE) 100 mg, Oral, Daily at bedtime   esomeprazole (NEXIUM) 40 mg, Oral, Daily,     Farxiga 10 mg, Daily   fexofenadine (ALLEGRA) 180 mg, Oral, Daily PRN   FIBER PO 1 capsule, Oral, 2 times daily   fluticasone (FLONASE) 50 MCG/ACT nasal spray 2 sprays, Each Nare, Daily PRN   metFORMIN (GLUCOPHAGE-XR) 500 mg, Oral, 2 times daily   nitroGLYCERIN (NITROLINGUAL) 0.4 MG/SPRAY spray    olmesartan (BENICAR) 20 MG tablet TAKE ONE TABLET BY MOUTH DAILY   Semaglutide (OZEMPIC, 2 MG/DOSE, [place]) 1 mg, Subcutaneous, Weekly   tamsulosin (FLOMAX) 0.4 mg, Oral, Daily at bedtime      Objective:    There were no vitals taken for this visit. BP Readings from Last 3 Encounters:  11/27/21 (!) 144/79  04/04/21 134/78  02/14/21 (!) 160/86   Wt Readings from Last 3 Encounters:  11/27/21 278 lb (126.1 kg)  04/04/21 256 lb (116.1 kg)  02/14/21 258 lb 9.6 oz (117.3 kg)      Physical Exam Constitutional:      General: He is not in acute distress.    Appearance: Normal appearance.  HENT:     Head: Normocephalic.  Cardiovascular:     Rate and Rhythm: Normal rate and regular rhythm.     Pulses: Normal pulses.     Heart sounds: No murmur heard. Pulmonary:     Effort: Pulmonary effort is normal.     Breath sounds: Normal breath sounds.  Abdominal:     Palpations: Abdomen is soft.     Tenderness: There is no abdominal tenderness.  Musculoskeletal:     Right lower leg: No edema.     Left lower leg: No edema.  Skin:    General: Skin is warm and dry.  Neurological:     Mental Status: He is alert and oriented to person, place, and  time.  Psychiatric:        Thought Content: Thought content normal.     No results found for any visits on 11/27/21. Narrative & Impression  CLINICAL DATA:  Thoracic aortic aneurysm follow-up.   EXAM: MRA CHEST WITH OR WITHOUT CONTRAST   TECHNIQUE: Angiographic images of the chest were obtained using MRA technique without and with intravenous contrast.   CONTRAST:  70m MULTIHANCE GADOBENATE DIMEGLUMINE 529 MG/ML IV SOLN   COMPARISON:  MRA chest 10/23/2020   FINDINGS: VASCULAR   Aorta: Aortic root measures approximately 4.2 cm and minimally changed. Ascending thoracic aorta measures up to 4.2 cm and stable. No evidence for an aortic dissection. Typical three-vessel arch anatomy and great vessels are patent. Proximal descending thoracic aorta measures 2.7 cm.   Heart: Limited evaluation of the heart.   Pulmonary Arteries:  Main and central pulmonary arteries are patent.   Other: Central veins are unremarkable.   NON-VASCULAR   Visualized mediastinal structures are unremarkable. No large pleural effusions. No gross abnormality in the visualized lungs.   IMPRESSION: 1. Stable fusiform aneurysm of the ascending thoracic aorta measuring up to 4.2 cm. Recommend annual imaging followup by CTA or MRA. This recommendation follows 2010 ACCF/AHA/AATS/ACR/ASA/SCA/SCAI/SIR/STS/SVM Guidelines for the Diagnosis and Management of Patients with Thoracic Aortic Disease. Circulation. 2010; 121:: B151-V616 Aortic aneurysm NOS (ICD10-I71.9)     Electronically Signed   By: AMarkus DaftM.D.   On: 10/19/2021 14:35       Assessment & Plan:   Stable measurements of his fusiform aneurysm of the ascending thoracic aorta measuring up to 4.2 cm.  We will repeat a CTA of the chest in 1 year for ongoing surveillance.  Did discuss ongoing lifestyle and medical management for his chronic illnesses to maximize lowering risk.    Problem List Items Addressed This Visit   None   No orders  of the defined types were placed in this encounter.   No follow-ups on file.  WJohn Giovanni PA-C

## 2021-11-27 NOTE — Patient Instructions (Signed)
Ongoing maximization of lifestyle and medical conditions

## 2021-12-03 ENCOUNTER — Encounter: Payer: Self-pay | Admitting: *Deleted

## 2021-12-03 NOTE — Progress Notes (Unsigned)
PATIENT: James Rodgers DOB: 11-09-1950  REASON FOR VISIT: follow up HISTORY FROM: patient PRIMARY NEUROLOGIST: Dr. Vickey Huger  Virtual Visit via Video Note  I connected with James Rodgers on 12/04/21 at  2:00 PM EDT by a video enabled telemedicine application located remotely at [facility] and verified that I am speaking with the correct person using two identifiers who was located at their own home.   I discussed the limitations of evaluation and management by telemedicine and the availability of in person appointments. The patient expressed understanding and agreed to proceed.   PATIENT: James Rodgers DOB: [birth year]  REASON FOR VISIT: follow up HISTORY FROM: patient  HISTORY OF PRESENT ILLNESS: Today 12/04/21:  James Rodgers is a 71 year old male with a history of obstructive sleep apnea on CPAP.  He returns today for follow-up.  His download is below.  Reports that the CPAP is working well.  He states he has had the CPAP for quite a while and will probably need a new one in the future.  He is not interested in a new machine today.   11/15/20: James Rodgers is a 71 year old male with a history of obstructive sleep apnea on CPAP.  He reports that the CPAP is working well.  He reports that he is due for new machine but his is working fine therefore he is okay with keeping his current machine.  He returns today for an evaluation.       HISTORY 12/06/19:   James Rodgers is a 71 year old male with a history of obstructive sleep apnea on CPAP.  His download indicates that he uses machine 29 out of 30 days for compliance of 97%.  He uses machine greater than 4 hours each night.  On average he uses his machine 7 hours and 34 minutes.  His residual AHI is 0.9 on 8 to 15 cm of water with EPR of three.  Leak in the 95th percentile is 12.5 L/min.  Reports that his CPAP is working well for him.  He also has a travel machine that he uses.  He returns  today for an evaluation.   REVIEW OF SYSTEMS: Out of a complete 14 system review of symptoms, the patient complains only of the following symptoms, and all other reviewed systems are negative.  ALLERGIES: Allergies  Allergen Reactions   Cipro [Ciprofloxacin Hcl]     Due to thoracic anuerysm   Codeine Other (See Comments)    dizzy    HOME MEDICATIONS: Outpatient Medications Prior to Visit  Medication Sig Dispense Refill   atorvastatin (LIPITOR) 40 MG tablet Take 40 mg by mouth daily.     clopidogrel (PLAVIX) 75 MG tablet Take 75 mg by mouth daily.     docusate sodium (COLACE) 100 MG capsule Take 100 mg by mouth at bedtime.     esomeprazole (NEXIUM) 40 MG capsule Take 40 mg by mouth daily.     FARXIGA 10 MG TABS tablet Take 10 mg by mouth daily.     fexofenadine (ALLEGRA) 180 MG tablet Take 180 mg by mouth daily as needed (for allergies).     FIBER PO Take 1 capsule by mouth 2 (two) times daily.     fluticasone (FLONASE) 50 MCG/ACT nasal spray Place 2 sprays into both nostrils daily as needed for allergies or rhinitis.     metFORMIN (GLUCOPHAGE-XR) 500 MG 24 hr tablet Take 500 mg by mouth 2 (two) times daily.      nitroGLYCERIN (NITROLINGUAL)  0.4 MG/SPRAY spray      olmesartan (BENICAR) 20 MG tablet TAKE ONE TABLET BY MOUTH DAILY 90 tablet 3   Semaglutide (OZEMPIC, 2 MG/DOSE, [place]) Inject 1 mg into the skin once a week.     tamsulosin (FLOMAX) 0.4 MG CAPS capsule Take 0.4 mg by mouth at bedtime.      No facility-administered medications prior to visit.    PAST MEDICAL HISTORY: Past Medical History:  Diagnosis Date   Adenomatous colon polyp    Anal fissure    Arthritis    Basal cell carcinoma    neck   CAD (coronary artery disease)    a. 2003 Cath: LAD 50, D1 50-60, RCA 40;  b. 12/2008 MV: no ischemia/infarct, EF 63%. // c. MV 8/17: mild inf-sept and apical ischemia, EF 53%, Low Risk   DDD (degenerative disc disease), lumbar    Diabetes type 2, controlled (HCC)    GERD  (gastroesophageal reflux disease)    Hemorrhoids    History of dizziness    History of nuclear stress test    a. Myoview 8/17: EF 53%, mild inferoseptal and mild apical ischemia, low risk   HLD (hyperlipidemia)    HTN (hypertension)    Internal hemorrhoids    LBP (low back pain)    a. h/o surgery in 1991.   Obesity    Orthostatic lightheadedness    with heat exposure   Osteoarthritis    knees   Perineal abscess 09/2012   Pneumonia 09/2005   PONV (postoperative nausea and vomiting)    Renal cyst, right    Retinopathy    Mild, left eye   Right ureteral stone    Seasonal allergic rhinitis    Sleep apnea    a. on cpap.   Spondylosis    lumbar   Thoracic aortic aneurysm (HCC)    a. MRA 5/17 ascending thoracic aorta measuring 40 mm - recommend annual follow-up // b.  Echo 5/17: Moderate concentric LVH, EF 55-60%, normal wall motion, grade 1 diastolic dysfunction, ascending aortic diameter 45 mm, mild MR, mild LAE // c. Chest MRA 6/18: stable ascending aortic aneurysm measuring 4.1 cm (4.0 cm in 5/17). FU 1 year.    PAST SURGICAL HISTORY: Past Surgical History:  Procedure Laterality Date   anal fissure surgery  1981   CARDIAC CATHETERIZATION  2004   1 vessel CAD   COLONOSCOPY  2003,2011   KNEE ARTHROSCOPY Left    LASIK     LUMBAR SPINE SURGERY  1990   x2   NASAL SINUS SURGERY  2001   NASAL SINUS SURGERY  2002   TOTAL KNEE ARTHROPLASTY Left 09/12/2017   Procedure: LEFT TOTAL KNEE ARTHROPLASTY;  Surgeon: Kathryne Hitch, MD;  Location: [facility];  Service: Orthopedics;  Laterality: Left;    FAMILY HISTORY: Family History  Problem Relation Age of Onset   Heart disease Father    Hypertension Father    Uterine cancer Mother        ?   Diabetes Other        GM   Diabetes Paternal Grandmother    Diabetes Sister    Lung cancer Paternal Uncle    Cancer Paternal Aunt        type unknown   Colon cancer Neg Hx    Esophageal cancer Neg Hx    Stomach cancer Neg Hx     Rectal cancer Neg Hx     SOCIAL HISTORY: Social History   Socioeconomic History  Marital status: Married    Spouse name: Not on file   Number of children: 2   Years of education: Not on file   Highest education level: Not on file  Occupational History   Occupation: retired Theatre stage manager  Tobacco Use   Smoking status: Never   Smokeless tobacco: Never  Vaping Use   Vaping Use: Never used  Substance and Sexual Activity   Alcohol use: No   Drug use: No   Sexual activity: Not on file  Other Topics Concern   Not on file  Social History Narrative   Married; retired Company secretary; daily caffeine use.  Lives with wife in [place]. Goes to the gym about 2 times a week.   Social Determinants of Health   Financial Resource Strain: Not on file  Food Insecurity: Not on file  Transportation Needs: Not on file  Physical Activity: Not on file  Stress: Not on file  Social Connections: Not on file  Intimate Partner Violence: Not on file      PHYSICAL EXAM Generalized: Well developed, in no acute distress   Neurological examination  Mentation: Alert oriented to time, place, history taking.  speech and language fluent   DIAGNOSTIC DATA (LABS, IMAGING, TESTING) - I reviewed patient records, labs, notes, testing and imaging myself where available.  Lab Results  Component Value Date   WBC 6.6 03/21/2020   HGB 16.5 03/21/2020   HCT 49.0 03/21/2020   MCV 82 03/21/2020   PLT 209 03/21/2020      Component Value Date/Time   NA 139 03/21/2020 1051   K 4.6 03/21/2020 1051   CL 103 03/21/2020 1051   CO2 20 03/21/2020 1051   GLUCOSE 139 (H) 03/21/2020 1051   GLUCOSE 168 (H) 09/13/2017 0505   BUN 18 03/21/2020 1051   CREATININE 0.98 03/21/2020 1051   CALCIUM 9.9 03/21/2020 1051   PROT 7.4 03/21/2020 1051   ALBUMIN 4.7 03/21/2020 1051   AST 25 03/21/2020 1051   ALT 46 (H) 03/21/2020 1051   ALKPHOS 90 03/21/2020 1051   BILITOT 0.7 03/21/2020 1051   GFRNONAA 78 03/21/2020 1051   GFRAA 90  03/21/2020 1051   Lab Results  Component Value Date   CHOL 120 03/21/2020   HDL 39 (L) 03/21/2020   LDLCALC 57 03/21/2020   TRIG 138 03/21/2020   CHOLHDL 3.1 03/21/2020   Lab Results  Component Value Date   HGBA1C 7.5 (H) 03/21/2020   No results found for: "VITAMINB12" Lab Results  Component Value Date   TSH 2.800 03/23/2019      ASSESSMENT AND PLAN 71 y.o. year old male  has a past medical history of Adenomatous colon polyp, Anal fissure, Arthritis, Basal cell carcinoma, CAD (coronary artery disease), DDD (degenerative disc disease), lumbar, Diabetes type 2, controlled (HCC), GERD (gastroesophageal reflux disease), Hemorrhoids, History of dizziness, History of nuclear stress test, HLD (hyperlipidemia), HTN (hypertension), Internal hemorrhoids, LBP (low back pain), Obesity, Orthostatic lightheadedness, Osteoarthritis, Perineal abscess (09/2012), Pneumonia (09/2005), PONV (postoperative nausea and vomiting), Renal cyst, right, Retinopathy, Right ureteral stone, Seasonal allergic rhinitis, Sleep apnea, Spondylosis, and Thoracic aortic aneurysm (HCC). here with:  OSA on CPAP  CPAP compliance excellent Residual AHI is good Encouraged patient to continue using CPAP nightly and > 4 hours each night F/U in 1 year or sooner if needed    Butch Penny, MSN, NP-C 12/04/2021, 1:50 PM [facility] [address] [place], [place] [address] [phone]

## 2021-12-04 ENCOUNTER — Telehealth (INDEPENDENT_AMBULATORY_CARE_PROVIDER_SITE_OTHER): Payer: Medicare Other | Admitting: Adult Health

## 2021-12-04 DIAGNOSIS — G4733 Obstructive sleep apnea (adult) (pediatric): Secondary | ICD-10-CM

## 2021-12-04 DIAGNOSIS — Z9989 Dependence on other enabling machines and devices: Secondary | ICD-10-CM | POA: Diagnosis not present

## 2022-04-02 NOTE — Progress Notes (Signed)
HPI: Follow-up coronary artery disease, thoracic aortic aneurysm, hypertension and hyperlipidemia. Cardiac catheterization in 2003 revealed 50% LAD, 50 to 60% first diagonal and 40% right coronary artery.  Most recent echocardiogram May 2017 showed normal LV function, grade 1 diastolic dysfunction, dilated ascending aorta at 45 mm, mild mitral regurgitation, mild left atrial enlargement and mild right ventricular enlargement.  Nuclear study August 2017 showed ejection fraction 53% with mild ischemia in the basal inferoseptal wall and apex; study felt to be low risk and therefore treated medically.  Also with history of orthostasis.  MRA August 2023 showed stable appearance of ascending thoracic aortic aneurysm measuring 4.2 cm; followed by Dr Cyndia Bent. Since last seen the patient denies any dyspnea on exertion, orthopnea, PND, pedal edema, palpitations, syncope or chest pain.   Current Outpatient Medications  Medication Sig Dispense Refill   amLODipine (NORVASC) 5 MG tablet Take 5 mg by mouth daily.     atorvastatin (LIPITOR) 40 MG tablet Take 40 mg by mouth daily.     clopidogrel (PLAVIX) 75 MG tablet Take 75 mg by mouth daily.     docusate sodium (COLACE) 100 MG capsule Take 100 mg by mouth at bedtime.     esomeprazole (NEXIUM) 40 MG capsule Take 40 mg by mouth daily.     FARXIGA 10 MG TABS tablet Take 10 mg by mouth daily.     fexofenadine (ALLEGRA) 180 MG tablet Take 180 mg by mouth daily as needed (for allergies).     FIBER PO Take 1 capsule by mouth 2 (two) times daily.     fluticasone (FLONASE) 50 MCG/ACT nasal spray Place 2 sprays into both nostrils daily as needed for allergies or rhinitis.     metFORMIN (GLUCOPHAGE-XR) 500 MG 24 hr tablet Take 500 mg by mouth 2 (two) times daily.      nitroGLYCERIN (NITROLINGUAL) 0.4 MG/SPRAY spray      olmesartan (BENICAR) 20 MG tablet TAKE ONE TABLET BY MOUTH DAILY 90 tablet 3   Semaglutide (OZEMPIC, 2 MG/DOSE, ) Inject 1 mg into the skin once a  week.     tamsulosin (FLOMAX) 0.4 MG CAPS capsule Take 0.4 mg by mouth at bedtime.      No current facility-administered medications for this visit.     Past Medical History:  Diagnosis Date   Adenomatous colon polyp    Anal fissure    Arthritis    Basal cell carcinoma    neck   CAD (coronary artery disease)    a. 2003 Cath: LAD 50, D1 50-60, RCA 40;  b. 12/2008 MV: no ischemia/infarct, EF 63%. // c. MV 8/17: mild inf-sept and apical ischemia, EF 53%, Low Risk   DDD (degenerative disc disease), lumbar    Diabetes type 2, controlled (HCC)    GERD (gastroesophageal reflux disease)    Hemorrhoids    History of dizziness    History of nuclear stress test    a. Myoview 8/17: EF 53%, mild inferoseptal and mild apical ischemia, low risk   HLD (hyperlipidemia)    HTN (hypertension)    Internal hemorrhoids    LBP (low back pain)    a. h/o surgery in 1991.   Obesity    Orthostatic lightheadedness    with heat exposure   Osteoarthritis    knees   Perineal abscess 09/2012   Pneumonia 09/2005   PONV (postoperative nausea and vomiting)    Renal cyst, right    Retinopathy    Mild, left eye  Right ureteral stone    Seasonal allergic rhinitis    Sleep apnea    a. on cpap.   Spondylosis    lumbar   Thoracic aortic aneurysm ([place])    a. MRA 5/17 ascending thoracic aorta measuring 40 mm - recommend annual follow-up // b.  Echo 5/17: Moderate concentric LVH, EF 55-60%, normal wall motion, grade 1 diastolic dysfunction, ascending aortic diameter 45 mm, mild MR, mild LAE // c. Chest MRA 6/18: stable ascending aortic aneurysm measuring 4.1 cm (4.0 cm in 5/17). FU 1 year.    Past Surgical History:  Procedure Laterality Date   anal fissure surgery  1981   CARDIAC CATHETERIZATION  2004   1 vessel CAD   COLONOSCOPY  2003,2011   KNEE ARTHROSCOPY Left    LASIK     LUMBAR SPINE SURGERY  1990   x2   NASAL SINUS SURGERY  2001   NASAL SINUS SURGERY  2002   TOTAL KNEE ARTHROPLASTY Left  09/12/2017   Procedure: LEFT TOTAL KNEE ARTHROPLASTY;  Surgeon: Mcarthur Rossetti, MD;  Location: [facility];  Service: Orthopedics;  Laterality: Left;    Social History   Socioeconomic History   Marital status: Married    Spouse name: Not on file   Number of children: 2   Years of education: Not on file   Highest education level: Not on file  Occupational History   Occupation: retired IT trainer  Tobacco Use   Smoking status: Never   Smokeless tobacco: Never  Vaping Use   Vaping Use: Never used  Substance and Sexual Activity   Alcohol use: No   Drug use: No   Sexual activity: Not on file  Other Topics Concern   Not on file  Social History Narrative   Married; retired Agricultural consultant; daily caffeine use.  Lives with wife in [place]. Goes to the gym about 2 times a week.   Social Determinants of Health   Financial Resource Strain: Not on file  Food Insecurity: Not on file  Transportation Needs: Not on file  Physical Activity: Not on file  Stress: Not on file  Social Connections: Not on file  Intimate Partner Violence: Not on file    Family History  Problem Relation Age of Onset   Heart disease Father    Hypertension Father    Uterine cancer Mother        ?   Diabetes Other        GM   Diabetes Paternal Grandmother    Diabetes Sister    Lung cancer Paternal Uncle    Cancer Paternal Aunt        type unknown   Colon cancer Neg Hx    Esophageal cancer Neg Hx    Stomach cancer Neg Hx    Rectal cancer Neg Hx     ROS: no fevers or chills, productive cough, hemoptysis, dysphasia, odynophagia, melena, hematochezia, dysuria, hematuria, rash, seizure activity, orthopnea, PND, pedal edema, claudication. Remaining systems are negative.  Physical Exam: Well-developed well-nourished in no acute distress.  Skin is warm and dry.  HEENT is normal.  Neck is supple.  Chest is clear to auscultation with normal expansion.  Cardiovascular exam is regular rate and rhythm.  Abdominal  exam nontender or distended. No masses palpated. Extremities show no edema. neuro grossly intact  ECG-normal sinus rhythm at a rate of 73, first-degree AV block, no ST changes.  Personally reviewed  A/P  1 thoracic aortic aneurysm-plan follow-up MRA August 2024.  2  coronary artery disease-he denies chest pain.  Continue medical therapy including Plavix and statin.  3 hyperlipidemia-continue statin.  Will obtain most recent lipids and liver from primary care's office.  4 hypertension-blood pressure controlled.  Continue present medications.  Kirk Ruths, MD

## 2022-04-16 ENCOUNTER — Ambulatory Visit: Payer: Medicare Other | Attending: Cardiology | Admitting: Cardiology

## 2022-04-16 ENCOUNTER — Encounter: Payer: Self-pay | Admitting: Cardiology

## 2022-04-16 VITALS — BP 138/72 | HR 73 | Ht 76.0 in | Wt 279.8 lb

## 2022-04-16 DIAGNOSIS — I2510 Atherosclerotic heart disease of native coronary artery without angina pectoris: Secondary | ICD-10-CM

## 2022-04-16 DIAGNOSIS — I712 Thoracic aortic aneurysm, without rupture, unspecified: Secondary | ICD-10-CM | POA: Diagnosis not present

## 2022-04-16 DIAGNOSIS — E785 Hyperlipidemia, unspecified: Secondary | ICD-10-CM

## 2022-04-16 DIAGNOSIS — I10 Essential (primary) hypertension: Secondary | ICD-10-CM | POA: Diagnosis not present

## 2022-04-16 NOTE — Patient Instructions (Signed)
  Follow-Up: At [facility], you and your health needs are our priority.  As part of our continuing mission to provide you with exceptional heart care, we have created designated Provider Care Teams.  These Care Teams include your primary Cardiologist (physician) and Advanced Practice Providers (APPs -  Physician Assistants and Nurse Practitioners) who all work together to provide you with the care you need, when you need it.  We recommend signing up for the patient portal called "MyChart".  Sign up information is provided on this After Visit Summary.  MyChart is used to connect with patients for Virtual Visits (Telemedicine).  Patients are able to view lab/test results, encounter notes, upcoming appointments, etc.  Non-urgent messages can be sent to your provider as well.   To learn more about what you can do with MyChart, go to [email or web address].    Your next appointment:   12 month(s)  Provider:   Brian Crenshaw MD    

## 2022-08-20 ENCOUNTER — Other Ambulatory Visit: Payer: Self-pay | Admitting: Cardiology

## 2022-08-27 ENCOUNTER — Telehealth: Payer: Self-pay | Admitting: *Deleted

## 2022-08-27 NOTE — Telephone Encounter (Signed)
   Pre-operative Risk Assessment    Patient Name: James Rodgers  DOB: 1950-10-29 MRN: [id number]      Request for Surgical Clearance    Procedure:   RIGHT INDEX FINGER AND RIGHT MIDDLE FINGER TRIGGER RELEASE  Date of Surgery:  Clearance TBD                                 Surgeon:  DR. Dominica Severin Surgeon's Group or Practice Name:  [facility] Phone number:  [phone] Fax number:  [phone] ATTN: Cordelia Pen WILLS   Type of Clearance Requested:   - Medical  - Pharmacy:  Hold Clopidogrel (Plavix)     Type of Anesthesia:  Local    Additional requests/questions:    Elpidio Anis   08/27/2022, 9:49 AM

## 2022-08-27 NOTE — Telephone Encounter (Signed)
   Name: James Rodgers  DOB: 07/05/1950  MRN: [id number]  Primary Cardiologist: Olga Millers, MD   Preoperative team, please contact this patient and set up a phone call appointment for further preoperative risk assessment. Please obtain consent and complete medication review. Thank you for your help.  I confirm that guidance regarding antiplatelet and oral anticoagulation therapy has been completed and, if necessary, noted below.  Per office protocol, if patient is without any new symptoms or concerns at the time of their virtual visit, he may hold Plavix for 5 days prior to procedure. Please resume Plavix as soon as possible postprocedure, at the discretion of the surgeon.     Joylene Grapes, NP 08/27/2022, 11:40 AM [facility]

## 2022-08-27 NOTE — Telephone Encounter (Signed)
Set tele clearance up for June 25.  Will take pt out of preop.    Patient Consent for Virtual Visit         James Rodgers has provided verbal consent on 08/27/2022 for a virtual visit (video or telephone).   CONSENT FOR VIRTUAL VISIT FOR:  James Rodgers  By participating in this virtual visit I agree to the following:  I hereby voluntarily request, consent and authorize [facility] and its employed or contracted physicians, physician assistants, nurse practitioners or other licensed health care professionals (the Practitioner), to provide me with telemedicine health care services (the "Services") as deemed necessary by the treating Practitioner. I acknowledge and consent to receive the Services by the Practitioner via telemedicine. I understand that the telemedicine visit will involve communicating with the Practitioner through live audiovisual communication technology and the disclosure of certain medical information by electronic transmission. I acknowledge that I have been given the opportunity to request an in-person assessment or other available alternative prior to the telemedicine visit and am voluntarily participating in the telemedicine visit.  I understand that I have the right to withhold or withdraw my consent to the use of telemedicine in the course of my care at any time, without affecting my right to future care or treatment, and that the Practitioner or I may terminate the telemedicine visit at any time. I understand that I have the right to inspect all information obtained and/or recorded in the course of the telemedicine visit and may receive copies of available information for a reasonable fee.  I understand that some of the potential risks of receiving the Services via telemedicine include:  Delay or interruption in medical evaluation due to technological equipment failure or disruption; Information transmitted may not be sufficient (e.g. poor resolution of  images) to allow for appropriate medical decision making by the Practitioner; and/or  In rare instances, security protocols could fail, causing a breach of personal health information.  Furthermore, I acknowledge that it is my responsibility to provide information about my medical history, conditions and care that is complete and accurate to the best of my ability. I acknowledge that Practitioner's advice, recommendations, and/or decision may be based on factors not within their control, such as incomplete or inaccurate data provided by me or distortions of diagnostic images or specimens that may result from electronic transmissions. I understand that the practice of medicine is not an exact science and that Practitioner makes no warranties or guarantees regarding treatment outcomes. I acknowledge that a copy of this consent can be made available to me via my patient portal [facility] MyChart), or I can request a printed copy by calling the office of [facility].    I understand that my insurance will be billed for this visit.   I have read or had this consent read to me. I understand the contents of this consent, which adequately explains the benefits and risks of the Services being provided via telemedicine.  I have been provided ample opportunity to ask questions regarding this consent and the Services and have had my questions answered to my satisfaction. I give my informed consent for the services to be provided through the use of telemedicine in my medical care

## 2022-09-02 NOTE — Progress Notes (Unsigned)
Virtual Visit via Telephone Note   Because of James Rodgers co-morbid illnesses, he is at least at moderate risk for complications without adequate follow up.  This format is felt to be most appropriate for this patient at this time.  The patient did not have access to video technology/had technical difficulties with video requiring transitioning to audio format only (telephone).  All issues noted in this document were discussed and addressed.  No physical exam could be performed with this format.  Please refer to the patient's chart for his consent to telehealth for [facility].  Evaluation Performed:  Preoperative cardiovascular risk assessment _____________   Date:  09/02/2022   Patient ID:  James Rodgers, DOB June 27, 1950, MRN [id number] Patient Location:  Home Provider location:   Office  Primary Care Provider:  Garlan Fillers, MD Primary Cardiologist:  James Millers, MD  Chief Complaint / Patient Profile   72 y.o. y/o male with a h/o coronary artery disease, hypertension, OSA who is pending right index finger and right middle finger trigger release and presents today for telephonic preoperative cardiovascular risk assessment.  History of Present Illness    James Rodgers is a 72 y.o. male who presents via audio/video conferencing for a telehealth visit today.  Pt was last seen in cardiology clinic on 04/16/2022 by James Rodgers.  At that time James Rodgers was doing well .  The patient is now pending procedure as outlined above. Since his last visit, he continues to do well from a cardiac standpoint.  Today he denies chest pain, shortness of breath, lower extremity edema, fatigue, palpitations, melena, hematuria, hemoptysis, diaphoresis, weakness, presyncope, syncope, orthopnea, and PND.   Past Medical History    Past Medical History:  Diagnosis Date   Adenomatous colon polyp    Anal fissure    Arthritis    Basal cell carcinoma    neck    CAD (coronary artery disease)    a. 2003 Cath: LAD 50, D1 50-60, RCA 40;  b. 12/2008 MV: no ischemia/infarct, EF 63%. // c. MV 8/17: mild inf-sept and apical ischemia, EF 53%, Low Risk   DDD (degenerative disc disease), lumbar    Diabetes type 2, controlled (HCC)    GERD (gastroesophageal reflux disease)    Hemorrhoids    History of dizziness    History of nuclear stress test    a. Myoview 8/17: EF 53%, mild inferoseptal and mild apical ischemia, low risk   HLD (hyperlipidemia)    HTN (hypertension)    Internal hemorrhoids    LBP (low back pain)    a. h/o surgery in 1991.   Obesity    Orthostatic lightheadedness    with heat exposure   Osteoarthritis    knees   Perineal abscess 09/2012   Pneumonia 09/2005   PONV (postoperative nausea and vomiting)    Renal cyst, right    Retinopathy    Mild, left eye   Right ureteral stone    Seasonal allergic rhinitis    Sleep apnea    a. on cpap.   Spondylosis    lumbar   Thoracic aortic aneurysm (HCC)    a. MRA 5/17 ascending thoracic aorta measuring 40 mm - recommend annual follow-up // b.  Echo 5/17: Moderate concentric LVH, EF 55-60%, normal wall motion, grade 1 diastolic dysfunction, ascending aortic diameter 45 mm, mild MR, mild LAE // c. Chest MRA 6/18: stable ascending aortic aneurysm measuring 4.1 cm (4.0 cm in 5/17). FU 1  year.   Past Surgical History:  Procedure Laterality Date   anal fissure surgery  1981   CARDIAC CATHETERIZATION  2004   1 vessel CAD   COLONOSCOPY  2003,2011   KNEE ARTHROSCOPY Left    LASIK     LUMBAR SPINE SURGERY  1990   x2   NASAL SINUS SURGERY  2001   NASAL SINUS SURGERY  2002   TOTAL KNEE ARTHROPLASTY Left 09/12/2017   Procedure: LEFT TOTAL KNEE ARTHROPLASTY;  Surgeon: James Hitch, MD;  Location: [facility];  Service: Orthopedics;  Laterality: Left;    Allergies  Allergies  Allergen Reactions   Cipro [Ciprofloxacin Hcl]     Due to thoracic anuerysm   Codeine Other (See Comments)     dizzy    Home Medications    Prior to Admission medications   Medication Sig Start Date End Date Taking? Authorizing Provider  amLODipine (NORVASC) 5 MG tablet Take 5 mg by mouth daily. 01/22/22   [provider]  atorvastatin (LIPITOR) 40 MG tablet Take 40 mg by mouth daily. 09/13/20   [provider]  clopidogrel (PLAVIX) 75 MG tablet Take 75 mg by mouth daily.    [provider]  docusate sodium (COLACE) 100 MG capsule Take 100 mg by mouth at bedtime.    [provider]  esomeprazole (NEXIUM) 40 MG capsule Take 40 mg by mouth daily.    [provider]  FARXIGA 10 MG TABS tablet Take 10 mg by mouth daily. 07/25/18   [provider]  fexofenadine (ALLEGRA) 180 MG tablet Take 180 mg by mouth daily as needed (for allergies).    [provider]  FIBER PO Take 1 capsule by mouth 2 (two) times daily.    [provider]  fluticasone (FLONASE) 50 MCG/ACT nasal spray Place 2 sprays into both nostrils daily as needed for allergies or rhinitis.    [provider]  metFORMIN (GLUCOPHAGE-XR) 500 MG 24 hr tablet Take 500 mg by mouth 2 (two) times daily.  06/27/17   [provider]  nitroGLYCERIN (NITROLINGUAL) 0.4 MG/SPRAY spray     [provider]  olmesartan (BENICAR) 20 MG tablet TAKE 1 TABLET BY MOUTH DAILY 08/20/22   James Bunting, MD  Semaglutide (OZEMPIC, 2 MG/DOSE, [place]) Inject 2 mg into the skin once a week.    [provider]  tamsulosin (FLOMAX) 0.4 MG CAPS capsule Take 0.4 mg by mouth at bedtime.     [provider]    Physical Exam    Vital Signs:  James Rodgers does not have vital signs available for review today.  Given telephonic nature of communication, physical exam is limited. AAOx3. NAD. Normal affect.  Speech and respirations are unlabored.  Accessory Clinical Findings    None  Assessment & Plan    1.  Preoperative Cardiovascular Risk Assessment: Right  index finger and right middle finger trigger release, James Rodgers, which orthopedics [phone]      Primary Cardiologist: James Millers, MD  Chart reviewed as part of pre-operative protocol coverage. Given past medical history and time since last visit, based on ACC/AHA guidelines, SUKHRAJ ESQUIVIAS would be at acceptable risk for the planned procedure without further cardiovascular testing.   His RCRI is a class II risk, 0.9% risk of major cardiac event.  He is able to complete greater than 4 METS of physical activity.  Per office protocol, if patient is without any new symptoms or concerns at the time of  their virtual visit, he may hold Plavix for 5 days prior to procedure. Please resume Plavix as soon as possible postprocedure, at the discretion of the surgeon.   Patient was advised that if he develops new symptoms prior to surgery to contact our office to arrange a follow-up appointment.  He verbalized understanding.  I will route this recommendation to the requesting party via Epic fax function and remove from pre-op pool.       Time:   Today, I have spent 5 minutes with the patient with telehealth technology discussing medical history, symptoms, and management plan.  Prior to his phone evaluation I spent greater than 10 minutes reviewing past medical history and cardiac medications.   Ronney Asters, NP  09/02/2022, 10:51 AM

## 2022-09-03 ENCOUNTER — Ambulatory Visit: Payer: Medicare Other | Attending: Cardiology

## 2022-09-03 DIAGNOSIS — Z01810 Encounter for preprocedural cardiovascular examination: Secondary | ICD-10-CM | POA: Diagnosis not present

## 2022-10-21 ENCOUNTER — Other Ambulatory Visit: Payer: Self-pay | Admitting: Surgery

## 2022-10-21 DIAGNOSIS — I7121 Aneurysm of the ascending aorta, without rupture: Secondary | ICD-10-CM

## 2022-11-21 ENCOUNTER — Encounter: Payer: Self-pay | Admitting: *Deleted

## 2022-11-21 NOTE — Progress Notes (Signed)
PATIENT: James Rodgers DOB: October 24, 1950  REASON FOR VISIT: follow up HISTORY FROM: patient PRIMARY NEUROLOGIST: Dr. Vickey Huger  Virtual Visit via Video Note  I connected with James Rodgers on 11/22/22 at  8:45 AM EDT by a video enabled telemedicine application located remotely at [facility] and verified that I am speaking with the correct person using two identifiers who was located at their own home.   I discussed the limitations of evaluation and management by telemedicine and the availability of in person appointments. The patient expressed understanding and agreed to proceed.   PATIENT: James Rodgers DOB: [birth year]  REASON FOR VISIT: follow up HISTORY FROM: patient  HISTORY OF PRESENT ILLNESS: Today 11/22/22:  James Rodgers is a 72 y.o. male with a history of OSA on CPAP. Returns today for follow-up.  He reports that the CPAP is working well.  He states occasionally during the night if he gets up and goes to the bathroom and then comes back and puts his machine on the pressure seems too low.  His download is below     12/04/21: James Rodgers is a 72 year old male with a history of obstructive sleep apnea on CPAP.  He returns today for follow-up.  His download is below.  Reports that the CPAP is working well.  He states he has had the CPAP for quite a while and will probably need a new one in the future.  He is not interested in a new machine today.   11/15/20: James Rodgers is a 72 year old male with a history of obstructive sleep apnea on CPAP.  He reports that the CPAP is working well.  He reports that he is due for new machine but his is working fine therefore he is okay with keeping his current machine.  He returns today for an evaluation.       HISTORY 12/06/19:   James Rodgers is a 72 year old male with a history of obstructive sleep apnea on CPAP.  His download indicates that he uses machine 29 out of 30 days for  compliance of 97%.  He uses machine greater than 4 hours each night.  On average he uses his machine 7 hours and 34 minutes.  His residual AHI is 0.9 on 8 to 15 cm of water with EPR of three.  Leak in the 95th percentile is 12.5 L/min.  Reports that his CPAP is working well for him.  He also has a travel machine that he uses.  He returns today for an evaluation.   REVIEW OF SYSTEMS: Out of a complete 14 system review of symptoms, the patient complains only of the following symptoms, and all other reviewed systems are negative.  ALLERGIES: Allergies  Allergen Reactions   Cipro [Ciprofloxacin Hcl]     Due to thoracic anuerysm   Codeine Other (See Comments)    dizzy    HOME MEDICATIONS: Outpatient Medications Prior to Visit  Medication Sig Dispense Refill   amLODipine (NORVASC) 5 MG tablet Take 5 mg by mouth daily.     atorvastatin (LIPITOR) 40 MG tablet Take 40 mg by mouth daily.     clopidogrel (PLAVIX) 75 MG tablet Take 75 mg by mouth daily.     docusate sodium (COLACE) 100 MG capsule Take 100 mg by mouth at bedtime.     esomeprazole (NEXIUM) 40 MG capsule Take 40 mg by mouth daily.     FARXIGA 10 MG TABS tablet Take 10 mg by mouth daily.  fexofenadine (ALLEGRA) 180 MG tablet Take 180 mg by mouth daily as needed (for allergies).     FIBER PO Take 1 capsule by mouth 2 (two) times daily.     fluticasone (FLONASE) 50 MCG/ACT nasal spray Place 2 sprays into both nostrils daily as needed for allergies or rhinitis.     metFORMIN (GLUCOPHAGE-XR) 500 MG 24 hr tablet Take 500 mg by mouth 2 (two) times daily.      nitroGLYCERIN (NITROLINGUAL) 0.4 MG/SPRAY spray      olmesartan (BENICAR) 20 MG tablet TAKE 1 TABLET BY MOUTH DAILY 90 tablet 3   Semaglutide (OZEMPIC, 2 MG/DOSE, [place]) Inject 2 mg into the skin once a week.     tamsulosin (FLOMAX) 0.4 MG CAPS capsule Take 0.4 mg by mouth at bedtime.      No facility-administered medications prior to visit.    PAST MEDICAL HISTORY: Past Medical  History:  Diagnosis Date   Adenomatous colon polyp    Anal fissure    Arthritis    Basal cell carcinoma    neck   CAD (coronary artery disease)    a. 2003 Cath: LAD 50, D1 50-60, RCA 40;  b. 12/2008 MV: no ischemia/infarct, EF 63%. // c. MV 8/17: mild inf-sept and apical ischemia, EF 53%, Low Risk   DDD (degenerative disc disease), lumbar    Diabetes type 2, controlled (HCC)    GERD (gastroesophageal reflux disease)    Hemorrhoids    History of dizziness    History of nuclear stress test    a. Myoview 8/17: EF 53%, mild inferoseptal and mild apical ischemia, low risk   HLD (hyperlipidemia)    HTN (hypertension)    Internal hemorrhoids    LBP (low back pain)    a. h/o surgery in 1991.   Obesity    Orthostatic lightheadedness    with heat exposure   Osteoarthritis    knees   Perineal abscess 09/2012   Pneumonia 09/2005   PONV (postoperative nausea and vomiting)    Renal cyst, right    Retinopathy    Mild, left eye   Right ureteral stone    Seasonal allergic rhinitis    Sleep apnea    a. on cpap.   Spondylosis    lumbar   Thoracic aortic aneurysm (HCC)    a. MRA 5/17 ascending thoracic aorta measuring 40 mm - recommend annual follow-up // b.  Echo 5/17: Moderate concentric LVH, EF 55-60%, normal wall motion, grade 1 diastolic dysfunction, ascending aortic diameter 45 mm, mild MR, mild LAE // c. Chest MRA 6/18: stable ascending aortic aneurysm measuring 4.1 cm (4.0 cm in 5/17). FU 1 year.    PAST SURGICAL HISTORY: Past Surgical History:  Procedure Laterality Date   anal fissure surgery  1981   CARDIAC CATHETERIZATION  2004   1 vessel CAD   COLONOSCOPY  2003,2011   KNEE ARTHROSCOPY Left    LASIK     LUMBAR SPINE SURGERY  1990   x2   NASAL SINUS SURGERY  2001   NASAL SINUS SURGERY  2002   TOTAL KNEE ARTHROPLASTY Left 09/12/2017   Procedure: LEFT TOTAL KNEE ARTHROPLASTY;  Surgeon: Kathryne Hitch, MD;  Location: [facility];  Service: Orthopedics;  Laterality: Left;     FAMILY HISTORY: Family History  Problem Relation Age of Onset   Heart disease Father    Hypertension Father    Uterine cancer Mother        ?   Diabetes Other  GM   Diabetes Paternal Grandmother    Diabetes Sister    Lung cancer Paternal Uncle    Cancer Paternal Aunt        type unknown   Colon cancer Neg Hx    Esophageal cancer Neg Hx    Stomach cancer Neg Hx    Rectal cancer Neg Hx     SOCIAL HISTORY: Social History   Socioeconomic History   Marital status: Married    Spouse name: Not on file   Number of children: 2   Years of education: Not on file   Highest education level: Not on file  Occupational History   Occupation: retired Theatre stage manager  Tobacco Use   Smoking status: Never   Smokeless tobacco: Never  Vaping Use   Vaping status: Never Used  Substance and Sexual Activity   Alcohol use: No   Drug use: No   Sexual activity: Not on file  Other Topics Concern   Not on file  Social History Narrative   Married; retired Company secretary; daily caffeine use.  Lives with wife in [place]. Goes to the gym about 2 times a week.   Social Determinants of Health   Financial Resource Strain: Not on file  Food Insecurity: Not on file  Transportation Needs: Not on file  Physical Activity: Not on file  Stress: Not on file  Social Connections: Not on file  Intimate Partner Violence: Not on file      PHYSICAL EXAM Generalized: Well developed, in no acute distress   Neurological examination  Mentation: Alert oriented to time, place, history taking.  speech and language fluent   DIAGNOSTIC DATA (LABS, IMAGING, TESTING) - I reviewed patient records, labs, notes, testing and imaging myself where available.  Lab Results  Component Value Date   WBC 6.6 03/21/2020   HGB 16.5 03/21/2020   HCT 49.0 03/21/2020   MCV 82 03/21/2020   PLT 209 03/21/2020      Component Value Date/Time   NA 139 03/21/2020 1051   K 4.6 03/21/2020 1051   CL 103 03/21/2020 1051   CO2  20 03/21/2020 1051   GLUCOSE 139 (H) 03/21/2020 1051   GLUCOSE 168 (H) 09/13/2017 0505   BUN 18 03/21/2020 1051   CREATININE 0.98 03/21/2020 1051   CALCIUM 9.9 03/21/2020 1051   PROT 7.4 03/21/2020 1051   ALBUMIN 4.7 03/21/2020 1051   AST 25 03/21/2020 1051   ALT 46 (H) 03/21/2020 1051   ALKPHOS 90 03/21/2020 1051   BILITOT 0.7 03/21/2020 1051   GFRNONAA 78 03/21/2020 1051   GFRAA 90 03/21/2020 1051   Lab Results  Component Value Date   CHOL 120 03/21/2020   HDL 39 (L) 03/21/2020   LDLCALC 57 03/21/2020   TRIG 138 03/21/2020   CHOLHDL 3.1 03/21/2020   Lab Results  Component Value Date   HGBA1C 7.5 (H) 03/21/2020   No results found for: "VITAMINB12" Lab Results  Component Value Date   TSH 2.800 03/23/2019      ASSESSMENT AND PLAN 72 y.o. year old male  has a past medical history of Adenomatous colon polyp, Anal fissure, Arthritis, Basal cell carcinoma, CAD (coronary artery disease), DDD (degenerative disc disease), lumbar, Diabetes type 2, controlled (HCC), GERD (gastroesophageal reflux disease), Hemorrhoids, History of dizziness, History of nuclear stress test, HLD (hyperlipidemia), HTN (hypertension), Internal hemorrhoids, LBP (low back pain), Obesity, Orthostatic lightheadedness, Osteoarthritis, Perineal abscess (09/2012), Pneumonia (09/2005), PONV (postoperative nausea and vomiting), Renal cyst, right, Retinopathy, Right ureteral stone, Seasonal allergic rhinitis,  Sleep apnea, Spondylosis, and Thoracic aortic aneurysm (HCC). here with:  OSA on CPAP  CPAP compliance excellent Residual AHI is good Encouraged patient to continue using CPAP nightly and > 4 hours each night F/U in 1 year or sooner if needed    James Penny, MSN, NP-C 11/22/2022, 8:30 AM [facility] [address], [address] [place], [place] [address] [phone]

## 2022-11-22 ENCOUNTER — Telehealth (INDEPENDENT_AMBULATORY_CARE_PROVIDER_SITE_OTHER): Payer: Medicare Other | Admitting: Adult Health

## 2022-11-22 DIAGNOSIS — G4733 Obstructive sleep apnea (adult) (pediatric): Secondary | ICD-10-CM

## 2022-11-25 ENCOUNTER — Telehealth: Payer: Self-pay | Admitting: *Deleted

## 2022-11-25 NOTE — Telephone Encounter (Signed)
[employer] confirmed receipt of order.

## 2022-11-25 NOTE — Telephone Encounter (Signed)
Pressure changed in [facility] portal, now 9-15 from 8-15. Order sent to [facility].

## 2022-11-27 NOTE — Progress Notes (Signed)
[address]       [place] [address]             [phone]        ANIVAL SALADO [id number] 07/13/1950  History of Present Illness:  James Rodgers is a 72 yo male with known history HLD, HTN, BPH, DM, CAD, and Ascending Aortic Aneurysm.  He presents today for 1 year surveillance with imaging study.  This has been measuring 4.2 cm and has remained stable.  Overall the patient is doing well.  He denies chest pain shortness of breath.  He does mention that he notices when he plays golf and stands quickly that he gets little dizziness.  He said there have been occasions where his BP is dropping in the 60s.  He remains compliant on his medication regimen.  Current Outpatient Medications on File Prior to Visit  Medication Sig Dispense Refill   amLODipine (NORVASC) 5 MG tablet Take 5 mg by mouth daily.     atorvastatin (LIPITOR) 40 MG tablet Take 40 mg by mouth daily.     clopidogrel (PLAVIX) 75 MG tablet Take 75 mg by mouth daily.     docusate sodium (COLACE) 100 MG capsule Take 100 mg by mouth at bedtime.     esomeprazole (NEXIUM) 40 MG capsule Take 40 mg by mouth daily.     FARXIGA 10 MG TABS tablet Take 10 mg by mouth daily.     fexofenadine (ALLEGRA) 180 MG tablet Take 180 mg by mouth daily as needed (for allergies).     FIBER PO Take 1 capsule by mouth 2 (two) times daily.     fluticasone (FLONASE) 50 MCG/ACT nasal spray Place 2 sprays into both nostrils daily as needed for allergies or rhinitis.     metFORMIN (GLUCOPHAGE-XR) 500 MG 24 hr tablet Take 500 mg by mouth 2 (two) times daily.      nitroGLYCERIN (NITROLINGUAL) 0.4 MG/SPRAY spray      olmesartan (BENICAR) 20 MG tablet TAKE 1 TABLET BY MOUTH DAILY 90 tablet 3   Semaglutide (OZEMPIC, 2 MG/DOSE, [place]) Inject 2 mg into the skin once a week.     tamsulosin (FLOMAX) 0.4 MG CAPS capsule Take 0.4 mg by mouth at bedtime.      No current facility-administered medications on file prior to visit.     BP  (!) 171/79 (BP Location: Left Arm, Patient Position: Sitting)   Pulse 77   Resp 18   Ht 6\' 4"  (1.93 m)   Wt 271 lb (122.9 kg)   SpO2 95% Comment: RA  BMI 32.99 kg/m   Physical Exam  Gen: NAD Heart: RRR Lungs: CTA bilaterally Ext: no edema Neck: no bruit Neuro: grossly normal  CTA Results:  Pending.. my review Aneurysm appears unchanged  A/P:  Ascending Aortic Aneurysm- measuring 4.2 stable in my measurement, however not officially read. He does not require surgical intervention and we will continue routine surveillance imaging HTN- on Benicar, Norvasc, sounds like he is experiencing orthostatic changes at times, could be due to [place].. I encouraged patient to monitor BP and he may require reduction in medictaion HLD- on statin DM OSA  RTC in 1 year  Risk Modification:  Statin:  yes  Smoking cessation instruction/counseling given:  Never smoker  Patient was counseled on importance of Blood Pressure Control.  Despite Medical intervention if the patient notices persistently elevated blood pressure readings.  They are instructed to contact their Primary Care Physician  Please avoid use of Fluoroquinolones as this can potentially increase your risk of Aortic Rupture and/or Dissection  Patient educated on signs and symptoms of Aortic Dissection, handout also provided in AVS  Ellenore Roscoe, PA-C 12/04/22

## 2022-11-27 NOTE — Patient Instructions (Signed)
Patient is counseled regarding the importance of long term risk factor modification as they pertain to the presence of ischemic heart disease including avoiding the use of all tobacco products, dietary modifications and medical therapy for diabetes, cholesterol and lipid management, and regular exercise.     Make every effort to maintain a "heart-healthy" lifestyle with regular physical exercise and adherence to a low-fat, low-carbohydrate diet.  Continue to seek regular follow-up appointments with your primary care physician and/or cardiologist.  AVOID FLOUROQUINOLONES (ex: Cipro) as this class of antibiotics can increase your risk of aortic dissection

## 2022-12-02 ENCOUNTER — Other Ambulatory Visit: Payer: Medicare Other

## 2022-12-02 ENCOUNTER — Ambulatory Visit: Payer: Medicare Other

## 2022-12-04 ENCOUNTER — Ambulatory Visit: Payer: Medicare Other

## 2022-12-04 ENCOUNTER — Ambulatory Visit
Admission: RE | Admit: 2022-12-04 | Discharge: 2022-12-04 | Disposition: A | Discharge status: Home / Self Care | Payer: Medicare Other | Source: Ambulatory Visit | Attending: Surgery | Admitting: Surgery

## 2022-12-04 VITALS — BP 127/79 | HR 77 | Resp 18 | Ht 76.0 in | Wt 271.0 lb

## 2022-12-04 DIAGNOSIS — I7121 Aneurysm of the ascending aorta, without rupture: Secondary | ICD-10-CM

## 2022-12-04 MED ORDER — IOPAMIDOL (ISOVUE-370) INJECTION 76%
200.0000 mL | Freq: Once | INTRAVENOUS | Status: AC | PRN
  Administered 2022-12-04: 75 mL via INTRAVENOUS

## 2023-01-20 ENCOUNTER — Other Ambulatory Visit: Payer: Self-pay | Admitting: Internal Medicine

## 2023-01-20 DIAGNOSIS — M5441 Lumbago with sciatica, right side: Secondary | ICD-10-CM

## 2023-02-14 ENCOUNTER — Ambulatory Visit
Admission: RE | Admit: 2023-02-14 | Discharge: 2023-02-14 | Disposition: A | Discharge status: Home / Self Care | Payer: Medicare Other | Source: Ambulatory Visit | Attending: Internal Medicine | Admitting: Internal Medicine

## 2023-02-14 DIAGNOSIS — M5442 Lumbago with sciatica, left side: Secondary | ICD-10-CM

## 2023-04-22 ENCOUNTER — Encounter: Payer: Self-pay | Admitting: Physician Assistant

## 2023-04-22 ENCOUNTER — Ambulatory Visit: Payer: Medicare Other | Attending: Physician Assistant | Admitting: Physician Assistant

## 2023-04-22 VITALS — BP 140/82 | HR 68 | Ht 77.0 in | Wt 284.4 lb

## 2023-04-22 DIAGNOSIS — I10 Essential (primary) hypertension: Secondary | ICD-10-CM

## 2023-04-22 DIAGNOSIS — I2510 Atherosclerotic heart disease of native coronary artery without angina pectoris: Secondary | ICD-10-CM | POA: Diagnosis not present

## 2023-04-22 DIAGNOSIS — E119 Type 2 diabetes mellitus without complications: Secondary | ICD-10-CM

## 2023-04-22 DIAGNOSIS — E785 Hyperlipidemia, unspecified: Secondary | ICD-10-CM

## 2023-04-22 DIAGNOSIS — I712 Thoracic aortic aneurysm, without rupture, unspecified: Secondary | ICD-10-CM | POA: Diagnosis not present

## 2023-04-22 NOTE — Patient Instructions (Signed)
Medication Instructions:  NO CHANGES *If you need a refill on your cardiac medications before your next appointment, please call your pharmacy*   Lab Work: NO LABS If you have labs (blood work) drawn today and your tests are completely normal, you will receive your results only by: MyChart Message (if you have MyChart) OR A paper copy in the mail If you have any lab test that is abnormal or we need to change your treatment, we will call you to review the results.   Testing/Procedures: NO TESTING   Follow-Up: At [facility], you and your health needs are our priority.  As part of our continuing mission to provide you with exceptional heart care, we have created designated Provider Care Teams.  These Care Teams include your primary Cardiologist (physician) and Advanced Practice Providers (APPs -  Physician Assistants and Nurse Practitioners) who all work together to provide you with the care you need, when you need it.   Your next appointment:   1 year(s)  Provider:   Olga Millers, MD

## 2023-04-22 NOTE — Progress Notes (Unsigned)
  Cardiology Office Note:  .   Date:  04/22/2023  ID:  James Rodgers, DOB [birth year], MRN [id number] PCP: Garlan Fillers, MD  [facility] Providers Cardiologist:  Olga Millers, MD { Click to update primary MD,subspecialty MD or APP then REFRESH:1}   History of Present Illness: .   James Rodgers is a 73 y.o. male with PMH of CAD, TAA, HTN, HLD and DM II.  Previous cardiac catheterization performed in 2003 revealed a 50% LAD lesion, 50 to 60% D1 lesion, and 40% RCA lesion.  Echocardiogram in May 2017 showed normal EF, grade 1 DD, dilated ascending aorta at 45 mm, mild mitral regurgitation, mild left atrial enlargement and mild right ventricular enlargement.  Myoview in August 2017 showed EF 53%, mild ischemia in the basal inferoseptal wall and apex, study was felt to be low risk, therefore treated medically.  He has a history of orthostasis.  MRA in August 2023 showed stable appearance ascending thoracic aortic aneurysm measuring 4.2 cm, this is being followed by Dr. Laneta Simmers.  He was last seen by Dr. [name] in February 2024 at which time he was doing well.  He is also being followed by neurology service for obstructive sleep apnea on CPAP.  Patient presents today for annual follow-up.  He denies any recent chest pain or shortness of breath.  He has no lower extremity edema, orthopnea or PND.  He goes to the gym several times a week along with his wife, he has not had any exertional symptoms recently.  He his last lipid panel obtained in October 2024 showed a total cholesterol 133, HDL 37, LDL 72, triglyceride 137.  Hemoglobin A1c is very well-controlled at 6.5.  His blood pressure is slightly high today, even on manual recheck by myself it was 144/90.  However he says his blood pressures usually less than 130 systolic at home.  During his last visit with CT surgery, his blood pressure was in the 120s.  I decided to hold off on adjusting his blood pressure medication based off on  single day's elevated blood pressure.   ROS: ***  Studies Reviewed: .        *** Risk Assessment/Calculations:   {Does this patient have ATRIAL FIBRILLATION?:[phone]} The patient's 1st BP is elevated (>139/89)*** Repeat BP and {Click to enter a 2nd BP Refresh Note  :1}       Physical Exam:   VS:  BP (!) 140/82 (BP Location: Right Arm, Patient Position: Sitting, Cuff Size: Normal)   Pulse 68   Ht 6\' 5"  (1.956 m)   Wt 284 lb 6.4 oz (129 kg)   BMI 33.72 kg/m    Wt Readings from Last 3 Encounters:  04/22/23 284 lb 6.4 oz (129 kg)  12/04/22 271 lb (122.9 kg)  04/16/22 279 lb 12.8 oz (126.9 kg)    GEN: Well nourished, well developed in no acute distress NECK: No JVD; No carotid bruits CARDIAC: ***RRR, no murmurs, rubs, gallops RESPIRATORY:  Clear to auscultation without rales, wheezing or rhonchi  ABDOMEN: Soft, non-tender, non-distended EXTREMITIES:  No edema; No deformity   ASSESSMENT AND PLAN: .   ***    {Are you ordering a CV Procedure (e.g. stress test, cath, DCCV, TEE, etc)?   Press F2        :[id number]}  Dispo: ***  Signed, Azalee Course, PA

## 2023-08-29 ENCOUNTER — Other Ambulatory Visit: Payer: Self-pay | Admitting: *Deleted

## 2023-08-29 MED ORDER — OLMESARTAN MEDOXOMIL 20 MG PO TABS: 20.0000 mg | ORAL_TABLET | Freq: Every day | ORAL | 2 refills | Status: AC

## 2023-10-16 ENCOUNTER — Other Ambulatory Visit: Payer: Self-pay | Admitting: Surgery

## 2023-10-16 DIAGNOSIS — I7121 Aneurysm of the ascending aorta, without rupture: Secondary | ICD-10-CM

## 2023-11-24 ENCOUNTER — Encounter: Payer: Self-pay | Admitting: *Deleted

## 2023-11-25 ENCOUNTER — Telehealth (INDEPENDENT_AMBULATORY_CARE_PROVIDER_SITE_OTHER): Payer: Medicare Other | Admitting: Adult Health

## 2023-11-25 DIAGNOSIS — G4733 Obstructive sleep apnea (adult) (pediatric): Secondary | ICD-10-CM

## 2023-11-25 NOTE — Patient Instructions (Signed)
 Continue using CPAP nightly and greater than 4 hours each night If your symptoms worsen or you develop new symptoms please let us  know.

## 2023-11-25 NOTE — Progress Notes (Signed)
 PATIENT: James Rodgers DOB: 1950-08-22  REASON FOR VISIT: follow up HISTORY FROM: patient PRIMARY NEUROLOGIST: Dr. Chalice  Virtual Visit via Video Note  I connected with James Rodgers on 11/25/23 at  1:45 PM EDT by a video enabled telemedicine application located remotely at [facility] and verified that I am speaking with the correct person using two identifiers who was located at their own home.   I discussed the limitations of evaluation and management by telemedicine and the availability of in person appointments. The patient expressed understanding and agreed to proceed.   PATIENT: James Rodgers DOB: [birth year]  REASON FOR VISIT: follow up HISTORY FROM: patient  HISTORY OF PRESENT ILLNESS: Today 11/25/23:  James Rodgers is a 73 y.o. male with a history of OSA on CPAP. Returns today for follow-up.  He reports that CPAP is working well.  He denies any new issues.  He is currently wearing the nasal pillows.  Returns today for an evaluation.        11/22/22 James Rodgers is a 73 y.o. male with a history of OSA on CPAP. Returns today for follow-up.  He reports that the CPAP is working well.  He states occasionally during the night if he gets up and goes to the bathroom and then comes back and puts his machine on the pressure seems too low.  His download is below     12/04/21: James Rodgers is a 73 year old male with a history of obstructive sleep apnea on CPAP.  He returns today for follow-up.  His download is below.  Reports that the CPAP is working well.  He states he has had the CPAP for quite a while and will probably need a new one in the future.  He is not interested in a new machine today.   11/15/20: James Rodgers is a 73 year old male with a history of obstructive sleep apnea on CPAP.  He reports that the CPAP is working well.  He reports that he is due for new machine but his is working fine therefore he is okay with  keeping his current machine.  He returns today for an evaluation.       HISTORY 12/06/19:   James Rodgers is a 73 year old male with a history of obstructive sleep apnea on CPAP.  His download indicates that he uses machine 29 out of 30 days for compliance of 97%.  He uses machine greater than 4 hours each night.  On average he uses his machine 7 hours and 34 minutes.  His residual AHI is 0.9 on 8 to 15 cm of water  with EPR of three.  Leak in the 95th percentile is 12.5 L/min.  Reports that his CPAP is working well for him.  He also has a travel machine that he uses.  He returns today for an evaluation.   REVIEW OF SYSTEMS: Out of a complete 14 system review of symptoms, the patient complains only of the following symptoms, and all other reviewed systems are negative.  ALLERGIES: Allergies  Allergen Reactions   Cipro [Ciprofloxacin Hcl]     Due to thoracic anuerysm   Codeine Other (See Comments)    dizzy    HOME MEDICATIONS: Outpatient Medications Prior to Visit  Medication Sig Dispense Refill   amLODipine (NORVASC) 5 MG tablet Take 5 mg by mouth daily.     atorvastatin  (LIPITOR) 40 MG tablet Take 40 mg by mouth daily.     clopidogrel  (PLAVIX ) 75 MG  tablet Take 75 mg by mouth daily.     docusate sodium  (COLACE) 100 MG capsule Take 100 mg by mouth at bedtime.     esomeprazole (NEXIUM) 40 MG capsule Take 40 mg by mouth daily.     FARXIGA 10 MG TABS tablet Take 10 mg by mouth daily.     fexofenadine (ALLEGRA) 180 MG tablet Take 180 mg by mouth daily as needed (for allergies).     FIBER PO Take 1 capsule by mouth 2 (two) times daily.     fluticasone (FLONASE) 50 MCG/ACT nasal spray Place 2 sprays into both nostrils daily as needed for allergies or rhinitis.     metFORMIN  (GLUCOPHAGE -XR) 500 MG 24 hr tablet Take 500 mg by mouth 2 (two) times daily.      nitroGLYCERIN (NITROLINGUAL) 0.4 MG/SPRAY spray      olmesartan  (BENICAR ) 20 MG tablet Take 1 tablet (20 mg total) by mouth daily.  90 tablet 2   Semaglutide (OZEMPIC, 2 MG/DOSE, [place]) Inject 2 mg into the skin once a week.     tamsulosin  (FLOMAX ) 0.4 MG CAPS capsule Take 0.4 mg by mouth at bedtime.      No facility-administered medications prior to visit.    PAST MEDICAL HISTORY: Past Medical History:  Diagnosis Date   Adenomatous colon polyp    Anal fissure    Arthritis    Basal cell carcinoma    neck   CAD (coronary artery disease)    a. 2003 Cath: LAD 50, D1 50-60, RCA 40;  b. 12/2008 MV: no ischemia/infarct, EF 63%. // c. MV 8/17: mild inf-sept and apical ischemia, EF 53%, Low Risk   DDD (degenerative disc disease), lumbar    Diabetes type 2, controlled (HCC)    GERD (gastroesophageal reflux disease)    Hemorrhoids    History of dizziness    History of nuclear stress test    a. Myoview  8/17: EF 53%, mild inferoseptal and mild apical ischemia, low risk   HLD (hyperlipidemia)    HTN (hypertension)    Internal hemorrhoids    LBP (low back pain)    a. h/o surgery in 1991.   Obesity    Orthostatic lightheadedness    with heat exposure   Osteoarthritis    knees   Perineal abscess 09/2012   Pneumonia 09/2005   PONV (postoperative nausea and vomiting)    Renal cyst, right    Retinopathy    Mild, left eye   Right ureteral stone    Seasonal allergic rhinitis    Sleep apnea    a. on cpap.   Spondylosis    lumbar   Thoracic aortic aneurysm (HCC)    a. MRA 5/17 ascending thoracic aorta measuring 40 mm - recommend annual follow-up // b.  Echo 5/17: Moderate concentric LVH, EF 55-60%, normal wall motion, grade 1 diastolic dysfunction, ascending aortic diameter 45 mm, mild MR, mild LAE // c. Chest MRA 6/18: stable ascending aortic aneurysm measuring 4.1 cm (4.0 cm in 5/17). FU 1 year.    PAST SURGICAL HISTORY: Past Surgical History:  Procedure Laterality Date   anal fissure surgery  1981   CARDIAC CATHETERIZATION  2004   1 vessel CAD   COLONOSCOPY  2003,2011   KNEE ARTHROSCOPY Left    LASIK      LUMBAR SPINE SURGERY  1990   x2   NASAL SINUS SURGERY  2001   NASAL SINUS SURGERY  2002   TOTAL KNEE ARTHROPLASTY Left 09/12/2017   Procedure: LEFT  TOTAL KNEE ARTHROPLASTY;  Surgeon: Vernetta Lonni GRADE, MD;  Location: [facility];  Service: Orthopedics;  Laterality: Left;    FAMILY HISTORY: Family History  Problem Relation Age of Onset   Heart disease Father    Hypertension Father    Uterine cancer Mother        ?   Diabetes Other        GM   Diabetes Paternal Grandmother    Diabetes Sister    Lung cancer Paternal Uncle    Cancer Paternal Aunt        type unknown   Colon cancer Neg Hx    Esophageal cancer Neg Hx    Stomach cancer Neg Hx    Rectal cancer Neg Hx     SOCIAL HISTORY: Social History   Socioeconomic History   Marital status: Married    Spouse name: Not on file   Number of children: 2   Years of education: Not on file   Highest education level: Not on file  Occupational History   Occupation: retired Theatre stage manager  Tobacco Use   Smoking status: Never   Smokeless tobacco: Never  Vaping Use   Vaping status: Never Used  Substance and Sexual Activity   Alcohol use: No   Drug use: No   Sexual activity: Not on file  Other Topics Concern   Not on file  Social History Narrative   Married; retired Company secretary; daily caffeine use.  Lives with wife in [place]. Goes to the gym about 2 times a week.   Social Drivers of Corporate investment banker Strain: Not on file  Food Insecurity: Not on file  Transportation Needs: Not on file  Physical Activity: Not on file  Stress: Not on file  Social Connections: Not on file  Intimate Partner Violence: Not on file      PHYSICAL EXAM Generalized: Well developed, in no acute distress   Neurological examination  Mentation: Alert oriented to time, place, history taking.  speech and language fluent   DIAGNOSTIC DATA (LABS, IMAGING, TESTING) - I reviewed patient records, labs, notes, testing and imaging myself where  available.  Lab Results  Component Value Date   WBC 6.6 03/21/2020   HGB 16.5 03/21/2020   HCT 49.0 03/21/2020   MCV 82 03/21/2020   PLT 209 03/21/2020      Component Value Date/Time   NA 139 03/21/2020 1051   K 4.6 03/21/2020 1051   CL 103 03/21/2020 1051   CO2 20 03/21/2020 1051   GLUCOSE 139 (H) 03/21/2020 1051   GLUCOSE 168 (H) 09/13/2017 0505   BUN 18 03/21/2020 1051   CREATININE 0.98 03/21/2020 1051   CALCIUM  9.9 03/21/2020 1051   PROT 7.4 03/21/2020 1051   ALBUMIN 4.7 03/21/2020 1051   AST 25 03/21/2020 1051   ALT 46 (H) 03/21/2020 1051   ALKPHOS 90 03/21/2020 1051   BILITOT 0.7 03/21/2020 1051   GFRNONAA 78 03/21/2020 1051   GFRAA 90 03/21/2020 1051   Lab Results  Component Value Date   CHOL 120 03/21/2020   HDL 39 (L) 03/21/2020   LDLCALC 57 03/21/2020   TRIG 138 03/21/2020   CHOLHDL 3.1 03/21/2020   Lab Results  Component Value Date   HGBA1C 7.5 (H) 03/21/2020   No results found for: [id number] Lab Results  Component Value Date   TSH 2.800 03/23/2019      ASSESSMENT AND PLAN 73 y.o. year old male  has a past medical history of Adenomatous colon polyp,  Anal fissure, Arthritis, Basal cell carcinoma, CAD (coronary artery disease), DDD (degenerative disc disease), lumbar, Diabetes type 2, controlled (HCC), GERD (gastroesophageal reflux disease), Hemorrhoids, History of dizziness, History of nuclear stress test, HLD (hyperlipidemia), HTN (hypertension), Internal hemorrhoids, LBP (low back pain), Obesity, Orthostatic lightheadedness, Osteoarthritis, Perineal abscess (09/2012), Pneumonia (09/2005), PONV (postoperative nausea and vomiting), Renal cyst, right, Retinopathy, Right ureteral stone, Seasonal allergic rhinitis, Sleep apnea, Spondylosis, and Thoracic aortic aneurysm (HCC). here with:  OSA on CPAP  CPAP compliance excellent Residual AHI is good Encouraged patient to continue using CPAP nightly and > 4 hours each night F/U in 1 year or sooner if  needed    James Russell, MSN, NP-C 11/25/2023, 1:41 PM [facility] [address] [place], [place] [address] [phone]  The patient's condition requires frequent monitoring and adjustments in the treatment plan, reflecting the ongoing complexity of care.  This provider is the continuing focal point for all needed services for this condition.

## 2023-12-02 ENCOUNTER — Ambulatory Visit (HOSPITAL_COMMUNITY)
Admission: RE | Admit: 2023-12-02 | Discharge: 2023-12-02 | Disposition: A | Discharge status: Home / Self Care | Source: Ambulatory Visit | Attending: Surgery | Admitting: Surgery

## 2023-12-02 DIAGNOSIS — I7121 Aneurysm of the ascending aorta, without rupture: Secondary | ICD-10-CM | POA: Diagnosis present

## 2023-12-02 DIAGNOSIS — I2510 Atherosclerotic heart disease of native coronary artery without angina pectoris: Secondary | ICD-10-CM | POA: Insufficient documentation

## 2023-12-02 DIAGNOSIS — R918 Other nonspecific abnormal finding of lung field: Secondary | ICD-10-CM | POA: Insufficient documentation

## 2023-12-02 DIAGNOSIS — I700 Atherosclerosis of aorta: Secondary | ICD-10-CM | POA: Diagnosis not present

## 2023-12-02 LAB — POCT I-STAT CREATININE: Creatinine, Ser: 1 mg/dL (ref 0.61–1.24)

## 2023-12-02 MED ORDER — IOHEXOL 350 MG/ML SOLN
80.0000 mL | Freq: Once | INTRAVENOUS | Status: AC | PRN
  Administered 2023-12-02: 80 mL via INTRAVENOUS

## 2023-12-09 ENCOUNTER — Ambulatory Visit

## 2023-12-09 VITALS — BP 115/67 | HR 78 | Resp 20 | Ht 77.0 in | Wt 288.3 lb

## 2023-12-09 DIAGNOSIS — I7121 Aneurysm of the ascending aorta, without rupture: Secondary | ICD-10-CM | POA: Diagnosis not present

## 2023-12-09 NOTE — Patient Instructions (Signed)

## 2023-12-09 NOTE — Progress Notes (Signed)
 //      [address] [place] [address]             [phone]            James Rodgers [id number] 1951/02/10   History of Present Illness:  James Rodgers is a 73 year old man with medical history of hypertension, coronary artery disease, OSA, type 2 diabetes and hyperlipidemia who presents for continued surveillance of ascending thoracic aortic aneurysm. This has been followed since 2017.  Aneurysm has stayed stable measuring 4.3 cm - 4.5 cm. Echocardiogram in 2017 showed tri leaflet aortic valve. CTA of chest on 12/02/2023 measured aneurysm at 4.5 cm.   He presents to the clinic today with his wife.  His blood pressure is well controlled with current medication therapy.  He does report hypotension while golfing in the heat.  He was recommended by his cardiologist to hold farxiga on days he is golfing while it is hot.  He states that the hypotension does not occur when the temperature is cooler.  He denies chest pain, shortness of breath and lower leg swelling.     Current Outpatient Medications on File Prior to Visit  Medication Sig Dispense Refill   amLODipine (NORVASC) 5 MG tablet Take 5 mg by mouth daily.     atorvastatin  (LIPITOR) 40 MG tablet Take 40 mg by mouth daily.     clopidogrel  (PLAVIX ) 75 MG tablet Take 75 mg by mouth daily.     docusate sodium  (COLACE) 100 MG capsule Take 100 mg by mouth at bedtime.     esomeprazole (NEXIUM) 40 MG capsule Take 40 mg by mouth daily.     FARXIGA 10 MG TABS tablet Take 10 mg by mouth daily.     fexofenadine (ALLEGRA) 180 MG tablet Take 180 mg by mouth daily as needed (for allergies).     FIBER PO Take 1 capsule by mouth 2 (two) times daily.     fluticasone (FLONASE) 50 MCG/ACT nasal spray Place 2 sprays into both nostrils daily as needed for allergies or rhinitis.     metFORMIN  (GLUCOPHAGE -XR) 500 MG 24 hr tablet Take 500 mg by mouth 2 (two) times daily.      nitroGLYCERIN (NITROLINGUAL) 0.4 MG/SPRAY  spray      olmesartan  (BENICAR ) 20 MG tablet Take 1 tablet (20 mg total) by mouth daily. 90 tablet 2   Semaglutide (OZEMPIC, 2 MG/DOSE, [place]) Inject 2 mg into the skin once a week.     tamsulosin  (FLOMAX ) 0.4 MG CAPS capsule Take 0.4 mg by mouth at bedtime.      No current facility-administered medications on file prior to visit.     ROS: Review of Systems  Constitutional: Negative.  Negative for malaise/fatigue.  Respiratory: Negative.  Negative for cough and shortness of breath.   Cardiovascular: Negative.  Negative for chest pain and leg swelling.     BP 115/67 (BP Location: Left Arm, Patient Position: Sitting, Cuff Size: Large)   Pulse 78   Resp 20   Ht 6' 5 (1.956 m)   Wt 288 lb 4.8 oz (130.8 kg)   SpO2 96% Comment: RA  BMI 34.19 kg/m   Physical Exam Constitutional:      Appearance: Normal appearance.  HENT:     Head: Normocephalic and atraumatic.  Skin:    General: Skin is warm and dry.  Neurological:     General: No focal deficit present.  Mental Status: He is alert and oriented to person, place, and time.      Imaging: CLINICAL DATA:  Thoracic aortic aneurysm   EXAM: CT ANGIOGRAPHY CHEST WITH CONTRAST   TECHNIQUE: Multidetector CT imaging of the chest was performed using the standard protocol during bolus administration of intravenous contrast. Multiplanar CT image reconstructions and MIPs were obtained to evaluate the vascular anatomy.   RADIATION DOSE REDUCTION: This exam was performed according to the departmental dose-optimization program which includes automated exposure control, adjustment of the mA and/or kV according to patient size and/or use of iterative reconstruction technique.   CONTRAST:  80mL OMNIPAQUE  IOHEXOL  350 MG/ML SOLN   COMPARISON:  CTA chest dated 12/04/2022   FINDINGS: Cardiovascular:   Aortic Root:   --Valve: 3.2 x 2.7 cm   --Sinuses: 3.9 x 3.8 x 3.7 cm   --Sinotubular Junction: 3.5 x 3.5 cm   Limitations by  motion: None   Thoracic Aorta:   --Ascending Aorta: 4.5 x 4.4 cm, unchanged when remeasured   --Aortic Arch: 4.0 x 3.9 cm   --Descending Aorta: 3.1 x 3.1 cm   Other:   Normal heart size. No pericardial effusion. Coronary artery calcifications. Minimal abdominal aortic atherosclerosis. No central pulmonary embolism.   Mediastinum/Nodes: Imaged thyroid gland without nodules meeting criteria for imaging follow-up by size. Normal esophagus. No pathologically enlarged axillary, supraclavicular, mediastinal, or hilar lymph nodes.   Lungs/Pleura: The central airways are patent. Unchanged scarring in a branching, tree-in-bud pattern involving the right middle lobe and peripheral bilateral lower lobes. Unchanged scattered pulmonary nodules. Index nodules:   -4 mm peribronchovascular left upper lobe (303:76)   -8 x 5 mm peripheral right middle lobe (303:81)   -3 mm peripheral left lower lobe (303:108)   Additional triangular perifissural right middle lobe nodules (303: 65, 67), likely perifissural lymph nodes. No pneumothorax. No pleural effusion.   Upper abdomen: Normal.   Musculoskeletal: No acute or abnormal lytic or blastic osseous lesions. Multilevel degenerative changes of the thoracic spine.   IMPRESSION: 1. Unchanged ascending aortic aneurysm measuring up to 4.5 cm. 2. Unchanged scattered pulmonary nodules measuring up to 8 mm. 3. Aortic Atherosclerosis (ICD10-I70.0). Coronary artery calcifications. Assessment for potential risk factor modification, dietary therapy or pharmacologic therapy may be warranted, if clinically indicated.     Electronically Signed   By: Limin  Xu M.D.   On: 12/02/2023 10:14    A/P: Aneurysm of ascending aorta without rupture -4.5 cm ascending thoracic aortic aneurysm on CTA of chest. 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 We discussed the natural history and and risk factors for growth of ascending aortic aneurysms.  Discussed recommendations to minimize the risk of further expansion or dissection including careful blood pressure control, avoidance of contact sports and heavy lifting, attention to lipid management.  We covered the importance of staying never user of tobacco.  The patient does not yet meet surgical criteria of >5.5cm. The patient is aware of signs and symptoms of aortic dissection and when to present to the emergency department   -Follow up in one year with CTA of chest for continued surveillance   Pulmonary Nodules -Unchanged in size, will continue to monitor with yearly CT scans for aneurysm    Risk Modification:  Statin:  atorvastatin    Smoking cessation instruction/counseling given:  never user  Patient was counseled on importance of Blood Pressure Control  They are instructed to contact their Primary Care Physician if they start to have blood pressure readings over 130s/90s. Do  not ever stop blood pressure medications on your own, unless instructed by healthcare professional.  Please avoid use of Fluoroquinolones as this can potentially increase your risk of Aortic Rupture and/or Dissection  Patient educated on signs and symptoms of Aortic Dissection, handout also provided in AVS  Manuelita CHRISTELLA Rough, PA-C 12/09/23

## 2024-04-29 ENCOUNTER — Ambulatory Visit: Admitting: Cardiology

## 2024-11-23 ENCOUNTER — Telehealth: Admitting: Adult Health
